# Patient Record
Sex: Male | Born: 1960 | Race: White | Hispanic: No | Marital: Single | Smoking: Never smoker
Health system: Southern US, Community
[De-identification: ages and names within clinical notes are randomized; demographics above are authoritative.]

## PROBLEM LIST (undated history)

## (undated) ENCOUNTER — Ambulatory Visit: Admission: EM | Payer: Medicare Other | Source: Home / Self Care

## (undated) DIAGNOSIS — F209 Schizophrenia, unspecified: Secondary | ICD-10-CM

## (undated) DIAGNOSIS — I10 Essential (primary) hypertension: Secondary | ICD-10-CM

---

## 2004-10-26 ENCOUNTER — Emergency Department: Payer: Self-pay | Admitting: Unknown Physician Specialty

## 2005-03-29 ENCOUNTER — Emergency Department: Payer: Self-pay | Admitting: Emergency Medicine

## 2005-06-23 ENCOUNTER — Inpatient Hospital Stay: Payer: Self-pay | Admitting: Unknown Physician Specialty

## 2009-12-27 ENCOUNTER — Inpatient Hospital Stay: Payer: Self-pay | Admitting: Surgery

## 2010-01-27 ENCOUNTER — Ambulatory Visit: Payer: Self-pay | Admitting: Surgery

## 2010-03-07 ENCOUNTER — Ambulatory Visit: Payer: Self-pay | Admitting: Gastroenterology

## 2014-10-07 DIAGNOSIS — E221 Hyperprolactinemia: Secondary | ICD-10-CM | POA: Insufficient documentation

## 2014-10-07 DIAGNOSIS — Z86010 Personal history of colonic polyps: Secondary | ICD-10-CM | POA: Insufficient documentation

## 2015-10-13 ENCOUNTER — Ambulatory Visit
Admission: RE | Admit: 2015-10-13 | Discharge: 2015-10-13 | Disposition: A | Discharge status: Home / Self Care | Payer: Medicare Other | Source: Ambulatory Visit | Attending: Psychiatry | Admitting: Psychiatry

## 2015-10-13 ENCOUNTER — Other Ambulatory Visit: Payer: Self-pay

## 2015-10-13 DIAGNOSIS — R002 Palpitations: Secondary | ICD-10-CM | POA: Diagnosis not present

## 2016-02-13 DIAGNOSIS — Z79899 Other long term (current) drug therapy: Secondary | ICD-10-CM | POA: Insufficient documentation

## 2016-03-23 ENCOUNTER — Other Ambulatory Visit
Admission: RE | Admit: 2016-03-23 | Discharge: 2016-03-23 | Disposition: A | Discharge status: Home / Self Care | Payer: Medicare Other | Source: Ambulatory Visit | Attending: Child & Adolescent Psychiatry | Admitting: Child & Adolescent Psychiatry

## 2016-03-23 DIAGNOSIS — F209 Schizophrenia, unspecified: Secondary | ICD-10-CM | POA: Diagnosis present

## 2016-03-24 LAB — PROLACTIN: Prolactin: 19.6 ng/mL — ABNORMAL HIGH (ref 4.0–15.2)

## 2017-02-26 DIAGNOSIS — R0602 Shortness of breath: Secondary | ICD-10-CM | POA: Insufficient documentation

## 2017-02-26 DIAGNOSIS — R002 Palpitations: Secondary | ICD-10-CM | POA: Insufficient documentation

## 2017-03-14 DIAGNOSIS — K859 Acute pancreatitis without necrosis or infection, unspecified: Secondary | ICD-10-CM | POA: Insufficient documentation

## 2017-03-14 DIAGNOSIS — I10 Essential (primary) hypertension: Secondary | ICD-10-CM | POA: Insufficient documentation

## 2017-03-16 DIAGNOSIS — R4183 Borderline intellectual functioning: Secondary | ICD-10-CM | POA: Insufficient documentation

## 2018-02-11 DIAGNOSIS — E559 Vitamin D deficiency, unspecified: Secondary | ICD-10-CM | POA: Insufficient documentation

## 2018-02-11 DIAGNOSIS — R7989 Other specified abnormal findings of blood chemistry: Secondary | ICD-10-CM | POA: Insufficient documentation

## 2018-11-20 ENCOUNTER — Encounter: Admission: RE | Payer: Self-pay | Source: Ambulatory Visit

## 2018-11-20 ENCOUNTER — Ambulatory Visit: Admission: RE | Admit: 2018-11-20 | Payer: Medicare Other | Source: Ambulatory Visit | Admitting: Gastroenterology

## 2018-11-20 SURGERY — COLONOSCOPY WITH PROPOFOL
Anesthesia: General

## 2019-02-12 DIAGNOSIS — R251 Tremor, unspecified: Secondary | ICD-10-CM | POA: Insufficient documentation

## 2019-04-25 ENCOUNTER — Other Ambulatory Visit: Payer: Self-pay

## 2019-04-25 ENCOUNTER — Emergency Department: Payer: Medicare Other

## 2019-04-25 ENCOUNTER — Encounter
Admission: EM | Disposition: A | Discharge status: Skilled Nursing Facility | Payer: Self-pay | Source: Home / Self Care | Attending: Surgery

## 2019-04-25 ENCOUNTER — Emergency Department: Payer: Medicare Other | Admitting: Anesthesiology

## 2019-04-25 ENCOUNTER — Inpatient Hospital Stay
Admission: EM | Admit: 2019-04-25 | Discharge: 2019-04-28 | DRG: 501 | Disposition: A | Discharge status: Skilled Nursing Facility | Payer: Medicare Other | Attending: Surgery | Admitting: Surgery

## 2019-04-25 DIAGNOSIS — T796XXA Traumatic ischemia of muscle, initial encounter: Secondary | ICD-10-CM | POA: Diagnosis present

## 2019-04-25 DIAGNOSIS — R000 Tachycardia, unspecified: Secondary | ICD-10-CM | POA: Diagnosis not present

## 2019-04-25 DIAGNOSIS — Z20828 Contact with and (suspected) exposure to other viral communicable diseases: Secondary | ICD-10-CM | POA: Diagnosis present

## 2019-04-25 DIAGNOSIS — Z79899 Other long term (current) drug therapy: Secondary | ICD-10-CM | POA: Diagnosis not present

## 2019-04-25 DIAGNOSIS — R0902 Hypoxemia: Secondary | ICD-10-CM | POA: Diagnosis not present

## 2019-04-25 DIAGNOSIS — R0603 Acute respiratory distress: Secondary | ICD-10-CM | POA: Diagnosis not present

## 2019-04-25 DIAGNOSIS — E8770 Fluid overload, unspecified: Secondary | ICD-10-CM | POA: Diagnosis not present

## 2019-04-25 DIAGNOSIS — F209 Schizophrenia, unspecified: Secondary | ICD-10-CM | POA: Diagnosis present

## 2019-04-25 DIAGNOSIS — Z888 Allergy status to other drugs, medicaments and biological substances status: Secondary | ICD-10-CM

## 2019-04-25 DIAGNOSIS — S76111A Strain of right quadriceps muscle, fascia and tendon, initial encounter: Principal | ICD-10-CM | POA: Diagnosis present

## 2019-04-25 DIAGNOSIS — W109XXA Fall (on) (from) unspecified stairs and steps, initial encounter: Secondary | ICD-10-CM | POA: Diagnosis present

## 2019-04-25 DIAGNOSIS — I10 Essential (primary) hypertension: Secondary | ICD-10-CM | POA: Diagnosis present

## 2019-04-25 DIAGNOSIS — R0602 Shortness of breath: Secondary | ICD-10-CM

## 2019-04-25 DIAGNOSIS — J9811 Atelectasis: Secondary | ICD-10-CM | POA: Diagnosis not present

## 2019-04-25 HISTORY — DX: Essential (primary) hypertension: I10

## 2019-04-25 HISTORY — PX: QUADRICEPS TENDON REPAIR: SHX756

## 2019-04-25 LAB — CBC WITH DIFFERENTIAL/PLATELET
Abs Immature Granulocytes: 0.1 10*3/uL — ABNORMAL HIGH (ref 0.00–0.07)
Basophils Absolute: 0 10*3/uL (ref 0.0–0.1)
Basophils Relative: 0 %
Eosinophils Absolute: 0.2 10*3/uL (ref 0.0–0.5)
Eosinophils Relative: 2 %
HCT: 44.8 % (ref 39.0–52.0)
Hemoglobin: 14.9 g/dL (ref 13.0–17.0)
Immature Granulocytes: 1 %
Lymphocytes Relative: 13 %
Lymphs Abs: 1.1 10*3/uL (ref 0.7–4.0)
MCH: 31.5 pg (ref 26.0–34.0)
MCHC: 33.3 g/dL (ref 30.0–36.0)
MCV: 94.7 fL (ref 80.0–100.0)
Monocytes Absolute: 1 10*3/uL (ref 0.1–1.0)
Monocytes Relative: 13 %
Neutro Abs: 5.5 10*3/uL (ref 1.7–7.7)
Neutrophils Relative %: 71 %
Platelets: 134 10*3/uL — ABNORMAL LOW (ref 150–400)
RBC: 4.73 MIL/uL (ref 4.22–5.81)
RDW: 13.9 % (ref 11.5–15.5)
WBC: 7.9 10*3/uL (ref 4.0–10.5)
nRBC: 0 % (ref 0.0–0.2)

## 2019-04-25 LAB — URINALYSIS, COMPLETE (UACMP) WITH MICROSCOPIC
Bacteria, UA: NONE SEEN
Bilirubin Urine: NEGATIVE
Glucose, UA: NEGATIVE mg/dL
Hgb urine dipstick: NEGATIVE
Ketones, ur: NEGATIVE mg/dL
Leukocytes,Ua: NEGATIVE
Nitrite: NEGATIVE
Protein, ur: NEGATIVE mg/dL
Specific Gravity, Urine: 1.027 (ref 1.005–1.030)
pH: 5 (ref 5.0–8.0)

## 2019-04-25 LAB — BASIC METABOLIC PANEL
Anion gap: 8 (ref 5–15)
BUN: 18 mg/dL (ref 6–20)
CO2: 30 mmol/L (ref 22–32)
Calcium: 9 mg/dL (ref 8.9–10.3)
Chloride: 98 mmol/L (ref 98–111)
Creatinine, Ser: 1.1 mg/dL (ref 0.61–1.24)
GFR calc Af Amer: >60 mL/min (ref >60)
GFR calc non Af Amer: >60 mL/min (ref >60)
Glucose, Bld: 110 mg/dL — ABNORMAL HIGH (ref 70–99)
Potassium: 4.6 mmol/L (ref 3.5–5.1)
Sodium: 136 mmol/L (ref 135–145)

## 2019-04-25 LAB — SARS CORONAVIRUS 2 BY RT PCR (HOSPITAL ORDER, PERFORMED IN ~~LOC~~ HOSPITAL LAB): SARS Coronavirus 2: NEGATIVE

## 2019-04-25 LAB — CK: Total CK: 528 U/L — ABNORMAL HIGH (ref 49–397)

## 2019-04-25 SURGERY — REPAIR, TENDON, QUADRICEPS
Anesthesia: General | Site: Knee | Laterality: Right

## 2019-04-25 MED ORDER — ACETAMINOPHEN 10 MG/ML IV SOLN
INTRAVENOUS | Status: DC | PRN
  Administered 2019-04-25: 1000 mg via INTRAVENOUS

## 2019-04-25 MED ORDER — FENTANYL CITRATE (PF) 100 MCG/2ML IJ SOLN: 25.0000 ug | INTRAMUSCULAR | Status: DC | PRN

## 2019-04-25 MED ORDER — SODIUM CHLORIDE 0.9 % IV BOLUS
1000.0000 mL | Freq: Once | INTRAVENOUS | Status: AC
  Administered 2019-04-25: 12:00:00 1000 mL via INTRAVENOUS

## 2019-04-25 MED ORDER — SUCCINYLCHOLINE CHLORIDE 20 MG/ML IJ SOLN
INTRAMUSCULAR | Status: DC | PRN
  Administered 2019-04-25: 120 mg via INTRAVENOUS

## 2019-04-25 MED ORDER — BENZTROPINE MESYLATE 1 MG PO TABS
1.0000 mg | ORAL_TABLET | Freq: Two times a day (BID) | ORAL | Status: DC
  Administered 2019-04-25 – 2019-04-28 (×5): 1 mg via ORAL
  Filled 2019-04-25 (×7): qty 1

## 2019-04-25 MED ORDER — NALOXONE HCL 0.4 MG/ML IJ SOLN
INTRAMUSCULAR | Status: DC | PRN
  Administered 2019-04-25: 0.1 mg via INTRAVENOUS

## 2019-04-25 MED ORDER — BUPIVACAINE HCL (PF) 0.5 % IJ SOLN
INTRAMUSCULAR | Status: AC
  Filled 2019-04-25: qty 30

## 2019-04-25 MED ORDER — ONDANSETRON HCL 4 MG PO TABS: 4.0000 mg | ORAL_TABLET | Freq: Four times a day (QID) | ORAL | Status: DC | PRN

## 2019-04-25 MED ORDER — SODIUM CHLORIDE 0.9 % IV SOLN
INTRAVENOUS | Status: DC
  Administered 2019-04-25 – 2019-04-26 (×3): via INTRAVENOUS

## 2019-04-25 MED ORDER — DIVALPROEX SODIUM 500 MG PO DR TAB
1000.0000 mg | DELAYED_RELEASE_TABLET | Freq: Two times a day (BID) | ORAL | Status: DC
  Administered 2019-04-25 – 2019-04-28 (×6): 1000 mg via ORAL
  Filled 2019-04-25 (×7): qty 2

## 2019-04-25 MED ORDER — DIPHENHYDRAMINE HCL 12.5 MG/5ML PO ELIX
12.5000 mg | ORAL_SOLUTION | ORAL | Status: DC | PRN
  Filled 2019-04-25: qty 10

## 2019-04-25 MED ORDER — BISACODYL 10 MG RE SUPP
10.0000 mg | Freq: Every day | RECTAL | Status: DC | PRN
  Filled 2019-04-25: qty 1

## 2019-04-25 MED ORDER — ROCURONIUM BROMIDE 100 MG/10ML IV SOLN
INTRAVENOUS | Status: DC | PRN
  Administered 2019-04-25: 5 mg via INTRAVENOUS

## 2019-04-25 MED ORDER — FLUMAZENIL 0.5 MG/5ML IV SOLN
INTRAVENOUS | Status: DC | PRN
  Administered 2019-04-25: 0.2 mg via INTRAVENOUS

## 2019-04-25 MED ORDER — DEXAMETHASONE SODIUM PHOSPHATE 10 MG/ML IJ SOLN
INTRAMUSCULAR | Status: DC | PRN
  Administered 2019-04-25: 10 mg via INTRAVENOUS

## 2019-04-25 MED ORDER — HYDROMORPHONE HCL 1 MG/ML IJ SOLN: 0.2500 mg | INTRAMUSCULAR | Status: DC | PRN

## 2019-04-25 MED ORDER — ONDANSETRON HCL 4 MG/2ML IJ SOLN: 4.0000 mg | Freq: Once | INTRAMUSCULAR | Status: DC | PRN

## 2019-04-25 MED ORDER — TRAMADOL HCL 50 MG PO TABS: 50.0000 mg | ORAL_TABLET | Freq: Four times a day (QID) | ORAL | Status: DC | PRN

## 2019-04-25 MED ORDER — KETOROLAC TROMETHAMINE 15 MG/ML IJ SOLN
15.0000 mg | Freq: Four times a day (QID) | INTRAMUSCULAR | Status: AC
  Administered 2019-04-26 (×3): 15 mg via INTRAVENOUS
  Filled 2019-04-25 (×4): qty 1

## 2019-04-25 MED ORDER — FENTANYL CITRATE (PF) 100 MCG/2ML IJ SOLN
INTRAMUSCULAR | Status: AC
  Filled 2019-04-25: qty 2

## 2019-04-25 MED ORDER — MIDAZOLAM HCL 2 MG/2ML IJ SOLN
INTRAMUSCULAR | Status: DC | PRN
  Administered 2019-04-25: 2 mg via INTRAVENOUS

## 2019-04-25 MED ORDER — PROPOFOL 10 MG/ML IV BOLUS
INTRAVENOUS | Status: DC | PRN
  Administered 2019-04-25: 160 mg via INTRAVENOUS

## 2019-04-25 MED ORDER — PROPOFOL 10 MG/ML IV BOLUS
INTRAVENOUS | Status: AC
  Filled 2019-04-25: qty 20

## 2019-04-25 MED ORDER — FLEET ENEMA 7-19 GM/118ML RE ENEM: 1.0000 | ENEMA | Freq: Once | RECTAL | Status: DC | PRN

## 2019-04-25 MED ORDER — ONDANSETRON HCL 4 MG/2ML IJ SOLN
INTRAMUSCULAR | Status: AC
  Filled 2019-04-25: qty 2

## 2019-04-25 MED ORDER — LORAZEPAM 0.5 MG PO TABS
0.5000 mg | ORAL_TABLET | Freq: Every morning | ORAL | Status: DC
  Administered 2019-04-26 – 2019-04-28 (×3): 0.5 mg via ORAL
  Filled 2019-04-25 (×3): qty 1

## 2019-04-25 MED ORDER — OXYCODONE HCL 5 MG PO TABS
5.0000 mg | ORAL_TABLET | ORAL | Status: DC | PRN
  Administered 2019-04-26 – 2019-04-27 (×2): 10 mg via ORAL
  Filled 2019-04-25: qty 2
  Filled 2019-04-25: qty 1
  Filled 2019-04-25: qty 2

## 2019-04-25 MED ORDER — ENOXAPARIN SODIUM 40 MG/0.4ML ~~LOC~~ SOLN
40.0000 mg | SUBCUTANEOUS | Status: DC
  Administered 2019-04-26 – 2019-04-28 (×3): 40 mg via SUBCUTANEOUS
  Filled 2019-04-25 (×3): qty 0.4

## 2019-04-25 MED ORDER — BENZTROPINE MESYLATE 0.5 MG PO TABS
0.5000 mg | ORAL_TABLET | Freq: Every day | ORAL | Status: DC
  Administered 2019-04-26 – 2019-04-28 (×4): 0.5 mg via ORAL
  Filled 2019-04-25 (×4): qty 1

## 2019-04-25 MED ORDER — DOCUSATE SODIUM 100 MG PO CAPS
100.0000 mg | ORAL_CAPSULE | Freq: Two times a day (BID) | ORAL | Status: DC
  Administered 2019-04-25 – 2019-04-27 (×3): 100 mg via ORAL
  Filled 2019-04-25 (×5): qty 1

## 2019-04-25 MED ORDER — METOCLOPRAMIDE HCL 5 MG PO TABS: 5.0000 mg | ORAL_TABLET | Freq: Three times a day (TID) | ORAL | Status: DC | PRN

## 2019-04-25 MED ORDER — METRONIDAZOLE 0.75 % EX GEL
1.0000 "application " | Freq: Every day | CUTANEOUS | Status: DC
  Administered 2019-04-26 – 2019-04-28 (×3): 1 via TOPICAL
  Filled 2019-04-25: qty 45

## 2019-04-25 MED ORDER — PROPRANOLOL HCL 20 MG PO TABS
20.0000 mg | ORAL_TABLET | Freq: Every day | ORAL | Status: DC
  Administered 2019-04-27 – 2019-04-28 (×2): 20 mg via ORAL
  Filled 2019-04-25 (×4): qty 1

## 2019-04-25 MED ORDER — CEFAZOLIN SODIUM-DEXTROSE 2-4 GM/100ML-% IV SOLN
2.0000 g | Freq: Four times a day (QID) | INTRAVENOUS | Status: AC
  Administered 2019-04-26 (×2): 2 g via INTRAVENOUS
  Filled 2019-04-25 (×3): qty 100

## 2019-04-25 MED ORDER — ROCURONIUM BROMIDE 50 MG/5ML IV SOLN
INTRAVENOUS | Status: AC
  Filled 2019-04-25: qty 1

## 2019-04-25 MED ORDER — LIDOCAINE HCL (CARDIAC) PF 100 MG/5ML IV SOSY
PREFILLED_SYRINGE | INTRAVENOUS | Status: DC | PRN
  Administered 2019-04-25: 40 mg via INTRAVENOUS

## 2019-04-25 MED ORDER — SEVOFLURANE IN SOLN
RESPIRATORY_TRACT | Status: AC
  Filled 2019-04-25: qty 250

## 2019-04-25 MED ORDER — SUGAMMADEX SODIUM 500 MG/5ML IV SOLN
INTRAVENOUS | Status: DC | PRN
  Administered 2019-04-25: 250 mg via INTRAVENOUS

## 2019-04-25 MED ORDER — ONDANSETRON HCL 4 MG/2ML IJ SOLN
INTRAMUSCULAR | Status: DC | PRN
  Administered 2019-04-25: 4 mg via INTRAVENOUS

## 2019-04-25 MED ORDER — ONDANSETRON HCL 4 MG/2ML IJ SOLN: 4.0000 mg | Freq: Four times a day (QID) | INTRAMUSCULAR | Status: DC | PRN

## 2019-04-25 MED ORDER — METOCLOPRAMIDE HCL 5 MG/ML IJ SOLN: 5.0000 mg | Freq: Three times a day (TID) | INTRAMUSCULAR | Status: DC | PRN

## 2019-04-25 MED ORDER — KETOROLAC TROMETHAMINE 30 MG/ML IJ SOLN
INTRAMUSCULAR | Status: AC
  Filled 2019-04-25: qty 1

## 2019-04-25 MED ORDER — BUPIVACAINE HCL 0.5 % IJ SOLN
INTRAMUSCULAR | Status: DC | PRN
  Administered 2019-04-25: 30 mL

## 2019-04-25 MED ORDER — LORAZEPAM 1 MG PO TABS
1.0000 mg | ORAL_TABLET | Freq: Two times a day (BID) | ORAL | Status: DC
  Administered 2019-04-25 – 2019-04-28 (×5): 1 mg via ORAL
  Filled 2019-04-25 (×6): qty 1

## 2019-04-25 MED ORDER — ACETAMINOPHEN 325 MG PO TABS
325.0000 mg | ORAL_TABLET | Freq: Four times a day (QID) | ORAL | Status: DC | PRN
  Administered 2019-04-27: 19:00:00 650 mg via ORAL
  Filled 2019-04-25: qty 2

## 2019-04-25 MED ORDER — CLONAZEPAM 0.5 MG PO TABS: 0.5000 mg | ORAL_TABLET | Freq: Three times a day (TID) | ORAL | Status: DC | PRN

## 2019-04-25 MED ORDER — FENTANYL CITRATE (PF) 100 MCG/2ML IJ SOLN
INTRAMUSCULAR | Status: DC | PRN
  Administered 2019-04-25: 50 ug via INTRAVENOUS
  Administered 2019-04-25 (×2): 25 ug via INTRAVENOUS
  Administered 2019-04-25: 50 ug via INTRAVENOUS

## 2019-04-25 MED ORDER — OLANZAPINE 7.5 MG PO TABS
15.0000 mg | ORAL_TABLET | Freq: Every day | ORAL | Status: DC
  Administered 2019-04-26 – 2019-04-28 (×3): 15 mg via ORAL
  Filled 2019-04-25 (×4): qty 2

## 2019-04-25 MED ORDER — ACETAMINOPHEN 500 MG PO TABS
1000.0000 mg | ORAL_TABLET | Freq: Four times a day (QID) | ORAL | Status: AC
  Administered 2019-04-26 (×3): 1000 mg via ORAL
  Filled 2019-04-25 (×3): qty 2

## 2019-04-25 MED ORDER — MAGNESIUM HYDROXIDE 400 MG/5ML PO SUSP
30.0000 mL | Freq: Every day | ORAL | Status: DC | PRN
  Filled 2019-04-25: qty 30

## 2019-04-25 MED ORDER — MIDAZOLAM HCL 2 MG/2ML IJ SOLN
INTRAMUSCULAR | Status: AC
  Filled 2019-04-25: qty 2

## 2019-04-25 MED ORDER — LACTATED RINGERS IV SOLN
INTRAVENOUS | Status: DC | PRN
  Administered 2019-04-25: 15:00:00 via INTRAVENOUS

## 2019-04-25 MED ORDER — DEXAMETHASONE SODIUM PHOSPHATE 10 MG/ML IJ SOLN
INTRAMUSCULAR | Status: AC
  Filled 2019-04-25: qty 1

## 2019-04-25 MED ORDER — SUCCINYLCHOLINE CHLORIDE 20 MG/ML IJ SOLN
INTRAMUSCULAR | Status: AC
  Filled 2019-04-25: qty 1

## 2019-04-25 MED ORDER — KETOROLAC TROMETHAMINE 30 MG/ML IJ SOLN
30.0000 mg | Freq: Once | INTRAMUSCULAR | Status: AC
  Administered 2019-04-25: 30 mg via INTRAVENOUS

## 2019-04-25 MED ORDER — SERTRALINE HCL 50 MG PO TABS
100.0000 mg | ORAL_TABLET | Freq: Two times a day (BID) | ORAL | Status: DC
  Administered 2019-04-25 – 2019-04-28 (×6): 100 mg via ORAL
  Filled 2019-04-25 (×6): qty 2

## 2019-04-25 MED ORDER — ACETAMINOPHEN 325 MG PO TABS
650.0000 mg | ORAL_TABLET | Freq: Once | ORAL | Status: AC
  Administered 2019-04-25: 650 mg via ORAL
  Filled 2019-04-25: qty 2

## 2019-04-25 MED ORDER — DEXTROSE 5 % IV SOLN
3.0000 g | Freq: Once | INTRAVENOUS | Status: AC
  Administered 2019-04-25: 15:00:00 3 g via INTRAVENOUS
  Filled 2019-04-25: qty 3

## 2019-04-25 SURGICAL SUPPLY — 53 items
ANCHOR SUT 5.0 TWINFIX 2 ULBRD (Anchor) ×9 IMPLANT
BANDAGE ACE 6X5 VEL STRL LF (GAUZE/BANDAGES/DRESSINGS) ×3 IMPLANT
BLADE SURG SZ10 CARB STEEL (BLADE) ×6 IMPLANT
BNDG COHESIVE 4X5 TAN STRL (GAUZE/BANDAGES/DRESSINGS) ×3 IMPLANT
BNDG ESMARK 6X12 TAN STRL LF (GAUZE/BANDAGES/DRESSINGS) ×3 IMPLANT
BRACE KNEE POST OP SHORT (BRACE) ×3 IMPLANT
CANISTER SUCT 1200ML W/VALVE (MISCELLANEOUS) ×3 IMPLANT
CHLORAPREP W/TINT 26 (MISCELLANEOUS) ×3 IMPLANT
COVER WAND RF STERILE (DRAPES) IMPLANT
CUFF TOURN 24 STER (MISCELLANEOUS) IMPLANT
CUFF TOURN 30 STER DUAL PORT (MISCELLANEOUS) ×3 IMPLANT
DRAPE IMP U-DRAPE 54X76 (DRAPES) ×6 IMPLANT
DRAPE INCISE IOBAN 66X45 STRL (DRAPES) ×3 IMPLANT
DRAPE SHEET LG 3/4 BI-LAMINATE (DRAPES) ×3 IMPLANT
DRAPE SURG 17X11 SM STRL (DRAPES) ×6 IMPLANT
DRAPE U-SHAPE 47X51 STRL (DRAPES) ×6 IMPLANT
DRSG OPSITE POSTOP 4X6 (GAUZE/BANDAGES/DRESSINGS) IMPLANT
DRSG OPSITE POSTOP 4X8 (GAUZE/BANDAGES/DRESSINGS) ×3 IMPLANT
ELECT REM PT RETURN 9FT ADLT (ELECTROSURGICAL) ×3
ELECTRODE REM PT RTRN 9FT ADLT (ELECTROSURGICAL) ×1 IMPLANT
GAUZE PETRO XEROFOAM 1X8 (MISCELLANEOUS) IMPLANT
GAUZE SPONGE 4X4 12PLY STRL (GAUZE/BANDAGES/DRESSINGS) IMPLANT
GLOVE BIO SURGEON STRL SZ8 (GLOVE) ×6 IMPLANT
GLOVE INDICATOR 8.0 STRL GRN (GLOVE) ×6 IMPLANT
GOWN STRL REUS W/ TWL LRG LVL3 (GOWN DISPOSABLE) ×1 IMPLANT
GOWN STRL REUS W/ TWL XL LVL3 (GOWN DISPOSABLE) ×1 IMPLANT
GOWN STRL REUS W/TWL LRG LVL3 (GOWN DISPOSABLE) ×2
GOWN STRL REUS W/TWL XL LVL3 (GOWN DISPOSABLE) ×2
HANDLE YANKAUER SUCT BULB TIP (MISCELLANEOUS) ×3 IMPLANT
IMMBOLIZER KNEE 19 BLUE UNIV (SOFTGOODS) IMPLANT
KIT TURNOVER KIT A (KITS) ×3 IMPLANT
NDL MAYO CATGUT SZ1 (NEEDLE) ×3
NDL MAYO CATGUT SZ5 (NEEDLE) ×2
NDL SUT 5 .5 CRC TPR PNT MAYO (NEEDLE) ×1 IMPLANT
NEEDLE MAYO CATGUT SZ1 (NEEDLE) ×1 IMPLANT
NS IRRIG 1000ML POUR BTL (IV SOLUTION) ×3 IMPLANT
PACK EXTREMITY ARMC (MISCELLANEOUS) ×3 IMPLANT
PAD CAST CTTN 4X4 STRL (SOFTGOODS) IMPLANT
PADDING CAST COTTON 4X4 STRL (SOFTGOODS)
RETRIEVER SUT HEWSON (MISCELLANEOUS) IMPLANT
SPONGE LAP 18X18 RF (DISPOSABLE) ×3 IMPLANT
STAPLER SKIN PROX 35W (STAPLE) ×3 IMPLANT
STOCKINETTE IMPERVIOUS 9X36 MD (GAUZE/BANDAGES/DRESSINGS) ×3 IMPLANT
SUT ETHIBOND #5 BRAIDED 30INL (SUTURE) ×3 IMPLANT
SUT FIBERWIRE #2 38 BLUE 1/2 (SUTURE)
SUT VIC AB 0 CT1 36 (SUTURE) ×6 IMPLANT
SUT VIC AB 2-0 CT1 27 (SUTURE) ×4
SUT VIC AB 2-0 CT1 TAPERPNT 27 (SUTURE) ×2 IMPLANT
SUT VIC AB 2-0 CT2 27 (SUTURE) IMPLANT
SUT VIC AB 3-0 SH 27 (SUTURE)
SUT VIC AB 3-0 SH 27X BRD (SUTURE) IMPLANT
SUTURE FIBERWR #2 38 BLUE 1/2 (SUTURE) IMPLANT
TOWEL OR 17X26 4PK STRL BLUE (TOWEL DISPOSABLE) ×3 IMPLANT

## 2019-04-25 NOTE — ED Notes (Signed)
Spoke with patient's sister in law Candelero Abajo- informed her that the patient would be going for surgery this afternoon.  Dr. Joice Lofts spoke with patient and obtained consent. [name] also agreeable with surgery via telephone.

## 2019-04-25 NOTE — Transfer of Care (Signed)
Immediate Anesthesia Transfer of Care Note  Patient: Ethan Alvarez  Procedure(s) Performed: REPAIR QUADRICEP TENDON (Right Knee)  Patient Location: PACU  Anesthesia Type:General  Level of Consciousness: awake  Airway & Oxygen Therapy: Patient Spontanous Breathing and Patient connected to face mask oxygen  Post-op Assessment: Report given to RN and Post -op Vital signs reviewed and stable  Post vital signs: Reviewed and stable  Last Vitals:  Vitals Value Taken Time  BP 148/80 04/25/2019  5:31 PM  Temp 36.4 C 04/25/2019  5:31 PM  Pulse 103 04/25/2019  5:34 PM  Resp 20 04/25/2019  5:34 PM  SpO2 96 % 04/25/2019  5:34 PM  Vitals shown include unvalidated device data.  Last Pain:  Vitals:   04/25/19 1223  TempSrc:   PainSc: 5          Complications: No apparent anesthesia complications

## 2019-04-25 NOTE — Progress Notes (Signed)
15 minute call to floor. 

## 2019-04-25 NOTE — ED Notes (Signed)
Returned from XR 

## 2019-04-25 NOTE — Anesthesia Preprocedure Evaluation (Addendum)
Anesthesia Evaluation  Patient identified by MRN, date of birth, ID band Patient awake    Reviewed: Allergy & Precautions, NPO status , Patient's Chart, lab work & pertinent test results  Airway Mallampati: II  TM Distance: >3 FB     Dental   Pulmonary neg pulmonary ROS,    Pulmonary exam normal        Cardiovascular hypertension, Normal cardiovascular exam     Neuro/Psych Schizophrenia negative neurological ROS     GI/Hepatic negative GI ROS, Neg liver ROS,   Endo/Other  negative endocrine ROS  Renal/GU negative Renal ROS  negative genitourinary   Musculoskeletal   Abdominal Normal abdominal exam  (+)   Peds negative pediatric ROS (+)  Hematology negative hematology ROS (+)   Anesthesia Other Findings Past Medical History: No date: Hypertension  Reproductive/Obstetrics                            Anesthesia Physical Anesthesia Plan  ASA: III and emergent  Anesthesia Plan: General   Post-op Pain Management:    Induction: Intravenous, Rapid sequence and Cricoid pressure planned  PONV Risk Score and Plan:   Airway Management Planned: Oral ETT  Additional Equipment:   Intra-op Plan:   Post-operative Plan:   Informed Consent: I have reviewed the patients History and Physical, chart, labs and discussed the procedure including the risks, benefits and alternatives for the proposed anesthesia with the patient or authorized representative who has indicated his/her understanding and acceptance.     Dental advisory given  Plan Discussed with: CRNA and Surgeon  Anesthesia Plan Comments:        Anesthesia Quick Evaluation

## 2019-04-25 NOTE — ED Notes (Signed)
EDP in room to update patient. 

## 2019-04-25 NOTE — Progress Notes (Signed)
Right pedal pulse, p+2 quality.  Warm to touch and pink in color.

## 2019-04-25 NOTE — ED Notes (Signed)
Pt helped to use urinal in bed and hand hygiene performed after.

## 2019-04-25 NOTE — Anesthesia Post-op Follow-up Note (Signed)
Anesthesia QCDR form completed.        

## 2019-04-25 NOTE — Op Note (Signed)
04/25/2019  5:13 PM  Patient:   Ethan Alvarez  Pre-Op Diagnosis:   Acute quadriceps tendon rupture, right knee.  Post-Op Diagnosis:   Same.  Procedure:   Primary repair of right quadriceps tendon rupture.  Surgeon:   Maryagnes AmosJ. Jeffrey Thimothy Barretta, MD  Assistant:   None  Anesthesia:   GET  Findings:   As above.  Complications:   None  Fluids:   700 cc crystalloid  EBL:   10 cc  TT:   80 minutes at 300 mmHg  Drains:   None  Closure:   Staples  Implants:   [employer] Nephew 5.5 mm TwinFix titanium anchors 3  Brief Clinical Note:   The patient is a 58 year old male who sustained the above-noted injury last evening when he apparently tripped and fell descending a flight of stairs in his home. He was not found to this morning. He was brought to the emergency room where he was diagnosed with an acute rupture of his right quadriceps tendon. The patient presents at this time for definitive management of this injury.  Procedure:   The patient was brought into the operating room and lain in the supine position. After adequate general endotracheal intubation and anesthesia were obtained, the patient's right lower extremity was prepped with ChloraPrep solution before being draped sterilely. Preoperative antibiotics were administered. A timeout was performed to verify the appropriate surgical site before the limb was exsanguinated with an Esmarch and the thigh tourniquet inflated to 300 mmHg. An approximately 4-5 inch incision was made over the anterior aspect of the knee centered at the superior pole of the patella. The incision was carried down through the subcutaneous tissues to expose the superficial retinaculum. This was split the length of the incision to reveal the ruptured quadriceps tendon. Inspection suggested that the tendon had avulsed nearly intact from the superior pole of the patella.    The torn end of the tendon was debrided sharply back to good tissues while the attachment site at the  superior pole of the patella was debrided down to bleeding bone with a rongeur. Three double loaded [employer] Nephew 5.5 mm TwinFix titanium anchors were inserted along the superior margin of the patella after predrilling holes with a 3.5 mm drill bit.  Each of the two sets of #2 FiberWire sutures from each anchor were then woven through the quadriceps tendon in a modified Krakw fashion before tying them securely to effect the repair. The repair was deemed stable with knee flexion to 90.   Each of the two sets of medial and lateral sutures were brought back through the proximal end of the patellar tendon using free needles and tied again to reinforce this repair. In addition, the two sets of #2 FiberWire's placed centrally were tied in a side-to-side fashion to repair the longitudinal split had been placed into the anterior quadriceps tendon tissue to reach the apex of the tendon tear. Finally, the torn portions of the retinaculum medially and laterally were re-approximated using #0 Vicryl interrupted sutures. Again knee flexion was assessed. The repair was shown to be stable with knee flexion to 95, supporting the weight of the lower leg independently in the process.  The wound was copiously irrigated with sterile saline solution using bulb irrigation before the superficial retinacular layer was re-approximated using #0 Vicryl interrupted sutures. The subcutaneous tissues were closed in two layers using 2-0 Vicryl interrupted sutures before the skin was closed using staples. A sterile occlusive dressing was applied to the  knee before the leg was placed into a postoperative hinged knee brace with the hinges set at 0-70, but locked in extension. The patient was then awakened, extubated, and returned to the recovery room in satisfactory condition after tolerating the procedure well.

## 2019-04-25 NOTE — ED Provider Notes (Addendum)
[facility] Emergency Department Provider Note ____________________________________________   First MD Initiated Contact with Patient 04/25/19 0831     (approximate)  I have reviewed the triage vital signs and the nursing notes.   HISTORY  Chief Complaint Fall    HPI Ethan Alvarez is a 58 y.o. male with PMH as noted below who presents with right knee pain, acute onset after he fell while going down the stairs yesterday.  The patient states that he was unable to bear weight on his right leg and therefore could not get up until he was found by caregiver this morning.  He denies hitting his head, and has no other injuries.  He denies any weakness or other medical complaints.  Past Medical History:  Diagnosis Date  . Hypertension     There are no active problems to display for this patient.   History reviewed. No pertinent surgical history.  Prior to Admission medications   Medication Sig Start Date End Date Taking? Authorizing Provider  acetaminophen (TYLENOL) 325 MG tablet Take 650 mg by mouth every 4 (four) hours as needed for mild pain. 03/21/17  Yes [provider]  amoxicillin (AMOXIL) 500 MG capsule Take 500 mg by mouth 3 (three) times daily. 04/21/19  Yes [provider]  benztropine (COGENTIN) 0.5 MG tablet Take 0.5 mg by mouth daily. 03/25/19  Yes [provider]  benztropine (COGENTIN) 1 MG tablet Take 1 mg by mouth 2 (two) times a day. 09/13/14  Yes [provider]  clonazePAM (KLONOPIN) 0.5 MG tablet Take 0.5 mg by mouth 3 (three) times daily as needed for agitation. 04/08/19  Yes [provider]  divalproex (DEPAKOTE) 500 MG DR tablet Take 1,000 mg by mouth 2 (two) times a day. 02/13/17 07/23/19 Yes [provider]  INVEGA SUSTENNA 234 MG/1.5ML SUSY injection Inject 234 mg into the muscle every 30 (thirty) days. 04/10/19  Yes [provider]  LORazepam (ATIVAN) 0.5 MG tablet Take 0.5 mg by  mouth every morning.    Yes [provider]  LORazepam (ATIVAN) 1 MG tablet Take 1 mg by mouth 2 (two) times daily. May take an additional dose as needed for agitation 01/23/19  Yes [provider]  metroNIDAZOLE (METROGEL) 0.75 % gel Apply 1 application topically daily. 04/04/19  Yes [provider]  OLANZapine (ZYPREXA) 15 MG tablet Take 15 mg by mouth daily. 03/25/19 07/23/19 Yes [provider]  propranolol (INDERAL) 20 MG tablet Take 20 mg by mouth daily.  08/12/18  Yes [provider]  sertraline (ZOLOFT) 100 MG tablet Take 100 mg by mouth 2 (two) times a day. 01/30/18 07/23/19 Yes [provider]    Allergies Haloperidol lactate  History reviewed. No pertinent family history.  Social History Social History   Tobacco Use  . Smoking status: Never Smoker  . Smokeless tobacco: Never Used  Substance Use Topics  . Alcohol use: Not on file  . Drug use: Not on file    Review of Systems  Constitutional: No fever.  No weakness. Eyes: No redness. ENT: No sore throat. Cardiovascular: Denies chest pain. Respiratory: Denies shortness of breath. Gastrointestinal: No vomiting or diarrhea.  Genitourinary: Negative for dysuria.  Musculoskeletal: Negative for back pain.  Positive for right knee pain. Skin: Negative for rash. Neurological: Negative for headache.   ____________________________________________   PHYSICAL EXAM:  VITAL SIGNS: ED Triage Vitals  Enc Vitals Group     BP 04/25/19 0830 125/70  Pulse Rate 04/25/19 0830 (!) 108     Resp 04/25/19 0830 14     Temp 04/25/19 0830 97.8 F (36.6 C)     Temp Source 04/25/19 0830 Oral     SpO2 04/25/19 0830 98 %     Weight 04/25/19 0831 267 lb (121.1 kg)     Height 04/25/19 0858 5\' 10"  (1.778 m)     Head Circumference --      Peak Flow --      Pain Score 04/25/19 0829 8     Pain Loc --      Pain Edu? --      Excl. in GC? --     Constitutional: Alert and oriented.   Relatively well appearing and in no acute distress. Eyes: Conjunctivae are normal.  Head: Atraumatic. Nose: No congestion/rhinnorhea. Mouth/Throat: Mucous membranes are moist.   Neck: Normal range of motion.  Cardiovascular: Normal rate, regular rhythm. Good peripheral circulation. Respiratory: Normal respiratory effort.   Gastrointestinal: Soft and nontender. No distention.  Genitourinary: No flank tenderness. Musculoskeletal: No lower extremity edema.  Extremities warm and well perfused.  Right knee with effusion and suprapatellar tenderness.  Flexion decreased to about 90 degrees due to pain.  2+ distal pulse.  No midline spinal tenderness.  Full range of motion at right hip and ankle. Neurologic:  Normal speech and language. No gross focal neurologic deficits are appreciated.  Skin:  Skin is warm and dry. No rash noted. Psychiatric: Mood and affect are normal. Speech and behavior are normal.  ____________________________________________   LABS (all labs ordered are listed, but only abnormal results are displayed)  Labs Reviewed  BASIC METABOLIC PANEL - Abnormal; Notable for the following components:      Result Value   Glucose, Bld 110 (*)    All other components within normal limits  CBC WITH DIFFERENTIAL/PLATELET - Abnormal; Notable for the following components:   Platelets 134 (*)    Abs Immature Granulocytes 0.10 (*)    All other components within normal limits  URINALYSIS, COMPLETE (UACMP) WITH MICROSCOPIC - Abnormal; Notable for the following components:   Color, Urine YELLOW (*)    APPearance HAZY (*)    All other components within normal limits  CK - Abnormal; Notable for the following components:   Total CK 528 (*)    All other components within normal limits  SARS CORONAVIRUS 2 (HOSPITAL ORDER, PERFORMED IN [facility])   ____________________________________________  EKG  ED ECG REPORT I, Dionne Bucy, the attending physician, personally  viewed and interpreted this ECG.  Date: 04/25/2019 EKG Time: 1314 Rate: 93 Rhythm: normal sinus rhythm QRS Axis: normal Intervals: normal ST/T Wave abnormalities: normal Narrative Interpretation: no evidence of acute ischemia  ____________________________________________  RADIOLOGY  XR R knee: Suprapatellar effusion with no acute bony abnormality CT R knee: Patellar tendon rupture  ____________________________________________   PROCEDURES  Procedure(s) performed: No  Procedures  Critical Care performed: No ____________________________________________   INITIAL IMPRESSION / ASSESSMENT AND PLAN / ED COURSE  Pertinent labs & imaging results that were available during my care of the patient were reviewed by me and considered in my medical decision making (see chart for details).  58 year old male with PMH as noted above presents with right knee injury, causing him to fall last night.  He was unable to get up since then.  He was found by caregiver this morning.  I reviewed the past medical records in Epic and Care Everywhere.  The patient has a  history of schizophrenia and hypertension. He has had no recent ED visits or admissions.  On exam he is well-appearing.  He was initially slightly tachycardic but now his vital signs are normal.  He has decreased range of motion of the right knee and an effusion, but no other significant injuries on exam.  X-ray was obtained.  It is negative for obvious fracture, however it does show some bony densities in the suprapatellar region so I ordered a CT to further evaluate and rule out occult fracture.  Although the patient denies any medical complaints, given that he was down for most of the night and was slightly tachycardic, I will obtain basic labs, CK, and a UA to rule out rhabdomyolysis, dehydration, or other acute complications.  ----------------------------------------- 12:31 PM on 04/25/2019  -----------------------------------------  Lab work-up reveals mild rhabdomyolysis but the patient's creatinine is normal.  CT shows suprapatellar tendon rupture.  I consulted Dr. Joice Lofts from orthopedics.  He recommends surgery and agreed to evaluate the patient in the ED and admit him.   ________________________  Ethan Alvarez was evaluated in Emergency Department on 04/25/2019 for the symptoms described in the history of present illness. He was evaluated in the context of the global COVID-19 pandemic, which necessitated consideration that the patient might be at risk for infection with the SARS-CoV-2 virus that causes COVID-19. Institutional protocols and algorithms that pertain to the evaluation of patients at risk for COVID-19 are in a state of rapid change based on information released by regulatory bodies including the CDC and federal and state organizations. These policies and algorithms were followed during the patient's care in the ED.   ____________________________________________   FINAL CLINICAL IMPRESSION(S) / ED DIAGNOSES  Final diagnoses:  Rupture of right quadriceps tendon, initial encounter  Traumatic rhabdomyolysis, initial encounter (HCC)      NEW MEDICATIONS STARTED DURING THIS VISIT:  New Prescriptions   No medications on file     Note:  This document was prepared using Dragon voice recognition software and may include unintentional dictation errors.    Dionne Bucy, MD 04/25/19 [facility] Donath    Dionne Bucy, MD 04/25/19 [phone]

## 2019-04-25 NOTE — ED Notes (Signed)
Pt transported to XR.  

## 2019-04-25 NOTE — ED Notes (Addendum)
Call SISTER IN LAW [phone] regarding discharge and admission.  Spoke with Gunnar Fusi, pt's caregiver to inform her that the patient would be admitted for surgery.  Pt's mother has dementia and requires around the clock care.  Paula caregiver phone [phone]-only call if unable to reach another family member.

## 2019-04-25 NOTE — ED Triage Notes (Signed)
Pt presents via EMS c/o last PM. Pt on floor since last PM. Denies LOC. A&O x4 at this time.

## 2019-04-25 NOTE — Anesthesia Procedure Notes (Signed)
Procedure Name: Intubation Date/Time: 04/25/2019 3:16 PM Performed by: Allean Found, CRNA Pre-anesthesia Checklist: Patient identified, Patient being monitored, Timeout performed, Emergency Drugs available and Suction available Patient Re-evaluated:Patient Re-evaluated prior to induction Oxygen Delivery Method: Circle system utilized Preoxygenation: Pre-oxygenation with 100% oxygen Induction Type: IV induction Ventilation: Mask ventilation without difficulty Laryngoscope Size: Mac and 4 Grade View: Grade I Tube type: Oral Tube size: 7.5 mm Number of attempts: 1 Airway Equipment and Method: Stylet Placement Confirmation: ETT inserted through vocal cords under direct vision,  positive ETCO2 and breath sounds checked- equal and bilateral Secured at: 22.5 cm Tube secured with: Tape Dental Injury: Teeth and Oropharynx as per pre-operative assessment

## 2019-04-25 NOTE — H&P (Signed)
Subjective:  Chief complaint: Right knee pain and swelling.  The patient is a 58 y.o. male who sustained an injury to the right knee last evening when he apparently tripped while running down his cellar stairs and striking his knee against the railing. He was unable to get up without pain or assistance. Because of continued pain and inability to ambulate this morning, he presented to the emergency room where he was diagnosed with a right quadriceps tendon rupture. The patient denies any associated injury. The patient did not strike his head or lose consciousness. The patient also denies any light-headedness, dizziness, chest pain, or shortness of breath which might have contributed to the injury.  There are no active problems to display for this patient.  Past Medical History:  Diagnosis Date  . Hypertension     History reviewed. No pertinent surgical history.  (Not in a hospital admission)  Allergies  Allergen Reactions  . Haloperidol Lactate Other (See Comments)    Per [facility]; nature of intolerance unclear intolerance Other reaction(s): Other (See Comments) Per [facility]; nature of intolerance unclear     Social History   Tobacco Use  . Smoking status: Never Smoker  . Smokeless tobacco: Never Used  Substance Use Topics  . Alcohol use: Not on file    History reviewed. No pertinent family history.   Review of Systems: As noted above. The patient denies any chest pain, shortness of breath, nausea, vomiting, diarrhea, constipation, belly pain, blood in his/her stool, or burning with urination.  Objective: Temp:  [97.8 F (36.6 C)] 97.8 F (36.6 C) (05/09 0830) Pulse Rate:  [94-108] 94 (05/09 1225) Resp:  [14-18] 18 (05/09 1225) BP: (122-126)/(59-70) 122/66 (05/09 1225) SpO2:  [95 %-100 %] 95 % (05/09 1225) Weight:  [121.1 kg] 121.1 kg (05/09 0858)  Physical Exam: General:  Alert, no acute distress Psychiatric:  Patient appears to be competent for consent with normal mood and  affect.  Cardiovascular:  RRR  Respiratory:  Clear to auscultation. No wheezing. Non-labored breathing GI:  Abdomen is soft and non-tender Skin:  No lesions in the area of chief complaint Neurologic:  Sensation intact distally Lymphatic:  No axillary or cervical lymphadenopathy  Orthopedic Exam:  Orthopedic examination is limited to the right knee and lower extremity. The patient is unable to perform an active straight leg raise, and has significant pain with any attempts to do so. Skin inspection around the right knee is unremarkable, other than for mild swelling. There is no erythema, ecchymosis, abrasions, or other skin abnormalities identified. He has a 1-2+ effusion. He has moderate tenderness to palpation over the superior pole of the patella. Palpation also demonstrates a palpable defect in the quadriceps tendon measuring approximately 1-1.5 cm. He has pain with any attempted passive flexion of the knee. The knee is stable to varus and valgus stressing. He is neurovascularly intact to the right lower extremity and foot.  Imaging Review: Recent x-rays of the right knee, as well as a CT scan, are available for review and have been reviewed by myself. These films demonstrate the patella to be located somewhat inferiorly and tilted anteriorly on the lateral view. In addition, there is evidence of small calcifications in the suprapatellar bursal region. These findings are consistent with an acute quadriceps tendon rupture, which was confirmed on the CT scan. No other acute bony abnormalities are identified.  Assessment: Acute right quadriceps tendon rupture.  Plan: The treatment options, including both surgical and nonsurgical choices, have been discussed in detail  with the patient and his family. The risks (including bleeding, infection, nerve and/or blood vessel injury, persistent or recurrent pain, failure of the repair, recurrence of tear, residual leg weakness, need for further surgery,  blood clots, strokes, heart attacks and/or arrhythmias, pneumonia, etc.) and benefits of the surgical procedure were discussed. The patient states his understanding and agrees to proceed. A formal written consent has been obtained and the procedure also has been cleared with his sister-in-law.

## 2019-04-25 NOTE — ED Notes (Signed)
Pt transported to OR, informed pt's sister in law [name] pt going up to the OR at this time. Cefazolin sent to OR with patient.

## 2019-04-25 NOTE — ED Notes (Signed)
Fall last night, right knee pain and swelling present. Pt denies any LOC, was found by caregiver. Pt also has abrasions to left arm, denies any pain to left arm

## 2019-04-26 LAB — BASIC METABOLIC PANEL
Anion gap: 8 (ref 5–15)
BUN: 20 mg/dL (ref 6–20)
CO2: 27 mmol/L (ref 22–32)
Calcium: 7.8 mg/dL — ABNORMAL LOW (ref 8.9–10.3)
Chloride: 102 mmol/L (ref 98–111)
Creatinine, Ser: 1.05 mg/dL (ref 0.61–1.24)
GFR calc Af Amer: >60 mL/min (ref >60)
GFR calc non Af Amer: >60 mL/min (ref >60)
Glucose, Bld: 115 mg/dL — ABNORMAL HIGH (ref 70–99)
Potassium: 4.4 mmol/L (ref 3.5–5.1)
Sodium: 137 mmol/L (ref 135–145)

## 2019-04-26 NOTE — Evaluation (Signed)
Physical Therapy Evaluation Patient Details Name: Ethan Alvarez MRN: [id number] DOB: 1961/07/29 Today's Date: 04/26/2019   History of Present Illness  Ethan Alvarez is a 58 y.o. male arrived at the ED by EMS on 04/25/2019 comlaining of R knee pain and inability to ambulate after an unwitnessed fall going down the stairs 04/24/2019. He was found on the floor by caregiver the morning following the fall. PMH includes schizophrenia and hypertension. CT R knee: Patellar tendon rupture. Patient was admitted to the hospital for R quad tenden rupture and traumatic rhabdomyolysis and he underwent a priomary repair of R quadriceps tendion rupture was performed 04/25/2019.  Partial weight bearing up to 50% on R LE with brace locked in extension.     Clinical Impression  Patient is alert and cooperative. Oriented to person, place, and time. Some confusion noted with situation and appears to have mild cognitive deficits resembling developmental delay. Patient reports that prior to hospitalization, he lived with his mother who requires physical assistance and has some caregivers of her own. Patient reports he lives in basement where he has a bathroom with a walk-in shower and a kitchenette. He reports he could live on main floor with his mother where there is a bathroom with a tub/shower combo. He reports no DME available to him. He reports prior to hospitalization, he was I with ambulation and stairs with no AD but required assistance with dressing, bathing, and meal prep. Upon physical therapy evaluation, he required min A for supine to sit bed mobility with HOB elevated, min A for STS transfers to RW, and was unable to ambulate safely at this time due to difficulty advancing left LE without breaking WB restrictions on RLE. He would benefit from +2 assistance for equipment and to ensure maintenance of WB restrictions. Due to patient's significant decline in functional independence, patient would benefit from short term  rehab upon hospital discharge prior to going home. He has stated his preference is [facility], where he has gone before. Patient would benefit from physical therapy to address impairments and functional limitations to work towards  return to PLOF or maximal functional independence.      Follow Up Recommendations SNF    Equipment Recommendations  Rolling walker with 5" wheels;3in1 (PT);Wheelchair (measurements PT);Wheelchair cushion (measurements PT)    Recommendations for Other Services OT consult     Precautions / Restrictions Precautions Precautions: Fall Restrictions Weight Bearing Restrictions: Yes RLE Weight Bearing: Partial weight bearing RLE Partial Weight Bearing Percentage or Pounds: 50% with brace locked in extension      Mobility  Bed Mobility Overal bed mobility: Needs Assistance Bed Mobility: Supine to Sit     Supine to sit: Min assist     General bed mobility comments: hand held assistance to come to sitting; assistance to abduct R leg: able to lift R leg off bed: patient apprehensive and rquired encouragement.   Transfers Overall transfer level: Needs assistance Equipment used: Rolling walker (2 wheeled)(bariatric walker) Transfers: Sit to/from Stand Sit to Stand: Min assist;From elevated surface         General transfer comment: Patient completed STS to/from elevated bed using RW and min A. pt transfered elevated bed to chair with prolonged time between each attempt to move feet. patient struggled with moving left LE while maintaining R WB precautions and was only able to move L foot minimally each attempt. Brought chair up closer. Would benefit from second person to assist with equipment and monitoring/assisting R WB status.  Ambulation/Gait             General Gait Details: Great difficulty moving left foot to complete STS transfer to chair; further gait not appropriate at this time. Will required two people to maintain WB precautions.    Stairs            Wheelchair Mobility    Modified Rankin (Stroke Patients Only)       Balance Overall balance assessment: Needs assistance Sitting-balance support: Bilateral upper extremity supported;Feet supported Sitting balance-Leahy Scale: Fair Sitting balance - Comments: steady sitting at EOB with BUE support     Standing balance-Leahy Scale: Poor Standing balance comment: Dependent on RW to maintain standing balance                             Pertinent Vitals/Pain Pain Assessment: Faces Pain Score: 10-Worst pain ever Faces Pain Scale: Hurts a little bit Pain Location: right knee; patient shows minimal signs of distress; nursing confirms that he may struggle with processing ratings on scale Pain Intervention(s): Limited activity within patient's tolerance;Monitored during session;Patient requesting pain meds-RN notified;Ice applied    Home Living Family/patient expects to be discharged to:: Private residence Living Arrangements: Parent(lives with mother who requires physical assistance) Available Help at Discharge: Personal care attendant;Available PRN/intermittently Type of Home: House Home Access: Level entry     Home Layout: Multi-level;Full bath on main level;Able to live on main level with bedroom/bathroom;Other (Comment)(patient lives in basement) Home Equipment: None Additional Comments: Patient is A&O to person, place, and time, and partially oriented to situation. He demonstrates evidence of cognitive impairment consistent with developmental delay. He may not be a reliable historian. Patient reports he lives in 3 story home with his mother who requires his physical assistance for care. He lives in the basement, where he has a bathroom with a walk-in shower and a kitchinette. He states his mother lives on the main level. He reports he could stay on the main level where there is a bathroom with tub/shower combo and a place to sleep. He reports no  steps to enter the home, but 6 steps down to the basement and a third floor. He states he does not have a shower seat in either bathroom and no other DME. He states his mother requires his physical assistance, but then reports she has caregivers that come to the home.     Prior Function Level of Independence: Needs assistance   Gait / Transfers Assistance Needed: ambulated I without AD  ADL's / Homemaking Assistance Needed: required help with dressing, bathing, and meal prep. Reports someone came to assist him with these.   Comments: Patient reports prior to hospitalization he was I with ambulation and stairs. He reports he required help with meal prep, dressing, and bathing.      Hand Dominance        Extremity/Trunk Assessment   Upper Extremity Assessment Upper Extremity Assessment: Generalized weakness    Lower Extremity Assessment Lower Extremity Assessment: Generalized weakness;RLE deficits/detail RLE Deficits / Details: R limited by knee brace locked into extension; PWB 50% RLE: Unable to fully assess due to immobilization    Cervical / Trunk Assessment Cervical / Trunk Assessment: Normal  Communication   Communication: Other (comment)(evidence of cognitive impairment consistent with developmental delay)  Cognition Arousal/Alertness: Awake/alert Behavior During Therapy: WFL for tasks assessed/performed Overall Cognitive Status: No family/caregiver present to determine baseline cognitive functioning  General Comments: Alert and oriented  to self, time, and location; cannot remember that he is not to bend knee and that brace is locked in extension; appears to have cognitive deficits resembling developmental delay, likely at baseline.       General Comments      Exercises Other Exercises Other Exercises: practiced/educated on proper sequencing for transfers, use of RW, and WB precautions.    Assessment/Plan    PT  Assessment Patient needs continued PT services  PT Problem List Decreased strength;Decreased mobility;Decreased safety awareness;Decreased range of motion;Decreased coordination;Decreased knowledge of precautions;Decreased activity tolerance;Decreased cognition;Cardiopulmonary status limiting activity;Decreased skin integrity;Decreased balance;Decreased knowledge of use of DME;Pain;Obesity       PT Treatment Interventions DME instruction;Functional mobility training;Balance training;Patient/family education;Gait training;Therapeutic activities;Neuromuscular re-education;Stair training;Therapeutic exercise;Wheelchair mobility training    PT Goals (Current goals can be found in the Care Plan section)  Acute Rehab PT Goals Patient Stated Goal: go to [facility] SNF for rehab, then return home PT Goal Formulation: With patient Time For Goal Achievement: 05/10/19 Potential to Achieve Goals: Good    Frequency BID   Barriers to discharge Decreased caregiver support patient will need physical assistance and increased supervision    Co-evaluation               AM-PAC PT "6 Clicks" Mobility  Outcome Measure Help needed turning from your back to your side while in a flat bed without using bedrails?: A Little Help needed moving from lying on your back to sitting on the side of a flat bed without using bedrails?: A Little Help needed moving to and from a bed to a chair (including a wheelchair)?: A Lot Help needed standing up from a chair using your arms (e.g., wheelchair or bedside chair)?: A Lot Help needed to walk in hospital room?: Total Help needed climbing 3-5 steps with a railing? : Total 6 Click Score: 12    End of Session Equipment Utilized During Treatment: Gait belt;Oxygen(2L/min start of visit; removed prior to second transfer with SpO2 maintained at least 93%) Activity Tolerance: Patient tolerated treatment well;Patient limited by fatigue Patient left: in chair;with call  bell/phone within reach;with chair alarm set;with nursing/sitter in room;with SCD's reapplied Nurse Communication: Mobility status;Weight bearing status;Patient requests pain meds(SpO2 and O2 delivery; RN agreed to keep O2 off after session) PT Visit Diagnosis: Unsteadiness on feet (R26.81);History of falling (Z91.81);Muscle weakness (generalized) (M62.81);Difficulty in walking, not elsewhere classified (R26.2)    Time: 0922-1000 PT Time Calculation (min) (ACUTE ONLY): 38 min   Charges:   PT Evaluation $PT Eval Moderate Complexity: 1 Mod PT Treatments $Therapeutic Activity: 23-37 mins        Luretha Murphy. Ilsa Iha, PT, DPT 04/26/19, 10:39 AM

## 2019-04-26 NOTE — Progress Notes (Signed)
Physical Therapy Treatment Patient Details Name: Ethan Alvarez MRN: [id number] DOB: 1961/04/27 Today's Date: 04/26/2019    History of Present Illness Ethan Alvarez is a 58 y.o. male arrived at the ED by EMS on 04/25/2019 comlaining of R knee pain and inability to ambulate after an unwitnessed fall going down the stairs 04/24/2019. He was found on the floor by caregiver the morning following the fall. PMH includes schizophrenia and hypertension. CT R knee: Patellar tendon rupture. Patient was admitted to the hospital for R quad tenden rupture and traumatic rhabdomyolysis and he underwent a priomary repair of R quadriceps tendion rupture was performed 04/25/2019.  Partial weight bearing up to 50% on R LE with brace locked in extension.     PT Comments    Patient seated in chair, states he needs to have BM and urinate. No complaints of pain. Patient transferred chair to [facility] with mod A and RW with heavy cuing for hand placement and to maintain WB precautions. Attempted 180 degree turn, but patient went the opposite way of cuing and required BSC to be brought closer due to early fatigue. Required increased physical assist to slow descent when performing stand to sit. BSC was too small for patient to use for urination and BM at the same time, so patient was assisted with urinal while sitting. Patient was unable to provide a consistent urine flow and requested condom catheter. Patient was unable to have a BM. Obtained nursing assist to manage equipment during [facility] to bed STS transfer with mid A +2 and RW. During transfers, patient fatigued before completely lining up with second surface, necessitating moving the surface closer or strong encouragement to maintain WB precautions and continue movement. Patient required mod A +2 to transfer sit to supine and scoot up in bed. Ended session with nursing attending to pt needs in bed. Patient would benefit from continued physical therapy to address remaining impairments and  functional limitations to work towards stated goals and return to PLOF or maximal functional independence.       Follow Up Recommendations  SNF     Equipment Recommendations  Rolling walker with 5" wheels;3in1 (PT);Wheelchair (measurements PT);Wheelchair cushion (measurements PT)    Recommendations for Other Services OT consult     Precautions / Restrictions Precautions Precautions: Fall Restrictions Weight Bearing Restrictions: Yes RLE Weight Bearing: Partial weight bearing RLE Partial Weight Bearing Percentage or Pounds: 50% with brace locked in extension    Mobility  Bed Mobility Overal bed mobility: Needs Assistance Bed Mobility: Sit to Supine     Supine to sit: Min assist Sit to supine: Mod assist   General bed mobility comments: required help with legs and trunk; scoot up bed mod A +2 with draw sheet  Transfers Overall transfer level: Needs assistance Equipment used: Rolling walker (2 wheeled)(bariatric walker) Transfers: Sit to/from Stand Sit to Stand: From elevated surface;Mod assist         General transfer comment: Patient completed STS with min/mod A +2 for equipment chair to [facility] and BSC to elevated bed using RW. Patient fatigues quickly and has difficulty maintaining weight bearing precutions. Has difficulty putting wieght through arms.    Ambulation/Gait             General Gait Details: Great difficulty moving left foot to complete STS transfer to chair; further gait not appropriate at this time. Will required two people to maintain WB precautions.    Stairs  Wheelchair Mobility    Modified Rankin (Stroke Patients Only)       Balance Overall balance assessment: Needs assistance Sitting-balance support: Bilateral upper extremity supported;Feet supported Sitting balance-Leahy Scale: Fair Sitting balance - Comments: steady sitting at EOB with BUE support     Standing balance-Leahy Scale: Poor Standing balance comment:  Dependent on RW to maintain standing balance                            Cognition Arousal/Alertness: Awake/alert Behavior During Therapy: WFL for tasks assessed/performed Overall Cognitive Status: No family/caregiver present to determine baseline cognitive functioning                                 General Comments:  appears to have cognitive deficits resembling developmental delay, likely at baseline.       Exercises Other Exercises Other Exercises: practiced/educated on proper sequencing for transfers, use of RW, and WB precautions.  Other Exercises: prolonged seated balance on BSC attempting to use urinal and have a BM. Constant attendance required for safety and to assist with toileting.     General Comments        Pertinent Vitals/Pain Pain Assessment: Faces Pain Score: 10-Worst pain ever Faces Pain Scale: No hurt Pain Location: right knee; patient shows minimal signs of distress; nursing confirms that he may struggle with processing ratings on scale Pain Intervention(s): Limited activity within patient's tolerance;Monitored during session;Patient requesting pain meds-RN notified;Ice applied    Home Living Family/patient expects to be discharged to:: Private residence Living Arrangements: Parent(lives with mother who requires physical assistance) Available Help at Discharge: Personal care attendant;Available PRN/intermittently Type of Home: House Home Access: Level entry   Home Layout: Multi-level;Full bath on main level;Able to live on main level with bedroom/bathroom;Other (Comment)(patient lives in basement) Home Equipment: None Additional Comments: Patient is A&O to person, place, and time, and partially oriented to situation. He demonstrates evidence of cognitive impairment consistent with developmental delay. He may not be a reliable historian. Patient reports he lives in 3 story home with his mother who requires his physical assistance for  care. He lives in the basement, where he has a bathroom with a walk-in shower and a kitchinette. He states his mother lives on the main level. He reports he could stay on the main level where there is a bathroom with tub/shower combo and a place to sleep. He reports no steps to enter the home, but 6 steps down to the basement and a third floor. He states he does not have a shower seat in either bathroom and no other DME. He states his mother requires his physical assistance, but then reports she has caregivers that come to the home.     Prior Function Level of Independence: Needs assistance  Gait / Transfers Assistance Needed: ambulated I without AD ADL's / Homemaking Assistance Needed: required help with dressing, bathing, and meal prep. Reports someone came to assist him with these.  Comments: Patient reports prior to hospitalization he was I with ambulation and stairs. He reports he required help with meal prep, dressing, and bathing.    PT Goals (current goals can now be found in the care plan section) Acute Rehab PT Goals Patient Stated Goal: go to [facility] for rehab, then return home PT Goal Formulation: With patient Time For Goal Achievement: 05/10/19 Potential to Achieve Goals: Good Progress towards PT  goals: Not progressing toward goals - comment(patient has not made further progress at this point)    Frequency    BID      PT Plan Current plan remains appropriate    Co-evaluation              AM-PAC PT "6 Clicks" Mobility   Outcome Measure  Help needed turning from your back to your side while in a flat bed without using bedrails?: A Little Help needed moving from lying on your back to sitting on the side of a flat bed without using bedrails?: A Little Help needed moving to and from a bed to a chair (including a wheelchair)?: A Lot Help needed standing up from a chair using your arms (e.g., wheelchair or bedside chair)?: A Lot Help needed to walk in hospital room?:  Total Help needed climbing 3-5 steps with a railing? : Total 6 Click Score: 12    End of Session Equipment Utilized During Treatment: Gait belt Activity Tolerance: Patient tolerated treatment well;Patient limited by fatigue Patient left: with nursing/sitter in room;in bed Nurse Communication: Mobility status;Weight bearing status(need for condom cath) PT Visit Diagnosis: Unsteadiness on feet (R26.81);History of falling (Z91.81);Muscle weakness (generalized) (M62.81);Difficulty in walking, not elsewhere classified (R26.2)     Time: 1308-6578 PT Time Calculation (min) (ACUTE ONLY): 1430 min  Charges:  $Therapeutic Activity: 8-22 mins                     Luretha Murphy. Ilsa Iha, PT, DPT 04/26/19, 1:19 PM

## 2019-04-26 NOTE — Anesthesia Postprocedure Evaluation (Signed)
Anesthesia Post Note  Patient: Ethan Alvarez  Procedure(s) Performed: REPAIR QUADRICEP TENDON (Right Knee)  Patient location during evaluation: PACU Anesthesia Type: General Level of consciousness: awake and alert and oriented Pain management: pain level controlled Vital Signs Assessment: post-procedure vital signs reviewed and stable Respiratory status: spontaneous breathing Cardiovascular status: blood pressure returned to baseline Anesthetic complications: no     Last Vitals:  Vitals:   04/26/19 0029 04/26/19 0439  BP: (!) 114/58 (!) 101/58  Pulse: 93 83  Resp: 19 19  Temp: 36.5 C 36.9 C  SpO2: 94% 96%    Last Pain:  Vitals:   04/26/19 0439  TempSrc: Oral  PainSc:                  Jalaiya Oyster

## 2019-04-26 NOTE — Progress Notes (Signed)
   Subjective: 1 Day Post-Op Procedure(s) (LRB): REPAIR QUADRICEP TENDON (Right) Patient reports pain as moderate.  Describes his pain as tightness throughout the right knee. Patient is well, and has had no acute complaints or problems Denies any CP, SOB, ABD pain. We will start physical therapy today  Objective: Vital signs in last 24 hours: Temp:  [97.6 F (36.4 C)-98.5 F (36.9 C)] 98.5 F (36.9 C) (05/10 0439) Pulse Rate:  [83-108] 83 (05/10 0439) Resp:  [14-28] 19 (05/10 0439) BP: (101-164)/(58-85) 101/58 (05/10 0439) SpO2:  [89 %-100 %] 96 % (05/10 0439) Weight:  [121.1 kg] 121.1 kg (05/09 0858)  Intake/Output from previous day: 05/09 0701 - 05/10 0700 In: 1800 [I.V.:700; IV Piggyback:1100] Out: 10 [Blood:10] Intake/Output this shift: No intake/output data recorded.  Recent Labs    04/25/19 1045  HGB 14.9   Recent Labs    04/25/19 1045  WBC 7.9  RBC 4.73  HCT 44.8  PLT 134*   Recent Labs    04/25/19 1045 04/26/19 0442  NA 136 137  K 4.6 4.4  CL 98 102  CO2 30 27  BUN 18 20  CREATININE 1.10 1.05  GLUCOSE 110* 115*  CALCIUM 9.0 7.8*   No results for input(s): LABPT, INR in the last 72 hours.  EXAM General - Patient is Alert, Appropriate and Oriented Extremity - Neurovascular intact Sensation intact distally Intact pulses distally Dorsiflexion/Plantar flexion intact No cellulitis present Compartment soft  Knee hinged brace with Ace wrap applied to right knee. Dressing - dressing C/D/I Motor Function - intact, moving foot and toes well on exam.   Past Medical History:  Diagnosis Date  . Hypertension     Assessment/Plan:   1 Day Post-Op Procedure(s) (LRB): REPAIR QUADRICEP TENDON (Right) Active Problems:   Rupture of right quadriceps tendon  Estimated body mass index is 38.31 kg/m as calculated from the following:   Height as of this encounter: 5\' 10"  (1.778 m).   Weight as of this encounter: 121.1 kg. Advance diet Up with therapy,  partial weightbearing right lower extremity with knee hinged brace locked in extension Patient states pain is moderate but sleeping.  We will continue to monitor pain today. Care management to assist with discharge.  DVT Prophylaxis - Lovenox    T. Cranston Neighbor, PA-C [facility] 04/26/2019, 7:41 AM

## 2019-04-26 NOTE — TOC Initial Note (Addendum)
Transition of Care [facility]) - Initial/Assessment Note    Patient Details  Name: Ethan Alvarez MRN: [id number] Date of Birth: 05/01/1961  Transition [facility]) CM/SW Contact:    Ethan Manifold, RN Phone Number: 04/26/2019, 9:12 AM  Clinical Narrative:  Patient is POD1 after quadricept repair. Patient has yet to work with PT but [facility] team assessed patient to assist with disposition as able. Patient currently lives at home, and has support from his brother and sister in law as needed and also has a caregiver in place who comes multiple times a week but it is primarily for his mother who has dementia. Patient lives with his mother in her basement. TOC team spoke Ethan Alvarez (sister in law) and caregivers are in place for the mother and they assist some with meal preparations etc but cannot provide assistance with activities of daily living etc for Mr Ethan Alvarez.Therefor they think patient should pursue SNF if recommended.  Patient has no DME but wishes to see what PT recommends he will need, at the least he will likely need a rolling walker. Will notify [employer] to follow if patient goes home. Patient has used rehab before at [facility] and would use them if needed.CMS Medicare.gov Compare Post Acute Care list reviewed with patient and he has no preference of agency. Referral placed with Ethan Alvarez at [facility] if home health is the recommendation. TOC will continue to follow for next levels of care planning.                 Expected Discharge Plan: Home w Home Health Services Barriers to Discharge: Continued Medical Work up   Patient Goals and CMS Choice Patient states their goals for this hospitalization and ongoing recovery are:: for my leg to keep getting better CMS Medicare.gov Compare Post Acute Care list provided to:: Patient Choice offered to / list presented to : Patient  Expected Discharge Plan and Services Expected Discharge Plan: Home w Home Health Services   Discharge Planning Services: CM Consult Post  Acute Care Choice: Home Health Living arrangements for the past 2 months: Single Family Home Expected Discharge Date: 04/27/19                         HH Arranged: PT HH Agency: [facility] Date HH Agency Contacted: 04/26/19 Time HH Agency Contacted: 0911 Representative spoke with at [facility] Agency: Ethan Alvarez  Prior Living Arrangements/Services Living arrangements for the past 2 months: Single Family Home   Patient language and need for interpreter reviewed:: No Do you feel safe going back to the place where you live?: Yes      Need for Family Participation in Patient Care: No (Comment)     Criminal Activity/Legal Involvement Pertinent to Current Situation/Hospitalization: No - Comment as needed  Activities of Daily Living Home Assistive Devices/Equipment: None ADL Screening (condition at time of admission) Patient's cognitive ability adequate to safely complete daily activities?: Yes Is the patient deaf or have difficulty hearing?: No Does the patient have difficulty seeing, even when wearing glasses/contacts?: No Does the patient have difficulty concentrating, remembering, or making decisions?: No Patient able to express need for assistance with ADLs?: Yes Does the patient have difficulty dressing or bathing?: No Independently performs ADLs?: Yes (appropriate for developmental age) Does the patient have difficulty walking or climbing stairs?: No Weakness of Legs: None Weakness of Arms/Hands: None  Permission Sought/Granted  Emotional Assessment Appearance:: Appears stated age Attitude/Demeanor/Rapport: Gracious, Ambitious Affect (typically observed): Accepting Orientation: : Oriented to Self, Oriented to Place, Oriented to  Time, Oriented to Situation      Admission diagnosis:  Traumatic rhabdomyolysis, initial encounter (HCC) [T79.6XXA] Rupture of right quadriceps tendon, initial encounter [W09.811B][S76.111A] Patient Active  Problem List   Diagnosis Date Noted  . Rupture of right quadriceps tendon 04/25/2019   PCP:  Ethan Alvarez, David, MD Pharm[facility]:   [facility] [place] [address] [place] [address] Phone: [phone] Fax: [phone]     Social Determinants of Health (SDOH) Interventions    Readmission Risk Interventions Readmission Risk Prevention Plan 04/26/2019  Medication Screening Complete  Transportation Screening Complete  Some recent data might be hidden

## 2019-04-26 NOTE — Plan of Care (Signed)

## 2019-04-26 NOTE — Progress Notes (Signed)
Patient admission assessment done. Patient denied pain, stating he was fine. Patient was educated on pain management, but was confused by pain scale, 1-10 and was then educated on faces also. Patient incontinent during the night, patient washed and bedding changed. Condom cath applied as patient stated he did not know he had to go. Patient needed frequent education on why he was unable to move his right leg the way he wanted.

## 2019-04-26 NOTE — Discharge Summary (Addendum)
Physician Discharge Summary  Patient ID: Ethan Alvarez MRN: [id number] DOB/AGE: [birth year] 57 y.o.  Admit date: 04/25/2019 Discharge date: 04/28/2019  Admission Diagnoses:  Traumatic rhabdomyolysis, initial encounter (HCC) [T79.6XXA] Rupture of right quadriceps tendon, initial encounter [G29.528U]  Discharge Diagnoses: Patient Active Problem List   Diagnosis Date Noted  . Rupture of right quadriceps tendon 04/25/2019    Past Medical History:  Diagnosis Date  . Hypertension      Transfusion: none   Consultants (if any):   Discharged Condition: Improved  Hospital Course: Ethan Alvarez is an 58 y.o. male who was admitted 04/25/2019 with a diagnosis of right quadriceps rupture and went to the operating room on 04/25/2019 and underwent the above named procedures.    Surgeries: Procedure(s): REPAIR QUADRICEP TENDON on 04/25/2019 Patient tolerated the surgery well. Taken to PACU where she was stabilized and then transferred to the orthopedic floor.  Started on Lovenox 40 mg  q 24 hrs. Foot pumps applied bilaterally at 80 mm. Heels elevated on bed with rolled towels. No evidence of DVT. Negative Homan. Physical therapy started on day #1 for gait training and transfer. OT started day #1 for ADL and assisted devices.  Patient's foley was d/c on day #1. Patient's IV and hemovac was d/c on day #2.  On post op day #2 patient was stable and ready for discharge to SNF.  Internal medicine consulted for tachycardia, patient remained asymptomatic.  Progress note on 04/27/19 stated patient is stable for discharge to rehab.  Implants: [employer] 5.5 mm TwinFix titanium anchors 3  He was given perioperative antibiotics:  Anti-infectives (From admission, onward)   Start     Dose/Rate Route Frequency Ordered Stop   04/25/19 1830  ceFAZolin (ANCEF) IVPB 2g/100 mL premix     2 g 200 mL/hr over 30 Minutes Intravenous Every 6 hours 04/25/19 1826 04/26/19 1229   04/25/19 1400  ceFAZolin (ANCEF) 3 g  in dextrose 5 % 50 mL IVPB     3 g 100 mL/hr over 30 Minutes Intravenous  Once 04/25/19 1235 04/25/19 1528    .  He was given sequential compression devices, early ambulation, and Lovenox for DVT prophylaxis.  He benefited maximally from the hospital stay and there were no complications.    Recent vital signs:  Vitals:   04/27/19 1933 04/28/19 0403  BP: 139/75 136/71  Pulse: 98 99  Resp: (!) 22 19  Temp: 97.8 F (36.6 C) 98.3 F (36.8 C)  SpO2: 96% 97%   Recent laboratory studies:  Lab Results  Component Value Date   HGB 14.9 04/25/2019   Lab Results  Component Value Date   WBC 7.9 04/25/2019   PLT 134 (L) 04/25/2019   No results found for: INR Lab Results  Component Value Date   NA 141 04/28/2019   K 4.0 04/28/2019   CL 101 04/28/2019   CO2 31 04/28/2019   BUN 15 04/28/2019   CREATININE 0.80 04/28/2019   GLUCOSE 91 04/28/2019   Discharge Medications:   Allergies as of 04/28/2019      Reactions   Haloperidol Lactate Other (See Comments)   Per [facility]; nature of intolerance unclear intolerance Other reaction(s): Other (See Comments) Per [facility]; nature of intolerance unclear      Medication List    TAKE these medications   acetaminophen 325 MG tablet Commonly known as:  TYLENOL Take 650 mg by mouth every 4 (four) hours as needed for mild pain.   amoxicillin 500 MG capsule  Commonly known as:  AMOXIL Take 500 mg by mouth 3 (three) times daily.   benztropine 1 MG tablet Commonly known as:  COGENTIN Take 1 mg by mouth 2 (two) times a day.   benztropine 0.5 MG tablet Commonly known as:  COGENTIN Take 0.5 mg by mouth daily.   clonazePAM 0.5 MG tablet Commonly known as:  KLONOPIN Take 0.5 mg by mouth 3 (three) times daily as needed for agitation.   divalproex 500 MG DR tablet Commonly known as:  DEPAKOTE Take 1,000 mg by mouth 2 (two) times a day.   enoxaparin 40 MG/0.4ML injection Commonly known as:  LOVENOX Inject 0.4 mLs (40 mg total) into the skin  daily.   Hinda GlatterInvega Sustenna 234 MG/1.5ML Susy injection Generic drug:  paliperidone Inject 234 mg into the muscle every 30 (thirty) days.   LORazepam 0.5 MG tablet Commonly known as:  ATIVAN Take 0.5 mg by mouth every morning.   LORazepam 1 MG tablet Commonly known as:  ATIVAN Take 1 mg by mouth 2 (two) times daily. May take an additional dose as needed for agitation   metroNIDAZOLE 0.75 % gel Commonly known as:  METROGEL Apply 1 application topically daily.   OLANZapine 15 MG tablet Commonly known as:  ZYPREXA Take 15 mg by mouth daily.   oxyCODONE 5 MG immediate release tablet Commonly known as:  Oxy IR/ROXICODONE Take 1-2 tablets (5-10 mg total) by mouth every 4 (four) hours as needed for moderate pain.   propranolol 20 MG tablet Commonly known as:  INDERAL Take 20 mg by mouth daily.   sertraline 100 MG tablet Commonly known as:  ZOLOFT Take 100 mg by mouth 2 (two) times a day.            Durable Medical Equipment  (From admission, onward)         Start     Ordered   04/25/19 1827  DME Walker rolling  Once    Question:  Patient needs a walker to treat with the following condition  Answer:  Rupture of right quadriceps tendon   04/25/19 1826   04/25/19 1827  DME 3 n 1  Once     04/25/19 1826   04/25/19 1827  DME Bedside commode  Once    Question:  Patient needs a bedside commode to treat with the following condition  Answer:  Rupture of right quadriceps tendon   04/25/19 1826          Diagnostic Studies: Dg Chest 1 View  Result Date: 04/27/2019 CLINICAL DATA:  Shortness of breath EXAM: CHEST  1 VIEW COMPARISON:  12/29/2009 FINDINGS: The heart size and mediastinal contours are within normal limits. Both lungs are clear. The visualized skeletal structures are unremarkable. IMPRESSION: No active disease. Electronically Signed   By: Deatra RobinsonKevin  Herman M.D.   On: 04/27/2019 02:05   Ct Angio Chest Pe W Or Wo Contrast  Result Date: 04/27/2019 CLINICAL DATA:   58 year old male with acute onset shortness of breath and weakness. EXAM: CT ANGIOGRAPHY CHEST WITH CONTRAST TECHNIQUE: Multidetector CT imaging of the chest was performed using the standard protocol during bolus administration of intravenous contrast. Multiplanar CT image reconstructions and MIPs were obtained to evaluate the vascular anatomy. CONTRAST:  75mL OMNIPAQUE IOHEXOL 350 MG/ML SOLN COMPARISON:  Portable chest earlier today.  Chest CT 12/27/2009. FINDINGS: Cardiovascular: Aortic dominant contrast bolus timing despite 2 different attempts. Superimposed respiratory motion artifact. Central pulmonary artery timing is better on the 2nd attempt. No central or hilar  pulmonary artery filling defect is identified. No calcified coronary artery atherosclerosis is evident. No cardiomegaly or pericardial effusion. Negative visible aorta. Mediastinum/Nodes: Negative.  No lymphadenopathy. Lungs/Pleura: Major airways appear patent aside from atelectatic changes. There is enhancing platelike dependent atelectasis in the right lung. Similar enhancing atelectasis in the left lower lobe, costophrenic angle. Additional mild nonspecific dependent opacity in the left upper lobe. 4 millimeters subpleural nodule in the anterior right upper lobe on series 6, image 36 near the minor fissure. No pleural effusion or other abnormal pulmonary opacity. Upper Abdomen: Hepatic steatosis. Probable splenomegaly. Otherwise negative visible upper abdomen. Musculoskeletal: Multilevel mild and chronic appearing superior endplate compression fractures in the thoracic spine. Some of these might be chronic Schmorl's nodes. No acute osseous abnormality identified. Review of the MIP images confirms the above findings. IMPRESSION: 1. Limited by aortic dominant contrast timing and respiratory motion artifact despite 2 attempts. No central pulmonary embolus. Negative visible aorta. 2. Mild bilateral atelectasis. Small 4 mm right lung nodule and minimal  other nonspecific dependent opacity in the left lung. No pleural effusion. No follow-up needed if patient is low-risk. Non-contrast chest CT can be considered in 12 months if patient is high-risk. This recommendation follows the consensus statement: Guidelines for Management of Incidental Pulmonary Nodules Detected on CT Images: From the Fleischner Society 2017; Radiology 2017; 284:228-243. 3. Hepatic steatosis and possible splenomegaly. Query chronic liver disease. Electronically Signed   By: Odessa Fleming M.D.   On: 04/27/2019 04:13   Ct Knee Right Wo Contrast  Result Date: 04/25/2019 CLINICAL DATA:  Right knee pain after fall last night. EXAM: CT OF THE right KNEE WITHOUT CONTRAST TECHNIQUE: Multidetector CT imaging of the right knee was performed according to the standard protocol. Multiplanar CT image reconstructions were also generated. COMPARISON:  04/25/2019 radiograph FINDINGS: Bones/Joint/Cartilage Abnormal anterior tilt of the upper margin of the patella noted with patellar irregularity along its upper margin compatible with rupture of the quadriceps tendon. The speckled densities suggestion of in the suprapatellar bursa on radiography are actually probably in the torn distal margin of the quadriceps tendon (although some could also be in the bursa). Lax patellar tendon. There is some slight irregularity along the proximal metaphysis of the proximal fibula, which could be from spurring or old deformity from a remote fracture. I do not observe this to be an acute fracture. There is a small effusion in the suprapatellar bursa along with considerable prepatellar subcutaneous edema especially in the vicinity of the distal quadriceps tendon. Small amount of nitrogen gas phenomenon in the lateral compartment Ligaments The outlines of the ACL, PCL, and collateral ligaments appear unremarkable. Muscles and Tendons Thickened and indistinct distal quadriceps tendon is clearly miss aligned with the top of the patella,  which is tilted forward, compatible with distal quadriceps rupture. Lax patellar tendon. Indistinct patellar retinacula, especially laterally. Soft tissues Prepatellar subcutaneous edema. IMPRESSION: 1. Ruptured distal quadriceps tendon with resulting abnormal anterior tilt of the upper margin of the patella. Small calcifications along the ruptured distal margin of the quadriceps probably represents small avulsion fragments or calcification within the tendon, although one or more of these calcifications could be in the small effusion in the suprapatellar bursa. Prepatellar subcutaneous edema noted. Lax patellar tendon with indistinct patellar retinacula with surrounding edema. Electronically Signed   By: Gaylyn Rong M.D.   On: 04/25/2019 10:40   Dg Knee Complete 4 Views Right  Result Date: 04/25/2019 CLINICAL DATA:  Right knee pain after fall last night. EXAM: RIGHT  KNEE - COMPLETE 4+ VIEW COMPARISON:  None. FINDINGS: No definite evidence of fracture or dislocation. No evidence of arthropathy. Probable small suprapatellar joint effusion is noted. Small bone densities are noted in suprapatellar region which potentially may represent loose bodies. Soft tissues are unremarkable. IMPRESSION: Probable small suprapatellar joint effusion. Small bone densities are noted in suprapatellar region which may represent loose bodies. No definite fracture or dislocation is noted. Electronically Signed   By: Lupita Raider M.D.   On: 04/25/2019 09:26   Disposition:  Plan for discharge to SNF when room available.  Follow-up Information    Anson Oregon, PA-C Follow up in 14 day(s).   Specialty:  Physician Assistant Why:  Mindi Slicker information: [address] [place] [address] [phone]          Signed: Meriel Pica PA-C 04/28/2019, 7:55 AM

## 2019-04-27 ENCOUNTER — Encounter: Payer: Self-pay | Admitting: Radiology

## 2019-04-27 ENCOUNTER — Inpatient Hospital Stay: Payer: Medicare Other

## 2019-04-27 ENCOUNTER — Other Ambulatory Visit: Payer: Self-pay

## 2019-04-27 DIAGNOSIS — R000 Tachycardia, unspecified: Secondary | ICD-10-CM

## 2019-04-27 LAB — BASIC METABOLIC PANEL
Anion gap: 9 (ref 5–15)
BUN: 17 mg/dL (ref 6–20)
CO2: 27 mmol/L (ref 22–32)
Calcium: 7.7 mg/dL — ABNORMAL LOW (ref 8.9–10.3)
Chloride: 102 mmol/L (ref 98–111)
Creatinine, Ser: 0.94 mg/dL (ref 0.61–1.24)
GFR calc Af Amer: >60 mL/min (ref >60)
GFR calc non Af Amer: >60 mL/min (ref >60)
Glucose, Bld: 101 mg/dL — ABNORMAL HIGH (ref 70–99)
Potassium: 4.1 mmol/L (ref 3.5–5.1)
Sodium: 138 mmol/L (ref 135–145)

## 2019-04-27 LAB — BRAIN NATRIURETIC PEPTIDE: B Natriuretic Peptide: 17 pg/mL (ref 0.0–100.0)

## 2019-04-27 LAB — TROPONIN I: Troponin I: <0.03 ng/mL (ref <0.03)

## 2019-04-27 LAB — FIBRIN DERIVATIVES D-DIMER (ARMC ONLY): Fibrin derivatives D-dimer (ARMC): 448.29 ng/mL (FEU) (ref 0.00–499.00)

## 2019-04-27 MED ORDER — FUROSEMIDE 10 MG/ML IJ SOLN
40.0000 mg | Freq: Once | INTRAMUSCULAR | Status: AC
  Administered 2019-04-27: 40 mg via INTRAVENOUS
  Filled 2019-04-27: qty 4

## 2019-04-27 MED ORDER — IPRATROPIUM-ALBUTEROL 0.5-2.5 (3) MG/3ML IN SOLN: 3.0000 mL | RESPIRATORY_TRACT | Status: DC | PRN

## 2019-04-27 MED ORDER — IOHEXOL 350 MG/ML SOLN
75.0000 mL | Freq: Once | INTRAVENOUS | Status: AC | PRN
  Administered 2019-04-27: 03:00:00 75 mL via INTRAVENOUS

## 2019-04-27 MED ORDER — OXYCODONE HCL 5 MG PO TABS: 5.0000 mg | ORAL_TABLET | ORAL | 0 refills | Status: DC | PRN

## 2019-04-27 MED ORDER — ENOXAPARIN SODIUM 40 MG/0.4ML ~~LOC~~ SOLN: 40.0000 mg | SUBCUTANEOUS | 0 refills | Status: DC

## 2019-04-27 NOTE — Progress Notes (Signed)
Physical Therapy Treatment Patient Details Name: Ethan Alvarez MRN: [id number] DOB: 21-Apr-1961 Today's Date: 04/27/2019    History of Present Illness Hrishikesh Hoeg Rheu is a 58 y.o. male arrived at the ED by EMS on 04/25/2019 comlaining of R knee pain and inability to ambulate after an unwitnessed fall going down the stairs 04/24/2019. He was found on the floor by caregiver the morning following the fall. PMH includes schizophrenia and hypertension. CT R knee: Patellar tendon rupture. Patient was admitted to the hospital for R quad tenden rupture and traumatic rhabdomyolysis and he underwent a priomary repair of R quadriceps tendion rupture was performed 04/25/2019.  Partial weight bearing up to 50% on R LE with brace locked in extension.     PT Comments    Patient is a pleasant 58 year old male who was in process of being cleaned/bedding changed. PT and OT co-treat assisted with rolling with Mod A and cueing for sequencing of movement. Patient requires task orientation, extra time for processing, and simple commands for successful performance of interventions. Supine to sit requires x2 assist with sequencing/assist for LE's and trunk. Upon sitting EOB patient demonstrated excessive posterior lean with UE and LE movements requiring CGA-Mod A for stability. Vitals monitored throughout session remaining in therapeutic range. Patient fatigued with prolonged UE and LE movements in seated position requiring return to supine with visible shaking of LE's and trunk. Patient requires assistance x 2 to supine position, bed alarms set, and breakfast placed before patient. Nursing relayed patient's performance and pain levels per faces chart/report. Current POC remains appropriate.    Follow Up Recommendations  SNF     Equipment Recommendations  Rolling walker with 5" wheels;3in1 (PT);Wheelchair (measurements PT);Wheelchair cushion (measurements PT)    Recommendations for Other Services OT consult     Precautions  / Restrictions Precautions Precautions: Fall Restrictions Weight Bearing Restrictions: Yes RLE Weight Bearing: Partial weight bearing RLE Partial Weight Bearing Percentage or Pounds: 50% with brace Other Position/Activity Restrictions: patient verbalizes understanding after re-education     Mobility  Bed Mobility Overal bed mobility: Needs Assistance Bed Mobility: Sit to Supine;Supine to Sit     Supine to sit: Min assist;Mod assist Sit to supine: Mod assist   General bed mobility comments: required help with legs and trunk; scoot up bed mod A +2 with draw sheet; sequencing with short simple commands and task orientation   Transfers   Equipment used: (bariatric walker)             General transfer comment: unable to perform due to fatigue, LE shaking with exhaustion   Ambulation/Gait                 Stairs             Wheelchair Mobility    Modified Rankin (Stroke Patients Only)       Balance Overall balance assessment: Needs assistance Sitting-balance support: Bilateral upper extremity supported;Feet supported Sitting balance-Leahy Scale: Poor Sitting balance - Comments: sitting EOB requires CGA-Mod A for reaching inside BOS and lifting LE's.                                     Cognition Arousal/Alertness: Awake/alert Behavior During Therapy: WFL for tasks assessed/performed Overall Cognitive Status: No family/caregiver present to determine baseline cognitive functioning  General Comments: history of cognitive deficits per pre documentation       Exercises Other Exercises Other Exercises: practiced/educated on proper sequencing for transfers, use of RW, and WB precautions.  Other Exercises: prolonged seated balance EOB  Constant attendance required for safety with task orientation, cueing via tactile and verbal for proper alignment. visual and verbal cueing for amplitude of movement  and direction of limb progression. Patient challenged more with left lean to reach for OT hand than with right lean, excessive trunk extension noted with seated marches and LAQ.  Other Exercises: rolling L and R with PT, OT, and CNAs for changing bedding/cleaning of patient upon initial enterance into room, required cueing and mod-max assistance.     General Comments General comments (skin integrity, edema, etc.): scratches/markings of UE from fall.       Pertinent Vitals/Pain Pain Assessment: Faces Faces Pain Scale: Hurts worst Pain Location: right knee, patient states the red face, repeated measure with same results  Pain Descriptors / Indicators: Aching Pain Intervention(s): Limited activity within patient's tolerance;Monitored during session;Repositioned;Other (comment)(notified nursing)    Home Living                      Prior Function            PT Goals (current goals can now be found in the care plan section) Acute Rehab PT Goals Patient Stated Goal: go to [facility] SN[facility] for rehab, then return home PT Goal Formulation: With patient Time For Goal Achievement: 05/10/19 Potential to Achieve Goals: Good Progress towards PT goals: Progressing toward goals(very slowly )    Frequency    BID      PT Plan Current plan remains appropriate    Co-evaluation PT/OT/SLP Co-Evaluation/Treatment: Yes Reason for Co-Treatment: Complexity of the patient's impairments (multi-system involvement);Necessary to address cognition/behavior during functional activity;To address functional/ADL transfers PT goals addressed during session: Mobility/safety with mobility;Balance;Strengthening/ROM OT goals addressed during session: ADL's and self-care;Strengthening/ROM      AM-PAC PT "6 Clicks" Mobility   Outcome Measure  Help needed turning from your back to your side while in a flat bed without using bedrails?: A Little Help needed moving from lying on your back to sitting on the  side of a flat bed without using bedrails?: A Little Help needed moving to and from a bed to a chair (including a wheelchair)?: A Lot Help needed standing up from a chair using your arms (e.g., wheelchair or bedside chair)?: A Lot Help needed to walk in hospital room?: Total Help needed climbing 3-5 steps with a railing? : Total 6 Click Score: 12    End of Session Equipment Utilized During Treatment: Gait belt Activity Tolerance: Patient tolerated treatment well;Patient limited by fatigue Patient left: in bed;with call bell/phone within reach;with bed alarm set Nurse Communication: Mobility status;Weight bearing status;Other (comment)(patient's pain status and performance) PT Visit Diagnosis: Unsteadiness on feet (R26.81);History of falling (Z91.81);Muscle weakness (generalized) (M62.81);Difficulty in walking, not elsewhere classified (R26.2)     Time: 7989-2119 PT Time Calculation (min) (ACUTE ONLY): 33 min  Charges:  $Therapeutic Activity: 8-22 mins                     Precious Bard, PT, DPT     Precious Bard 04/27/2019, 11:26 AM

## 2019-04-27 NOTE — Progress Notes (Signed)
Pt sweating with increase respiratory rate. Patient face appeared very red, he denies breathing difficulty or chest pain.  Vital signs obtained showing increased HR of 112. Blood pressure elevated. Lungs diminished in the bases. Dr. Joice Lofts notified. MD would like a consult with hospitalitis.

## 2019-04-27 NOTE — [place] FL2 (Signed)
[place] MEDICAID FL2 LEVEL OF CARE SCREENING TOOL     IDENTIFICATION  Patient Name: Ethan Alvarez Birthdate: October 22, 1961 Sex: male Admission Date (Current Location): 04/25/2019  [place] and [place] Number:  Chiropodist and Address:  [facility], [address], [place], [place] [address]      Provider Number: [id number]  Attending Physician Name and Address:  Adrian Saran, MD  Relative Name and Phone Number:       Current Level of Care: Hospital Recommended Level of Care: Skilled Nursing Facility Prior Approval Number:    Date Approved/Denied:   PASRR Number: pending   Discharge Plan: SNF    Current Diagnoses: Patient Active Problem List   Diagnosis Date Noted  . Rupture of right quadriceps tendon 04/25/2019    Orientation RESPIRATION BLADDER Height & Weight     Self, Place, Time  O2(2 L) Continent Weight: 287 lb 7.7 oz (130.4 kg) Height:  5\' 10"  (177.8 cm)  BEHAVIORAL SYMPTOMS/MOOD NEUROLOGICAL BOWEL NUTRITION STATUS  (none) (none) Continent Diet(Regular)  AMBULATORY STATUS COMMUNICATION OF NEEDS Skin   Extensive Assist Verbally Surgical wounds(Right knee )                       Personal Care Assistance Level of Assistance  Bathing, Feeding, Dressing Bathing Assistance: Limited assistance Feeding assistance: Independent Dressing Assistance: Limited assistance     Functional Limitations Info  Sight, Hearing, Speech Sight Info: Adequate Hearing Info: Adequate Speech Info: Adequate    SPECIAL CARE FACTORS FREQUENCY  PT (By licensed PT), OT (By licensed OT)     PT Frequency: 5 OT Frequency: 5            Contractures Contractures Info: Not present    Additional Factors Info  Code Status, Allergies Code Status Info: Full Code  Allergies Info: Haloperidol Lactate            Current Medications (04/27/2019):  This is the current hospital active medication list Current Facility-Administered Medications   Medication Dose Route Frequency Provider Last Rate Last Dose  . 0.9 %  sodium chloride infusion   Intravenous Continuous Poggi, Excell Seltzer, MD   Stopped at 04/27/19 0115  . acetaminophen (TYLENOL) tablet 325-650 mg  325-650 mg Oral Q6H PRN Poggi, Excell Seltzer, MD      . benztropine (COGENTIN) tablet 0.5 mg  0.5 mg Oral Daily Poggi, Excell Seltzer, MD   0.5 mg at 04/26/19 1008  . benztropine (COGENTIN) tablet 1 mg  1 mg Oral BID Poggi, Excell Seltzer, MD   1 mg at 04/26/19 2107  . bisacodyl (DULCOLAX) suppository 10 mg  10 mg Rectal Daily PRN Poggi, Excell Seltzer, MD      . clonazePAM Scarlette Calico) tablet 0.5 mg  0.5 mg Oral TID PRN Poggi, Excell Seltzer, MD      . diphenhydrAMINE (BENADRYL) 12.5 MG/5ML elixir 12.5-25 mg  12.5-25 mg Oral Q4H PRN Poggi, Excell Seltzer, MD      . divalproex (DEPAKOTE) DR tablet 1,000 mg  1,000 mg Oral BID Christena Flake, MD   1,000 mg at 04/27/19 0055  . docusate sodium (COLACE) capsule 100 mg  100 mg Oral BID Christena Flake, MD   100 mg at 04/26/19 2107  . enoxaparin (LOVENOX) injection 40 mg  40 mg Subcutaneous Q24H Poggi, Excell Seltzer, MD   40 mg at 04/26/19 1005  . HYDROmorphone (DILAUDID) injection 0.25-0.5 mg  0.25-0.5 mg Intravenous Q2H PRN Poggi, Excell Seltzer, MD      .  ipratropium-albuterol (DUONEB) 0.5-2.5 (3) MG/3ML nebulizer solution 3 mL  3 mL Nebulization Q4H PRN Mansy, Jan A, MD      . LORazepam (ATIVAN) tablet 0.5 mg  0.5 mg Oral q morning - 10a Poggi, Excell SeltzerJohn J, MD   0.5 mg at 04/26/19 1012  . LORazepam (ATIVAN) tablet 1 mg  1 mg Oral BID Poggi, Excell SeltzerJohn J, MD   1 mg at 04/26/19 2107  . magnesium hydroxide (MILK OF MAGNESIA) suspension 30 mL  30 mL Oral Daily PRN Poggi, Excell SeltzerJohn J, MD      . metoCLOPramide (REGLAN) tablet 5-10 mg  5-10 mg Oral Q8H PRN Poggi, Excell SeltzerJohn J, MD       Or  . metoCLOPramide (REGLAN) injection 5-10 mg  5-10 mg Intravenous Q8H PRN Poggi, Excell SeltzerJohn J, MD      . metroNIDAZOLE (METROGEL) 0.75 % gel 1 application  1 application Topical Daily Poggi, Excell SeltzerJohn J, MD   1 application at 04/26/19 1009  . OLANZapine  (ZYPREXA) tablet 15 mg  15 mg Oral Daily Poggi, Excell SeltzerJohn J, MD   15 mg at 04/26/19 1008  . ondansetron (ZOFRAN) tablet 4 mg  4 mg Oral Q6H PRN Poggi, Excell SeltzerJohn J, MD       Or  . ondansetron (ZOFRAN) injection 4 mg  4 mg Intravenous Q6H PRN Poggi, Excell SeltzerJohn J, MD      . oxyCODONE (Oxy IR/ROXICODONE) immediate release tablet 5-10 mg  5-10 mg Oral Q4H PRN Poggi, Excell SeltzerJohn J, MD   10 mg at 04/27/19 0128  . propranolol (INDERAL) tablet 20 mg  20 mg Oral Daily Poggi, Excell SeltzerJohn J, MD      . sertraline (ZOLOFT) tablet 100 mg  100 mg Oral BID Christena FlakePoggi, John J, MD   100 mg at 04/26/19 2107  . sodium phosphate (FLEET) 7-19 GM/118ML enema 1 enema  1 enema Rectal Once PRN Poggi, Excell SeltzerJohn J, MD      . traMADol Janean Sark(ULTRAM) tablet 50 mg  50 mg Oral Q6H PRN Poggi, Excell SeltzerJohn J, MD         Discharge Medications: Please see discharge summary for a list of discharge medications.  Relevant Imaging Results:  Relevant Lab Results:   Additional Information SSN: [id number]  Ruthe MannanCandace  Breaker [place], [place]

## 2019-04-27 NOTE — Consult Note (Signed)
[facility] [place] [facility]   PATIENT NAME: Ethan Alvarez    MR#:  [id number]  DATE OF BIRTH:  10-Oct-1961  DATE OF ADMISSION:  04/25/2019  PRIMARY CARE PHYSICIAN: Mickey Farber, MD   REQUESTING/REFERRING PHYSICIAN: Dr Joice Lofts  CHIEF COMPLAINT:   Chief Complaint  Patient presents with   Fall    HISTORY OF PRESENT ILLNESS:  Ethan Alvarez  is a 58 y.o. male status post repair of quadricep injury with Dr. Joice Lofts.  Hospitalist group was consulted for medical management for patient experiencing tachypnea with respiration in the 30s as well as tachycardia with heart rate 112-120.  No oxygen desaturation noted with 92 to 96% oxygen saturation on 3 L per nasal cannula.  At the time I am seeing the patient, he denies shortness of breath or chest pain.  Respirations have decreased to 20-24 with heart rate 112 currently.  Patient is lying in bed in no distress.  He remains afebrile.  He denies nausea or vomiting.  He denies abdominal pain.  No edema of extremities noted.  EKG was completed demonstrating sinus tachycardia at 120.  Chest x-ray shows no acute pulmonary disease.  Patient is felt to possibly have become mildly hypervolemic and therefore has received a one-time dose of 40 mg IV Lasix.  He will have DuoNebs every 6 hours as needed for shortness of breath as well.  Thank you for this consultation and we will continue to follow along with you during this patient's hospitalization.  PAST MEDICAL HISTORY:   Past Medical History:  Diagnosis Date   Hypertension     PAST SURGICAL HISTOIRY:  History reviewed. No pertinent surgical history.  SOCIAL HISTORY:   Social History   Tobacco Use   Smoking status: Never Smoker   Smokeless tobacco: Never Used  Substance Use Topics   Alcohol use: Not on file    FAMILY HISTORY:  History reviewed. No pertinent family history.  DRUG ALLERGIES:   Allergies  Allergen Reactions   Haloperidol Lactate Other (See  Comments)    Per [facility]; nature of intolerance unclear intolerance Other reaction(s): Other (See Comments) Per [facility]; nature of intolerance unclear     REVIEW OF SYSTEMS:  CONSTITUTIONAL: No fever, fatigue or weakness.  EYES: No blurred or double vision.  EARS, NOSE, AND THROAT: No tinnitus or ear pain.  RESPIRATORY: No cough, shortness of breath, wheezing or hemoptysis.  CARDIOVASCULAR: No chest pain, orthopnea, edema.  GASTROINTESTINAL: No nausea, vomiting, diarrhea or abdominal pain.  GENITOURINARY: No dysuria, hematuria.  ENDOCRINE: No polyuria, nocturia,  HEMATOLOGY: No anemia, easy bruising or bleeding SKIN: No rash or lesion. MUSCULOSKELETAL: Right lower extremity pain, no edema or arthritis.   NEUROLOGIC: No tingling, numbness, weakness.  PSYCHIATRY: Positive for anxiety  MEDICATIONS AT HOME:   Prior to Admission medications   Medication Sig Start Date End Date Taking? Authorizing Provider  acetaminophen (TYLENOL) 325 MG tablet Take 650 mg by mouth every 4 (four) hours as needed for mild pain. 03/21/17  Yes [provider]  amoxicillin (AMOXIL) 500 MG capsule Take 500 mg by mouth 3 (three) times daily. 04/21/19  Yes [provider]  benztropine (COGENTIN) 0.5 MG tablet Take 0.5 mg by mouth daily. 03/25/19  Yes [provider]  benztropine (COGENTIN) 1 MG tablet Take 1 mg by mouth 2 (two) times a day. 09/13/14  Yes [provider]  clonazePAM (KLONOPIN) 0.5 MG tablet Take 0.5 mg by mouth 3 (three) times daily as needed for  agitation. 04/08/19  Yes [provider]  divalproex (DEPAKOTE) 500 MG DR tablet Take 1,000 mg by mouth 2 (two) times a day. 02/13/17 07/23/19 Yes [provider]  INVEGA SUSTENNA 234 MG/1.5ML SUSY injection Inject 234 mg into the muscle every 30 (thirty) days. 04/10/19  Yes [provider]  LORazepam (ATIVAN) 0.5 MG tablet Take 0.5 mg by mouth every morning.    Yes [provider]  LORazepam (ATIVAN) 1  MG tablet Take 1 mg by mouth 2 (two) times daily. May take an additional dose as needed for agitation 01/23/19  Yes [provider]  metroNIDAZOLE (METROGEL) 0.75 % gel Apply 1 application topically daily. 04/04/19  Yes [provider]  OLANZapine (ZYPREXA) 15 MG tablet Take 15 mg by mouth daily. 03/25/19 07/23/19 Yes [provider]  propranolol (INDERAL) 20 MG tablet Take 20 mg by mouth daily.  08/12/18  Yes [provider]  sertraline (ZOLOFT) 100 MG tablet Take 100 mg by mouth 2 (two) times a day. 01/30/18 07/23/19 Yes [provider]      VITAL SIGNS:  Blood pressure (!) 159/81, pulse (!) 110, temperature 98.9 F (37.2 C), temperature source Oral, resp. rate 19, height 5\' 10"  (1.778 m), weight 130.4 kg, SpO2 95 %.  PHYSICAL EXAMINATION:  GENERAL:  58 y.o.-year-old patient lying in the bed with no acute distress.  EYES: Pupils equal, round, reactive to light and accommodation. No scleral icterus. Extraocular muscles intact.  HEENT: Head atraumatic, normocephalic. Oropharynx and nasopharynx clear.  NECK:  Supple, no jugular venous distention. No thyroid enlargement, no tenderness.  LUNGS: Normal breath sounds bilaterally, no wheezing, rales,rhonchi or crepitation. No use of accessory muscles of respiration.-Mild tachypnea CARDIOVASCULAR: Tachycardia, S1, S2 normal. No murmurs, rubs, or gallops.  ABDOMEN: Soft, nontender, nondistended. Bowel sounds present. No organomegaly or mass.  EXTREMITIES: No pedal edema, cyanosis, or clubbing.  Dressing to right lower extremity status post surgical repair NEUROLOGIC: Cranial nerves II through XII are intact. Muscle strength 5/5 in all extremities. Sensation intact. Gait not checked.  PSYCHIATRIC: The patient is alert and oriented x 3.  SKIN: No obvious rash, lesion, or ulcer.  LABORATORY PANEL:   CBC Recent Labs  Lab 04/25/19 1045  WBC 7.9  HGB 14.9  HCT 44.8  PLT 134*    ------------------------------------------------------------------------------------------------------------------  Chemistries  Recent Labs  Lab 04/27/19 0228  NA 138  K 4.1  CL 102  CO2 27  GLUCOSE 101*  BUN 17  CREATININE 0.94  CALCIUM 7.7*   ------------------------------------------------------------------------------------------------------------------  Cardiac Enzymes Recent Labs  Lab 04/27/19 0228  TROPONINI <0.03   ------------------------------------------------------------------------------------------------------------------  RADIOLOGY:  Dg Chest 1 View  Result Date: 04/27/2019 CLINICAL DATA:  Shortness of breath EXAM: CHEST  1 VIEW COMPARISON:  12/29/2009 FINDINGS: The heart size and mediastinal contours are within normal limits. Both lungs are clear. The visualized skeletal structures are unremarkable. IMPRESSION: No active disease. Electronically Signed   By: Deatra Robinson M.D.   On: 04/27/2019 02:05   Ct Angio Chest Pe W Or Wo Contrast  Result Date: 04/27/2019 CLINICAL DATA:  58 year old male with acute onset shortness of breath and weakness. EXAM: CT ANGIOGRAPHY CHEST WITH CONTRAST TECHNIQUE: Multidetector CT imaging of the chest was performed using the standard protocol during bolus administration of intravenous contrast. Multiplanar CT image reconstructions and MIPs were obtained to evaluate the vascular anatomy. CONTRAST:  64mL OMNIPAQUE IOHEXOL 350 MG/ML SOLN COMPARISON:  Portable chest earlier today.  Chest CT 12/27/2009. FINDINGS: Cardiovascular: Aortic dominant contrast bolus  timing despite 2 different attempts. Superimposed respiratory motion artifact. Central pulmonary artery timing is better on the 2nd attempt. No central or hilar pulmonary artery filling defect is identified. No calcified coronary artery atherosclerosis is evident. No cardiomegaly or pericardial effusion. Negative visible aorta. Mediastinum/Nodes: Negative.  No lymphadenopathy.  Lungs/Pleura: Major airways appear patent aside from atelectatic changes. There is enhancing platelike dependent atelectasis in the right lung. Similar enhancing atelectasis in the left lower lobe, costophrenic angle. Additional mild nonspecific dependent opacity in the left upper lobe. 4 millimeters subpleural nodule in the anterior right upper lobe on series 6, image 36 near the minor fissure. No pleural effusion or other abnormal pulmonary opacity. Upper Abdomen: Hepatic steatosis. Probable splenomegaly. Otherwise negative visible upper abdomen. Musculoskeletal: Multilevel mild and chronic appearing superior endplate compression fractures in the thoracic spine. Some of these might be chronic Schmorl's nodes. No acute osseous abnormality identified. Review of the MIP images confirms the above findings. IMPRESSION: 1. Limited by aortic dominant contrast timing and respiratory motion artifact despite 2 attempts. No central pulmonary embolus. Negative visible aorta. 2. Mild bilateral atelectasis. Small 4 mm right lung nodule and minimal other nonspecific dependent opacity in the left lung. No pleural effusion. No follow-up needed if patient is low-risk. Non-contrast chest CT can be considered in 12 months if patient is high-risk. This recommendation follows the consensus statement: Guidelines for Management of Incidental Pulmonary Nodules Detected on CT Images: From the Fleischner Society 2017; Radiology 2017; 284:228-243. 3. Hepatic steatosis and possible splenomegaly. Query chronic liver disease. Electronically Signed   By: Odessa FlemingH  Hall M.D.   On: 04/27/2019 04:13   Ct Knee Right Wo Contrast  Result Date: 04/25/2019 CLINICAL DATA:  Right knee pain after fall last night. EXAM: CT OF THE right KNEE WITHOUT CONTRAST TECHNIQUE: Multidetector CT imaging of the right knee was performed according to the standard protocol. Multiplanar CT image reconstructions were also generated. COMPARISON:  04/25/2019 radiograph FINDINGS:  Bones/Joint/Cartilage Abnormal anterior tilt of the upper margin of the patella noted with patellar irregularity along its upper margin compatible with rupture of the quadriceps tendon. The speckled densities suggestion of in the suprapatellar bursa on radiography are actually probably in the torn distal margin of the quadriceps tendon (although some could also be in the bursa). Lax patellar tendon. There is some slight irregularity along the proximal metaphysis of the proximal fibula, which could be from spurring or old deformity from a remote fracture. I do not observe this to be an acute fracture. There is a small effusion in the suprapatellar bursa along with considerable prepatellar subcutaneous edema especially in the vicinity of the distal quadriceps tendon. Small amount of nitrogen gas phenomenon in the lateral compartment Ligaments The outlines of the ACL, PCL, and collateral ligaments appear unremarkable. Muscles and Tendons Thickened and indistinct distal quadriceps tendon is clearly miss aligned with the top of the patella, which is tilted forward, compatible with distal quadriceps rupture. Lax patellar tendon. Indistinct patellar retinacula, especially laterally. Soft tissues Prepatellar subcutaneous edema. IMPRESSION: 1. Ruptured distal quadriceps tendon with resulting abnormal anterior tilt of the upper margin of the patella. Small calcifications along the ruptured distal margin of the quadriceps probably represents small avulsion fragments or calcification within the tendon, although one or more of these calcifications could be in the small effusion in the suprapatellar bursa. Prepatellar subcutaneous edema noted. Lax patellar tendon with indistinct patellar retinacula with surrounding edema. Electronically Signed   By: Gaylyn RongWalter  Liebkemann M.D.   On: 04/25/2019 10:40  Dg Knee Complete 4 Views Right  Result Date: 04/25/2019 CLINICAL DATA:  Right knee pain after fall last night. EXAM: RIGHT KNEE -  COMPLETE 4+ VIEW COMPARISON:  None. FINDINGS: No definite evidence of fracture or dislocation. No evidence of arthropathy. Probable small suprapatellar joint effusion is noted. Small bone densities are noted in suprapatellar region which potentially may represent loose bodies. Soft tissues are unremarkable. IMPRESSION: Probable small suprapatellar joint effusion. Small bone densities are noted in suprapatellar region which may represent loose bodies. No definite fracture or dislocation is noted. Electronically Signed   By: Lupita Raider M.D.   On: 04/25/2019 09:26    EKG:   Orders placed or performed during the hospital encounter of 04/25/19   EKG 12-Lead   EKG 12-Lead   EKG 12-Lead   EKG 12-Lead   EKG 12-Lead   EKG 12-Lead    IMPRESSION AND PLAN:   1. acute respiratory distress - Resolving after patient received IV Lasix with ongoing diuresis - DuoNebs every 6 hours as needed - O2 at 3 L/min per nasal cannula with oxygen saturations 92 to 96% - CTA chest is negative for pulmonary embolism  2.  Anxiety - Patient is treated with propranolol, Cogentin, Depakote, and Ativan.  These have been restarted  3.  Status post quadricep injury repair - Pain control with analgesic - CTA negative for pulmonary embolism - DVT prophylaxis initiated with Lovenox on 8 AM of 04/26/2019  We thank you for this consultation we will continue to follow along with you during this patient's hospitalization    All the records are reviewed and case discussed with Consulting provider. Management plans discussed with the patient, family and they are in agreement.  CODE STATUS: Full Code  TOTAL TIME TAKING CARE OF THIS PATIENT: .    Milas Kocher Joesphine Schemm CRNP on 04/27/2019 at 6:40 AM  Between 7am to 6pm - Pager - [phone]  After 6pm go to [email or web address] - Social research officer, government  [facility] [place] [facility]  Office  [phone]  CC: Primary care Physician: Mickey Farber,  MD   Note: This dictation was prepared with Dragon dictation along with smaller phrase technology. Any transcriptional errors that result from this process are unintentional.

## 2019-04-27 NOTE — TOC Progression Note (Signed)
Transition of Care [facility]) - Progression Note    Patient Details  Name: Ethan Alvarez MRN: [id number] Date of Birth: 07-29-1961  [facility] CM/SW Contact  Ruthe Mannan, [place] Phone Number: 04/27/2019, 4:51 PM  Clinical Narrative: CSW spoke with patient's sister [name] [phone]. Sister states that patient should go to SNF at discharge and is in agreement with bed search. CSW began bed search. Sister prefers [facility] if possible. CSW has reached out to [place] who states that they are currently reviewing the referral. Patient also hs a level 2 PASRR pending at this time. CSW will continue to follow for discharge planning.       Expected Discharge Plan: Home w Home Health Services Barriers to Discharge: Continued Medical Work up  Expected Discharge Plan and Services Expected Discharge Plan: Home w Home Health Services   Discharge Planning Services: CM Consult Post Acute Care Choice: Home Health Living arrangements for the past 2 months: Single Family Home Expected Discharge Date: 04/28/19                         HH Arranged: PT HH Agency: [facility] Date HH Agency Contacted: 04/26/19 Time HH Agency Contacted: 0911 Representative spoke with at [facility] Agency: teresa   Social Determinants of Health (SDOH) Interventions    Readmission Risk Interventions Readmission Risk Prevention Plan 04/26/2019  Medication Screening Complete  Transportation Screening Complete  Some recent data might be hidden

## 2019-04-27 NOTE — Discharge Instructions (Signed)
Diet: As you were doing prior to hospitalization   Shower:  Keep incision dry, keep honeycomb dressing applied.  Dressing:  You may change your dressing as needed. Change the dressing with sterile gauze dressing.    Activity:  Increase activity slowly as tolerated, but follow the weight bearing instructions below.  No lifting or driving for 6 weeks.  Weight Bearing:   Partial weightbearing to the right lower extremity.  To prevent constipation: you may use a stool softener such as -  Colace (over the counter) 100 mg by mouth twice a day  Drink plenty of fluids (prune juice may be helpful) and high fiber foods Miralax (over the counter) for constipation as needed.    Itching:  If you experience itching with your medications, try taking only a single pain pill, or even half a pain pill at a time.  You may take up to 10 pain pills per day, and you can also use benadryl over the counter for itching or also to help with sleep.   Precautions:  If you experience chest pain or shortness of breath - call 911 immediately for transfer to the hospital emergency department!!  If you develop a fever greater that 101 F, purulent drainage from wound, increased redness or drainage from wound, or calf pain-Call [facility]                                               Follow- Up Appointment:  Please call for an appointment to be seen in 2 weeks at [facility]

## 2019-04-27 NOTE — Progress Notes (Signed)
Patient seen and examined. CT negative for pulmonary emboli.  Suspect patient has component of atelectasis.  ISS ordered.  Patient is asymptomatic.  Would continue current management. Okay to discharge from medical standpoint of view to skilled nursing facility.

## 2019-04-27 NOTE — H&P (Addendum)
Attending physician consult note:  I have seen and examined the patient with Ms. Ethan Alvarez, CRNP.  The patient was seen in consultation due to tachypnea and tachycardia with associated dyspnea on postoperative day 1 after having a right quadriceps tendon injury repair.  Upon physical examination:  Generally: Pleasant middle-aged Caucasian male in mild respiratory distress with conversational dyspnea Cardiovascular: Regular rate and rhythm with normal S1-S2 and no murmurs gallops or rubs. Respiratory: Clear to auscultation bilaterally with minimal diminished bibasilar breath sounds Abdomen: Soft, nontender, nondistended with positive bowel sounds and no palpable megaly or masses. Extremities: Trace bilateral lower extremity pitting edema with no clubbing or cyanosis  Labs and radiographic studies: were all reviewed.  For which x-ray revealed atelectasis  EKG showed sinus tachycardia with prolonged QT interval with a rate of 120.  Chest CTA revealed atelectasis with no evidence for PE.  Assessment/plan: Acute dyspnea with associated tachycardia and tachypnea, probably related to fluid overload.  The patient was given 40 mg of IV Lasix with significant improvement of his heart rate and respiratory rate as well as his dyspnea.  His IV fluids were held off.  1 encourage incentive spirometry and continue monitoring him.  For further details please refer to dictated admission H&P.  I have discussed the case with my nurse practitioner. I agree with the admission note and the rest of the plan of care as delineated by Ms. Ethan Alvarez, CRNP.

## 2019-04-27 NOTE — Progress Notes (Signed)
PT Cancellation Note  Patient Details Name: Ethan Alvarez MRN: [id number] DOB: 08-31-1961   Cancelled Treatment:    Reason Eval/Treat Not Completed: Other (comment)(CNA's in room cleaning patient. ) Will attempt again at later time/date.   Precious Bard, PT, DPT   04/27/2019, 2:44 PM

## 2019-04-27 NOTE — Progress Notes (Signed)
Spoke with Dr Arville Care, MD is aware of consult. New orders obtained.

## 2019-04-27 NOTE — Evaluation (Addendum)
Occupational Therapy Evaluation Patient Details Name: Ethan Alvarez MRN: [id number] DOB: 12/09/1961 Today's Date: 04/27/2019    History of Present Illness Ethan Alvarez is a 58 y.o. male arrived at the ED by EMS on 04/25/2019 comlaining of R knee pain and inability to ambulate after an unwitnessed fall going down the stairs 04/24/2019. He was found on the floor by caregiver the morning following the fall. PMH includes schizophrenia and hypertension. CT R knee: Patellar tendon rupture. Patient was admitted to the hospital for R quad tenden rupture and traumatic rhabdomyolysis and he underwent a priomary repair of R quadriceps tendion rupture was performed 04/25/2019.  Partial weight bearing up to 50% on R LE with brace locked in extension.    Clinical Impression   Mr. Ethan Alvarez"Ethan Alvarez" Ethan Alvarez was seen for OT/PT co-evaluation/treatment on this date. Pt is POD#2 from above surgery. Per chart and pt report, Ethan Alvarez required assistance with ADLs and IADLs at baseline. He lives with his mother in a multi-level home with steps to reach his living space (basement). Pt and his mother require assistance at baseline and have PRN assist from PCAs who support ADL and IADL function for the pair. 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 Pt is currently a heavy +2 assist for bed mobility and transfers. During evaluation, pt endorsed being afraid of falling because of his injured leg. Pt educated on PWB status and importance of keeping knee immobilizer in place as well as falls prevention strategies and PLB during activity. Pt currently requires total assist for bed level toileting as well as max assist for other ADL tasks while seated EOB. Pt would benefit from skilled OT services including additional instruction in dressing techniques with or without assistive devices for dressing and bathing skills to support recall and carryover prior to  discharge and ultimately to maximize safety, independence, and minimize falls risk and caregiver burden. Recommend STR after hospital DC.     Follow Up Recommendations  SNF    Equipment Recommendations  (TBD)    Recommendations for Other Services       Precautions / Restrictions Precautions Precautions: Fall Required Braces or Orthoses: Knee Immobilizer - Right Restrictions Weight Bearing Restrictions: Yes RLE Weight Bearing: Partial weight bearing RLE Partial Weight Bearing Percentage or Pounds: 50% with brace Other Position/Activity Restrictions: patient verbalizes understanding after re-education       Mobility Bed Mobility Overal bed mobility: Needs Assistance Bed Mobility: Sit to Supine;Supine to Sit     Supine to sit: Min assist;Mod assist Sit to supine: Mod assist   General bed mobility comments: required help with legs and trunk; scoot up bed mod A +2 with draw sheet; sequencing with short simple commands and task orientation   Transfers Overall transfer level: Needs assistance Equipment used: (Bari walker. )   Sit to Stand: Mod assist         General transfer comment: unable to perform due to fatigue, LE shaking with exhaustion     Balance Overall balance assessment: Needs assistance Sitting-balance support: Bilateral upper extremity supported;Feet supported Sitting balance-Leahy Scale: Poor Sitting balance - Comments: sitting EOB requires CGA-Mod A for reaching inside BOS and lifting LE's.  Postural control: Posterior lean                                 ADL either performed or assessed  with clinical judgement   ADL Overall ADL's : Needs assistance/impaired Eating/Feeding: Set up;Sitting   Grooming: Sitting;Set up;Minimal assistance   Upper Body Bathing: Sitting;Set up;Moderate assistance;Minimal assistance   Lower Body Bathing: Set up;Maximal assistance;Sitting/lateral leans;+2 for safety/equipment;+2 for physical assistance    Upper Body Dressing : Set up;Sitting;Moderate assistance Upper Body Dressing Details (indicate cue type and reason): Pt unable to maintain sitting balance without min/mod support from therapist.  Lower Body Dressing: Sitting/lateral leans;+2 for physical assistance;Set up;Maximal assistance Lower Body Dressing Details (indicate cue type and reason): Pt very pain limited, and concerned about placing his leg on the floor. Provided re-education on PWB status during functional tasks while seated EOB.  Toilet Transfer: Total assistance Toilet Transfer Details (indicate cue type and reason): Pt currently requires bed-level toilet care total assist. Pt is heavy +2 for physical assist and safety.  Toileting- Clothing Manipulation and Hygiene: Set up;Bed level;Maximal assistance;Moderate assistance         General ADL Comments: Pt states he is "afraid to fall again" Does not want to hurt his leg further which limits participation in ADL tasks. Would significantly benefit from further education in AE for LB dressing and falls prevention education during ADL tasks.      Vision Baseline Vision/History: No visual deficits Patient Visual Report: Blurring of vision Additional Comments: Pt states blurry vision had resolved at time of eval, but had been present after surgery. Will continue to assess during functional tasks.      Perception     Praxis      Pertinent Vitals/Pain Pain Assessment: Faces Faces Pain Scale: Hurts worst Pain Location: right knee, patient states the red face, repeated measure with same results  Pain Descriptors / Indicators: Aching;Guarding;Grimacing Pain Intervention(s): Limited activity within patient's tolerance;Monitored during session;Patient requesting pain meds-RN notified;Repositioned;Utilized relaxation techniques     Hand Dominance     Extremity/Trunk Assessment Upper Extremity Assessment Upper Extremity Assessment: Generalized weakness;Difficult to assess due  to impaired cognition   Lower Extremity Assessment Lower Extremity Assessment: Defer to PT evaluation;RLE deficits/detail RLE Deficits / Details: R limited by knee brace locked into extension; PWB 50% RLE: Unable to fully assess due to immobilization   Cervical / Trunk Assessment Cervical / Trunk Assessment: Normal   Communication Communication Communication: No difficulties   Cognition Arousal/Alertness: Awake/alert Behavior During Therapy: WFL for tasks assessed/performed Overall Cognitive Status: No family/caregiver present to determine baseline cognitive functioning                                 General Comments: Per chart pt appears to have some cognitive delay at baseline with "borderline intellectual functioning"    General Comments  Abrasions/scratches on UE from fall. HR remained in 110's during functional activity. Reached 120's with bed mobility but decreased with cues for PLB.     Exercises   Other Exercises: prolonged seated balance EOB  Constant attendance required for safety with task orientation, cueing via tactile and verbal for proper alignment. visual and verbal cueing for amplitude of movement and direction of limb progression. Patient challenged more with left lean to reach for OT hand than with right lean. Other Exercises: rolling L and R with PT, OT, and CNAs for changing bedding/cleaning of patient upon initial enterance into room, required cueing and mod-max assistance.  Other Exercises: Pt educated in PLB as ECS and relaxation technique for non-pharmacological pain mgt.    Shoulder Instructions  Home Living Family/patient expects to be discharged to:: Private residence Living Arrangements: Parent(Lives with mother who also requires assistance. ) Available Help at Discharge: Personal care attendant;Available PRN/intermittently Type of Home: House Home Access: Level entry     Home Layout: Multi-level;Full bath on main level;Able to  live on main level with bedroom/bathroom;Other (Comment)(Pt lives in basement) Alternate Level Stairs-Number of Steps: reports 6 stairs to basement   Bathroom Shower/Tub: Tub/shower unit;Walk-in shower(walk in shower basement; tub shower main floor. )   Bathroom Toilet: Standard Bathroom Accessibility: No   Home Equipment: None          Prior Functioning/Environment Level of Independence: Needs assistance  Gait / Transfers Assistance Needed: ambulated I without AD ADL's / Homemaking Assistance Needed: required help with dressing, bathing, and meal prep. Reports PCAs assist with these tasks.    Comments: Patient reports prior to hospitalization he was I with ambulation community distances.         OT Problem List: Decreased strength;Impaired balance (sitting and/or standing);Decreased cognition;Decreased knowledge of precautions;Decreased range of motion;Decreased safety awareness;Cardiopulmonary status limiting activity;Obesity;Decreased activity tolerance;Decreased coordination;Decreased knowledge of use of DME or AE      OT Treatment/Interventions: Self-care/ADL training;Therapeutic exercise;Patient/family education;Energy conservation;Therapeutic activities;Cognitive remediation/compensation;Balance training    OT Goals(Current goals can be found in the care plan section) Acute Rehab OT Goals Patient Stated Goal: go to [facility] SNF for rehab, then return home OT Goal Formulation: With patient Time For Goal Achievement: 05/11/19 Potential to Achieve Goals: Good ADL Goals Pt Will Perform Grooming: with set-up;sitting;with min guard assist(With LRAD PRN for improved safety and functional independence.) Pt Will Perform Upper Body Bathing: with set-up;with min assist;sitting;with adaptive equipment(With LRAD PRN for improved safety and functional independence.) Pt Will Perform Upper Body Dressing: sitting;with set-up;with min guard assist(With LRAD PRN for improved safety and  functional independence.) Pt Will Perform Lower Body Dressing: with mod assist;sit to/from stand;with adaptive equipment;with set-up(With LRAD PRN for improved safety and functional independence.) Additional ADL Goal #1: Pt will independently verbalize a plan to implement at least 2 learned falls prevention stategies into his daily routines for improved safety and functional independene upon hospital DC.  OT Frequency: Min 1X/week   Barriers to D/C: Decreased caregiver support;Inaccessible home environment          Co-evaluation PT/OT/SLP Co-Evaluation/Treatment: Yes Reason for Co-Treatment: Complexity of the patient's impairments (multi-system involvement);Necessary to address cognition/behavior during functional activity;To address functional/ADL transfers PT goals addressed during session: Mobility/safety with mobility;Balance;Strengthening/ROM OT goals addressed during session: ADL's and self-care;Strengthening/ROM      AM-PAC OT "6 Clicks" Daily Activity     Outcome Measure Help from another person eating meals?: A Little Help from another person taking care of personal grooming?: A Lot Help from another person toileting, which includes using toliet, bedpan, or urinal?: Total Help from another person bathing (including washing, rinsing, drying)?: A Lot Help from another person to put on and taking off regular upper body clothing?: A Little Help from another person to put on and taking off regular lower body clothing?: A Lot 6 Click Score: 13   End of Session Equipment Utilized During Treatment: Oxygen Nurse Communication: Other (comment)(Pt reporting 10/10 pain.)  Activity Tolerance: Patient limited by pain;Patient limited by fatigue Patient left: in bed;with call bell/phone within reach;with bed alarm set;Other (comment)(With Knee immobilizer in place at start and end of session. )  OT Visit Diagnosis: Other abnormalities of gait and mobility (R26.89);Pain;History of falling  (Z91.81) Pain - Right/Left: Right Pain -  part of body: Leg                Time: 4098-1191 OT Time Calculation (min): 33 min Charges:  OT General Charges $OT Visit: 1 Visit OT Evaluation $OT Eval Moderate Complexity: 1 Mod OT Treatments $Self Care/Home Management : 8-22 mins  Rockney Ghee, M.S., OTR/L Ascom: [phone] 04/27/19, 12:35 PM

## 2019-04-27 NOTE — Progress Notes (Signed)
Physical Therapy Treatment Patient Details Name: Ethan Alvarez MRN: [id number] DOB: 02/01/1961 Today's Date: 04/27/2019    History of Present Illness Ethan Alvarez is a 58 y.o. male arrived at the ED by EMS on 04/25/2019 comlaining of R knee pain and inability to ambulate after an unwitnessed fall going down the stairs 04/24/2019. He was found on the floor by caregiver the morning following the fall. PMH includes schizophrenia and hypertension. CT R knee: Patellar tendon rupture. Patient was admitted to the hospital for R quad tenden rupture and traumatic rhabdomyolysis and he underwent a priomary repair of R quadriceps tendion rupture was performed 04/25/2019.  Partial weight bearing up to 50% on R LE with brace locked in extension.     PT Comments    Patient is pleasant and eager to participate in physical therapy session upon PT arrival. Patient did not have catheter in and nursing notified. Patient performed supine to sit with x2 (PT plus mobility tech) assistance however required less assistance than this morning more towards Min/Mod than Mod A. Patient required frequent verbal and tactile cueing for upright posture however did demonstrate longer capacity to maintain position without posterior LOB. Patient is challenged with dual tasking, requiring simple commands and frequent re-orientation to sequencing/positioning. Seated EOB interventions performed with patient fatiguing resulting in full body shaking from fatigue by end of session. Patient returned to bed and nursing notified of session completion. Current POC remains appropriate at this time.     Follow Up Recommendations  SNF     Equipment Recommendations  Rolling walker with 5" wheels;3in1 (PT);Wheelchair (measurements PT);Wheelchair cushion (measurements PT)    Recommendations for Other Services       Precautions / Restrictions Precautions Precautions: Fall Required Braces or Orthoses: Knee Immobilizer -  Right Restrictions Weight Bearing Restrictions: Yes RLE Weight Bearing: Partial weight bearing RLE Partial Weight Bearing Percentage or Pounds: 50% with brace Other Position/Activity Restrictions: patient verbalizes understanding after re-education     Mobility  Bed Mobility Overal bed mobility: Needs Assistance Bed Mobility: Sit to Supine;Supine to Sit     Supine to sit: Min assist;Mod assist Sit to supine: Mod assist   General bed mobility comments: required help with legs and trunk; scoot up bed min-mod A +2 with draw sheet; sequencing with short simple commands and task orientation   Transfers Overall transfer level: Needs assistance Equipment used: (Bari walker. )             General transfer comment: unable to perform due to fatigue, LE shaking with exhaustion catheter not in place  Ambulation/Gait                 Stairs             Wheelchair Mobility    Modified Rankin (Stroke Patients Only)       Balance Overall balance assessment: Needs assistance Sitting-balance support: Bilateral upper extremity supported;Feet supported Sitting balance-Leahy Scale: Poor Sitting balance - Comments: sitting EOB requires CGA-Mod A for reaching inside BOS and lifting LE's.  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  Other Exercises: prolonged seated balance EOB  Constant attendance required for safety with task orientation, cueing via tactile and  verbal for proper alignment. visual and verbal cueing for amplitude of movement and direction of limb progression.Patient required cueing for alignment :with use of hands to touch to see if too far back.  Other Exercises: seated: LAQ LLE 10x, marching 10x each LE (AAROM RLE). reaching for high fives to challenge static balance x 10, foot df/pf x 10.     General Comments General comments (skin integrity, edema, etc.): abrasions/scratches on UE from fall. HR remained 115 and below during functional activity.       Pertinent Vitals/Pain Pain Assessment: Faces Faces Pain Scale: Hurts worst Pain Location: right knee, patient states red face when asked Pain Descriptors / Indicators: Aching;Guarding;Grimacing Pain Intervention(s): Limited activity within patient's tolerance;Monitored during session;Repositioned    Home Living                      Prior Function            PT Goals (current goals can now be found in the care plan section) Acute Rehab PT Goals Patient Stated Goal: go to [facility] SNF for rehab, then return home PT Goal Formulation: With patient Time For Goal Achievement: 05/10/19 Potential to Achieve Goals: Good Progress towards PT goals: Progressing toward goals    Frequency    BID      PT Plan Current plan remains appropriate    Co-evaluation              AM-PAC PT "6 Clicks" Mobility   Outcome Measure  Help needed turning from your back to your side while in a flat bed without using bedrails?: A Little Help needed moving from lying on your back to sitting on the side of a flat bed without using bedrails?: A Little Help needed moving to and from a bed to a chair (including a wheelchair)?: A Lot Help needed standing up from a chair using your arms (e.g., wheelchair or bedside chair)?: A Lot Help needed to walk in hospital room?: Total Help needed climbing 3-5 steps with a railing? : Total 6 Click Score: 12    End of Session   Activity  Tolerance: Patient tolerated treatment well;Patient limited by fatigue Patient left: in bed;with call bell/phone within reach;with bed alarm set Nurse Communication: Mobility status;Weight bearing status;Other (comment)(patient performance, no cath noted? ) PT Visit Diagnosis: Unsteadiness on feet (R26.81);History of falling (Z91.81);Muscle weakness (generalized) (M62.81);Difficulty in walking, not elsewhere classified (R26.2)     Time: 0626-9485 PT Time Calculation (min) (ACUTE ONLY): 24 min  Charges:  $Therapeutic Exercise: 8-22 mins $Therapeutic Activity: 8-22 mins                     Precious Bard, PT, DPT     Precious Bard 04/27/2019, 4:08 PM

## 2019-04-27 NOTE — Progress Notes (Signed)
   Subjective: 2 Days Post-Op Procedure(s) (LRB): REPAIR QUADRICEP TENDON (Right) Patient reports pain as mild. Internal medicine consulted last night due to increased tachycardia. Reports that he is feeling much better today. Has had a bowel movement. Patient is well, and has had no acute complaints or problems Denies any CP, SOB, ABD pain. Continue with PT today.  Objective: Vital signs in last 24 hours: Temp:  [97.7 F (36.5 C)-98.9 F (37.2 C)] 98.9 F (37.2 C) (05/11 0351) Pulse Rate:  [100-112] 110 (05/11 0351) Resp:  [19-36] 19 (05/11 0351) BP: (120-159)/(64-81) 159/81 (05/11 0351) SpO2:  [91 %-97 %] 95 % (05/11 0351) Weight:  [130.4 kg] 130.4 kg (05/10 1942)  Intake/Output from previous day: 05/10 0701 - 05/11 0700 In: 2527 [P.O.:240; I.V.:2087; IV Piggyback:200] Out: 1750 [Urine:1750] Intake/Output this shift: No intake/output data recorded.  Recent Labs    04/25/19 1045  HGB 14.9   Recent Labs    04/25/19 1045  WBC 7.9  RBC 4.73  HCT 44.8  PLT 134*   Recent Labs    04/26/19 0442 04/27/19 0228  NA 137 138  K 4.4 4.1  CL 102 102  CO2 27 27  BUN 20 17  CREATININE 1.05 0.94  GLUCOSE 115* 101*  CALCIUM 7.8* 7.7*   No results for input(s): LABPT, INR in the last 72 hours.  EXAM General - Patient is Alert, Appropriate and Oriented Extremity - Neurovascular intact Sensation intact distally Intact pulses distally Dorsiflexion/Plantar flexion intact No cellulitis present Compartment soft  Knee hinged brace with Ace wrap applied to right knee. No drainage to the honeycomb dressing. Dressing - dressing C/D/I Motor Function - intact, moving foot and toes well on exam.  Sill mild distention of abdomen with normal BS.  Past Medical History:  Diagnosis Date  . Hypertension     Assessment/Plan:   2 Days Post-Op Procedure(s) (LRB): REPAIR QUADRICEP TENDON (Right) Active Problems:   Rupture of right quadriceps tendon  Estimated body mass  index is 41.25 kg/m as calculated from the following:   Height as of this encounter: 5\' 10"  (1.778 m).   Weight as of this encounter: 130.4 kg. Advance diet Up with therapy, partial weightbearing right lower extremity with knee hinged brace locked in extension  Reports feeling better today. Continue to monitor tachycardia.  Internal medicine consulted. Patient has had a BM since surgery. Up with therapy today. Care management to assist with discharge.  Plan for possible d/c to SNF today pending progress.  DVT Prophylaxis - Lovenox  J. Horris Latino, PA-C [facility] 04/27/2019, 7:41 AM

## 2019-04-28 LAB — CBC
HCT: 39 % (ref 39.0–52.0)
Hemoglobin: 12.4 g/dL — ABNORMAL LOW (ref 13.0–17.0)
MCH: 31.9 pg (ref 26.0–34.0)
MCHC: 31.8 g/dL (ref 30.0–36.0)
MCV: 100.3 fL — ABNORMAL HIGH (ref 80.0–100.0)
Platelets: 117 10*3/uL — ABNORMAL LOW (ref 150–400)
RBC: 3.89 MIL/uL — ABNORMAL LOW (ref 4.22–5.81)
RDW: 13.9 % (ref 11.5–15.5)
WBC: 4.4 10*3/uL (ref 4.0–10.5)
nRBC: 0 % (ref 0.0–0.2)

## 2019-04-28 LAB — BASIC METABOLIC PANEL
Anion gap: 9 (ref 5–15)
BUN: 15 mg/dL (ref 6–20)
CO2: 31 mmol/L (ref 22–32)
Calcium: 8.5 mg/dL — ABNORMAL LOW (ref 8.9–10.3)
Chloride: 101 mmol/L (ref 98–111)
Creatinine, Ser: 0.8 mg/dL (ref 0.61–1.24)
GFR calc Af Amer: >60 mL/min (ref >60)
GFR calc non Af Amer: >60 mL/min (ref >60)
Glucose, Bld: 91 mg/dL (ref 70–99)
Potassium: 4 mmol/L (ref 3.5–5.1)
Sodium: 141 mmol/L (ref 135–145)

## 2019-04-28 NOTE — Progress Notes (Signed)
Subjective: 3 Days Post-Op Procedure(s) (LRB): REPAIR QUADRICEP TENDON (Right) Patient reports pain as mild. Internal medicine consulted last night due to increased tachycardia, cleared by internal medicine for discharge when bed available. Reports that he is feeling much better today. Has had a bowel movement. Patient is well, and has had no acute complaints or problems Denies any CP, SOB, ABD pain. Continue with PT today.  Current plan is for discharge to rehab.  Objective: Vital signs in last 24 hours: Temp:  [97.8 F (36.6 C)-98.3 F (36.8 C)] 98.3 F (36.8 C) (05/12 0403) Pulse Rate:  [94-115] 99 (05/12 0403) Resp:  [19-22] 19 (05/12 0403) BP: (135-143)/(71-89) 136/71 (05/12 0403) SpO2:  [96 %-97 %] 97 % (05/12 0403)  Intake/Output from previous day: 05/11 0701 - 05/12 0700 In: 120 [P.O.:120] Out: 701 [Urine:700; Stool:1] Intake/Output this shift: No intake/output data recorded.  Recent Labs    04/25/19 1045  HGB 14.9   Recent Labs    04/25/19 1045  WBC 7.9  RBC 4.73  HCT 44.8  PLT 134*   Recent Labs    04/27/19 0228 04/28/19 0418  NA 138 141  K 4.1 4.0  CL 102 101  CO2 27 31  BUN 17 15  CREATININE 0.94 0.80  GLUCOSE 101* 91  CALCIUM 7.7* 8.5*   No results for input(s): LABPT, INR in the last 72 hours.  EXAM General - Patient is Alert, Appropriate and Oriented Extremity - Neurovascular intact Sensation intact distally Intact pulses distally Dorsiflexion/Plantar flexion intact No cellulitis present Compartment soft  Knee hinged brace with Ace wrap applied to right knee. No drainage to the honeycomb dressing. Dressing - dressing C/D/I Motor Function - intact, moving foot and toes well on exam.  Sill mild distention of abdomen with normal BS.  Past Medical History:  Diagnosis Date  . Hypertension     Assessment/Plan:   3 Days Post-Op Procedure(s) (LRB): REPAIR QUADRICEP TENDON (Right) Active Problems:   Rupture of right quadriceps  tendon  Estimated body mass index is 41.25 kg/m as calculated from the following:   Height as of this encounter: 5\' 10"  (1.778 m).   Weight as of this encounter: 130.4 kg. Advance diet Up with therapy, partial weightbearing right lower extremity with knee hinged brace locked in extension  States that he feels well this morning. Internal medicine consulted for tachycardia, clear for discharge when bed available. Patient has had a BM since surgery. Up with therapy today. Care management to assist with discharge. Plan for discharge to SNF today.  DVT Prophylaxis - Lovenox  J. Horris Latino, PA-C [facility] 04/28/2019, 7:52 AM

## 2019-04-28 NOTE — Progress Notes (Signed)
PT Cancellation Note  Patient Details Name: LAXMAN MARKOVIC MRN: [id number] DOB: 20-Oct-1961   Cancelled Treatment:    Reason Eval/Treat Not Completed: Other (comment)(Patient became confused and slightly agitated after ~5 minutes resulting in need to calm patient down. Let nursing know about patient's mood switch. ) Due to patient's medical history will attempt again at later time when patient has returned to pleasant baseline demeanor.    Precious Bard, PT, DPT   04/28/2019, 9:40 AM

## 2019-04-28 NOTE — Progress Notes (Signed)
EMS here to transport pt. 

## 2019-04-28 NOTE — Progress Notes (Signed)
Report called to [facility]. EMS called for transportation.

## 2019-04-28 NOTE — Progress Notes (Signed)
[facility] [place] [facility]   PATIENT NAME: Ethan Alvarez    MR#:  [id number]  DATE OF BIRTH:  08/06/1961  SUBJECTIVE:   patient plan to go to skilled nursing facility today.  No acute events overnight.  Denies shortness of breath.  REVIEW OF SYSTEMS:    Review of Systems  Constitutional: Negative for fever, chills weight loss HENT: Negative for ear pain, nosebleeds, congestion, facial swelling, rhinorrhea, neck pain, neck stiffness and ear discharge.   Respiratory: Negative for cough, shortness of breath, wheezing  Cardiovascular: Negative for chest pain, palpitations and leg swelling.  Gastrointestinal: Negative for heartburn, abdominal pain, vomiting, diarrhea or consitpation Genitourinary: Negative for dysuria, urgency, frequency, hematuria Musculoskeletal: Negative for back pain or joint pain Neurological: Negative for dizziness, seizures, syncope, focal weakness,  numbness and headaches.  Hematological: Does not bruise/bleed easily.  Psychiatric/Behavioral: Negative for hallucinations, confusion, dysphoric mood    Tolerating Diet: yes      DRUG ALLERGIES:   Allergies  Allergen Reactions  . Haloperidol Lactate Other (See Comments)    Per [facility]; nature of intolerance unclear intolerance Other reaction(s): Other (See Comments) Per [facility]; nature of intolerance unclear     VITALS:  Blood pressure 137/67, pulse (!) 112, temperature 98.7 F (37.1 C), temperature source Oral, resp. rate 19, height 5\' 10"  (1.778 m), weight 130.4 kg, SpO2 96 %.  PHYSICAL EXAMINATION:  Constitutional: Appears well-developed and well-nourished. No distress. HENT: Normocephalic. . Oropharynx is clear and moist.  Eyes: Conjunctivae and EOM are normal. PERRLA, no scleral icterus.  Neck: Normal ROM. Neck supple. No JVD. No tracheal deviation. CVS: RRR, S1/S2 +, no murmurs, no gallops, no carotid bruit.  Pulmonary: Effort and breath sounds normal, no stridor, rhonchi,  wheezes, rales.  Abdominal: Soft. BS +,  no distension, tenderness, rebound or guarding.  Musculoskeletal: Normal range of motion. No edema and no tenderness.  Neuro: Alert. CN 2-12 grossly intact. No focal deficits. Skin: Skin is warm and dry. No rash noted. Psychiatric: Normal mood and affect.      LABORATORY PANEL:   CBC Recent Labs  Lab 04/28/19 0418  WBC 4.4  HGB 12.4*  HCT 39.0  PLT 117*   ------------------------------------------------------------------------------------------------------------------  Chemistries  Recent Labs  Lab 04/28/19 0418  NA 141  K 4.0  CL 101  CO2 31  GLUCOSE 91  BUN 15  CREATININE 0.80  CALCIUM 8.5*   ------------------------------------------------------------------------------------------------------------------  Cardiac Enzymes Recent Labs  Lab 04/27/19 0228  TROPONINI <0.03   ------------------------------------------------------------------------------------------------------------------  RADIOLOGY:  Dg Chest 1 View  Result Date: 04/27/2019 CLINICAL DATA:  Shortness of breath EXAM: CHEST  1 VIEW COMPARISON:  12/29/2009 FINDINGS: The heart size and mediastinal contours are within normal limits. Both lungs are clear. The visualized skeletal structures are unremarkable. IMPRESSION: No active disease. Electronically Signed   By: Kevin  Herman M.D.   On: 04/27/2019 02:05   Ct Angio Chest Pe W Or Wo Contrast  Result Date: 04/27/2019 CLINICAL DATA:  57 year old male with acute onset shortness of breath and weakness. EXAM: CT ANGIOGRAPHY CHEST WITH CONTRAST TECHNIQUE: Multidetector CT imaging of the chest was performed using the standard protocol during bolus administration of intravenous contrast. Multiplanar CT image reconstructions and MIPs were obtained to evaluate the vascular anatomy. CONTRAST:  [id number] OMNIPAQUE IOHEXOL 350 MG/ML SOLN COMPARISON:  Portable chest earlier today.  Chest CT 12/27/2009. FINDINGS: Cardiovascular:  Aortic dominant contrast bolus timing despite 2 different attempts. Superimposed respiratory motion artifact. Central pulmonary artery timing is better  on the 2nd attempt. No central or hilar pulmonary artery filling defect is identified. No calcified coronary artery atherosclerosis is evident. No cardiomegaly or pericardial effusion. Negative visible aorta. Mediastinum/Nodes: Negative.  No lymphadenopathy. Lungs/Pleura: Major airways appear patent aside from atelectatic changes. There is enhancing platelike dependent atelectasis in the right lung. Similar enhancing atelectasis in the left lower lobe, costophrenic angle. Additional mild nonspecific dependent opacity in the left upper lobe. 4 millimeters subpleural nodule in the anterior right upper lobe on series 6, image 36 near the minor fissure. No pleural effusion or other abnormal pulmonary opacity. Upper Abdomen: Hepatic steatosis. Probable splenomegaly. Otherwise negative visible upper abdomen. Musculoskeletal: Multilevel mild and chronic appearing superior endplate compression fractures in the thoracic spine. Some of these might be chronic Schmorl's nodes. No acute osseous abnormality identified. Review of the MIP images confirms the above findings. IMPRESSION: 1. Limited by aortic dominant contrast timing and respiratory motion artifact despite 2 attempts. No central pulmonary embolus. Negative visible aorta. 2. Mild bilateral atelectasis. Small 4 mm right lung nodule and minimal other nonspecific dependent opacity in the left lung. No pleural effusion. No follow-up needed if patient is low-risk. Non-contrast chest CT can be considered in 12 months if patient is high-risk. This recommendation follows the consensus statement: Guidelines for Management of Incidental Pulmonary Nodules Detected on CT Images: From the Fleischner Society 2017; Radiology 2017; 284:228-243. 3. Hepatic steatosis and possible splenomegaly. Query chronic liver disease. Electronically  Signed   By: Odessa Fleming M.D.   On: 04/27/2019 04:13     ASSESSMENT AND PLAN:   58 year old male with history of schizophrenia who is postoperative day #3 for repair of quadricep tendon.  1.  Acute hypoxic respiratory distress: CT scan was negative for PE.  Chest x-ray did not show evidence of pulmonary edema.  Suspect this is related to atelectasis. Continue ISS.  2.  Schizophrenia: Patient should continue outpatient regimen  3.  Patient is postop day 3 status post quadricep and repair: Plan for discharge to skilled nursing facility as per orthopedic surgery.  I will sign off.  Patient is medically stable and cleared to be discharged to skilled nursing facility.    Management plans discussed with the patient and he is in agreement.  CODE STATUS: full  TOTAL TIME TAKING CARE OF THIS PATIENT: 25 minutes.     POSSIBLE D/C today, DEPENDING ON CLINICAL CONDITION.   Adrian Saran M.D on 04/28/2019 at 11:18 AM  Between 7am to 6pm - Pager - [phone] After 6pm go to [email or web address] - Social research officer, government  [facility]  Office  [phone]  CC: Primary care physician; Mickey Farber, MD  Note: This dictation was prepared with Dragon dictation along with smaller phrase technology. Any transcriptional errors that result from this process are unintentional.

## 2019-04-28 NOTE — Progress Notes (Signed)
PT Cancellation Note  Patient Details Name: Ethan Alvarez MRN: [id number] DOB: 01-Jan-1961   Cancelled Treatment:    Reason Eval/Treat Not Completed: Other (comment)(waiting transport to SNF ) Patient pending d/c with transport called.   Precious Bard, PT, DPT   04/28/2019, 5:01 PM

## 2019-04-28 NOTE — Care Management Important Message (Signed)
Important Message  Patient Details  Name: Ethan Alvarez MRN: [id number] Date of Birth: 03-May-1961   Medicare Important Message Given:  Yes    Olegario Messier A Nikki Glanzer 04/28/2019, 10:01 AM

## 2019-04-28 NOTE — Progress Notes (Signed)
[facility] requesting clarification on order for ammoxicillin (stop date), [facility] notified. Per Micah Noel pt does not need amoxicillin, can be discontinued. [facility] notified.

## 2019-04-28 NOTE — Progress Notes (Signed)
OT Cancellation Note  Patient Details Name: Ethan Alvarez MRN: [id number] DOB: 07-22-1961   Cancelled Treatment:    Reason Eval/Treat Not Completed: Other (comment). Pt became agitated upon PT's attempt to work with him. Will hold OT treatment this am and re-attempt at later date/time as pt is able to participate.   Richrd Prime, MPH, MS, OTR/L ascom [phone] 04/28/19, 10:40 AM

## 2019-04-28 NOTE — TOC Transition Note (Signed)
Transition of Care [facility]) - CM/SW Discharge Note   Patient Details  Name: Ethan Alvarez MRN: [id number] Date of Birth: [birth year]  Transition of [facility] CM/SW Contact:  Ruthe Mannan, LCSWA Phone Number: 04/28/2019, 1:42 PM   Clinical Narrative:   Patient is medically ready for discharge today. CSW notified patient's sister Genia Del of discharge today. [name]. CSW notified Raiford Noble at [facility] of discharge today. Patient will be transported by EMS. RN to call report and call for transport.     Final next level of care: Skilled Nursing Facility Barriers to Discharge: No Barriers Identified   Patient Goals and CMS Choice Patient states their goals for this hospitalization and ongoing recovery are:: The patient and his family  CMS Medicare.gov Compare Post Acute Care list provided to:: Patient Represenative (must comment) Choice offered to / list presented to : Sibling  Discharge Placement   Existing PASRR number confirmed : 04/27/19          Patient chooses bed at: [facility] Patient to be transferred to facility by: EMS Name of family member notified: Kennieth Francois  Patient and family notified of of transfer: 04/28/19  Discharge Plan and Services   Discharge Planning Services: CM Consult Post Acute Care Choice: Home Health                    HH Arranged: PT [facility] Agency: [facility] Date HH Agency Contacted: 04/26/19 Time HH Agency Contacted: 0911 Representative spoke with at [facility] Agency: teresa  Social Determinants of Health (SDOH) Interventions     Readmission Risk Interventions Readmission Risk Prevention Plan 04/26/2019  Medication Screening Complete  Transportation Screening Complete  Some recent data might be hidden

## 2019-06-01 ENCOUNTER — Emergency Department: Payer: Medicare Other

## 2019-06-01 ENCOUNTER — Encounter: Payer: Self-pay | Admitting: Internal Medicine

## 2019-06-01 ENCOUNTER — Inpatient Hospital Stay
Admission: EM | Admit: 2019-06-01 | Discharge: 2019-06-09 | DRG: 854 | Disposition: A | Discharge status: Skilled Nursing Facility | Payer: Medicare Other | Attending: Internal Medicine | Admitting: Internal Medicine

## 2019-06-01 ENCOUNTER — Other Ambulatory Visit: Payer: Self-pay

## 2019-06-01 DIAGNOSIS — Y838 Other surgical procedures as the cause of abnormal reaction of the patient, or of later complication, without mention of misadventure at the time of the procedure: Secondary | ICD-10-CM | POA: Diagnosis present

## 2019-06-01 DIAGNOSIS — Z8739 Personal history of other diseases of the musculoskeletal system and connective tissue: Secondary | ICD-10-CM | POA: Diagnosis not present

## 2019-06-01 DIAGNOSIS — R4182 Altered mental status, unspecified: Secondary | ICD-10-CM

## 2019-06-01 DIAGNOSIS — Z86010 Personal history of colonic polyps: Secondary | ICD-10-CM | POA: Diagnosis not present

## 2019-06-01 DIAGNOSIS — R4183 Borderline intellectual functioning: Secondary | ICD-10-CM | POA: Diagnosis present

## 2019-06-01 DIAGNOSIS — G4733 Obstructive sleep apnea (adult) (pediatric): Secondary | ICD-10-CM | POA: Diagnosis present

## 2019-06-01 DIAGNOSIS — Z1159 Encounter for screening for other viral diseases: Secondary | ICD-10-CM | POA: Diagnosis not present

## 2019-06-01 DIAGNOSIS — F319 Bipolar disorder, unspecified: Secondary | ICD-10-CM | POA: Diagnosis present

## 2019-06-01 DIAGNOSIS — Z79899 Other long term (current) drug therapy: Secondary | ICD-10-CM | POA: Diagnosis not present

## 2019-06-01 DIAGNOSIS — R7881 Bacteremia: Secondary | ICD-10-CM | POA: Diagnosis not present

## 2019-06-01 DIAGNOSIS — Z888 Allergy status to other drugs, medicaments and biological substances status: Secondary | ICD-10-CM

## 2019-06-01 DIAGNOSIS — S76111A Strain of right quadriceps muscle, fascia and tendon, initial encounter: Secondary | ICD-10-CM | POA: Diagnosis present

## 2019-06-01 DIAGNOSIS — W19XXXA Unspecified fall, initial encounter: Secondary | ICD-10-CM | POA: Diagnosis present

## 2019-06-01 DIAGNOSIS — M009 Pyogenic arthritis, unspecified: Secondary | ICD-10-CM | POA: Diagnosis present

## 2019-06-01 DIAGNOSIS — J9811 Atelectasis: Secondary | ICD-10-CM | POA: Diagnosis present

## 2019-06-01 DIAGNOSIS — L719 Rosacea, unspecified: Secondary | ICD-10-CM | POA: Diagnosis present

## 2019-06-01 DIAGNOSIS — M869 Osteomyelitis, unspecified: Secondary | ICD-10-CM | POA: Diagnosis present

## 2019-06-01 DIAGNOSIS — I119 Hypertensive heart disease without heart failure: Secondary | ICD-10-CM | POA: Diagnosis present

## 2019-06-01 DIAGNOSIS — T8131XA Disruption of external operation (surgical) wound, not elsewhere classified, initial encounter: Secondary | ICD-10-CM | POA: Diagnosis present

## 2019-06-01 DIAGNOSIS — R0602 Shortness of breath: Secondary | ICD-10-CM

## 2019-06-01 DIAGNOSIS — F209 Schizophrenia, unspecified: Secondary | ICD-10-CM | POA: Diagnosis present

## 2019-06-01 DIAGNOSIS — E669 Obesity, unspecified: Secondary | ICD-10-CM | POA: Diagnosis present

## 2019-06-01 DIAGNOSIS — F429 Obsessive-compulsive disorder, unspecified: Secondary | ICD-10-CM | POA: Diagnosis present

## 2019-06-01 DIAGNOSIS — L03115 Cellulitis of right lower limb: Secondary | ICD-10-CM | POA: Diagnosis present

## 2019-06-01 DIAGNOSIS — Z9181 History of falling: Secondary | ICD-10-CM | POA: Diagnosis not present

## 2019-06-01 DIAGNOSIS — A4101 Sepsis due to Methicillin susceptible Staphylococcus aureus: Secondary | ICD-10-CM | POA: Diagnosis present

## 2019-06-01 DIAGNOSIS — Z23 Encounter for immunization: Secondary | ICD-10-CM

## 2019-06-01 DIAGNOSIS — M25561 Pain in right knee: Secondary | ICD-10-CM | POA: Diagnosis present

## 2019-06-01 DIAGNOSIS — Z6838 Body mass index (BMI) 38.0-38.9, adult: Secondary | ICD-10-CM | POA: Diagnosis not present

## 2019-06-01 DIAGNOSIS — B9561 Methicillin susceptible Staphylococcus aureus infection as the cause of diseases classified elsewhere: Secondary | ICD-10-CM | POA: Diagnosis not present

## 2019-06-01 DIAGNOSIS — Z978 Presence of other specified devices: Secondary | ICD-10-CM | POA: Diagnosis not present

## 2019-06-01 DIAGNOSIS — M00061 Staphylococcal arthritis, right knee: Secondary | ICD-10-CM | POA: Diagnosis not present

## 2019-06-01 DIAGNOSIS — I10 Essential (primary) hypertension: Secondary | ICD-10-CM | POA: Diagnosis not present

## 2019-06-01 DIAGNOSIS — M65161 Other infective (teno)synovitis, right knee: Secondary | ICD-10-CM | POA: Diagnosis not present

## 2019-06-01 DIAGNOSIS — T8149XA Infection following a procedure, other surgical site, initial encounter: Secondary | ICD-10-CM | POA: Diagnosis not present

## 2019-06-01 LAB — CBC WITH DIFFERENTIAL/PLATELET
Abs Immature Granulocytes: 0.08 10*3/uL — ABNORMAL HIGH (ref 0.00–0.07)
Basophils Absolute: 0 10*3/uL (ref 0.0–0.1)
Basophils Relative: 0 %
Eosinophils Absolute: 0.1 10*3/uL (ref 0.0–0.5)
Eosinophils Relative: 1 %
HCT: 41.1 % (ref 39.0–52.0)
Hemoglobin: 13.7 g/dL (ref 13.0–17.0)
Immature Granulocytes: 1 %
Lymphocytes Relative: 9 %
Lymphs Abs: 0.8 10*3/uL (ref 0.7–4.0)
MCH: 31.8 pg (ref 26.0–34.0)
MCHC: 33.3 g/dL (ref 30.0–36.0)
MCV: 95.4 fL (ref 80.0–100.0)
Monocytes Absolute: 1.5 10*3/uL — ABNORMAL HIGH (ref 0.1–1.0)
Monocytes Relative: 17 %
Neutro Abs: 6.6 10*3/uL (ref 1.7–7.7)
Neutrophils Relative %: 72 %
Platelets: 83 10*3/uL — ABNORMAL LOW (ref 150–400)
RBC: 4.31 MIL/uL (ref 4.22–5.81)
RDW: 13.4 % (ref 11.5–15.5)
Smear Review: DECREASED
WBC: 9.2 10*3/uL (ref 4.0–10.5)
nRBC: 0 % (ref 0.0–0.2)

## 2019-06-01 LAB — SARS CORONAVIRUS 2 BY RT PCR (HOSPITAL ORDER, PERFORMED IN ~~LOC~~ HOSPITAL LAB): SARS Coronavirus 2: NEGATIVE

## 2019-06-01 LAB — COMPREHENSIVE METABOLIC PANEL
ALT: 13 U/L (ref 0–44)
AST: 15 U/L (ref 15–41)
Albumin: 2.4 g/dL — ABNORMAL LOW (ref 3.5–5.0)
Alkaline Phosphatase: 39 U/L (ref 38–126)
Anion gap: 8 (ref 5–15)
BUN: 9 mg/dL (ref 6–20)
CO2: 19 mmol/L — ABNORMAL LOW (ref 22–32)
Calcium: 6 mg/dL — CL (ref 8.9–10.3)
Chloride: 110 mmol/L (ref 98–111)
Creatinine, Ser: 0.59 mg/dL — ABNORMAL LOW (ref 0.61–1.24)
GFR calc Af Amer: >60 mL/min (ref >60)
GFR calc non Af Amer: >60 mL/min (ref >60)
Glucose, Bld: 95 mg/dL (ref 70–99)
Potassium: 3.1 mmol/L — ABNORMAL LOW (ref 3.5–5.1)
Sodium: 137 mmol/L (ref 135–145)
Total Bilirubin: 0.7 mg/dL (ref 0.3–1.2)
Total Protein: 4.8 g/dL — ABNORMAL LOW (ref 6.5–8.1)

## 2019-06-01 LAB — TROPONIN I: Troponin I: <0.03 ng/mL (ref <0.03)

## 2019-06-01 LAB — LACTIC ACID, PLASMA: Lactic Acid, Venous: 1.1 mmol/L (ref 0.5–1.9)

## 2019-06-01 LAB — SEDIMENTATION RATE: Sed Rate: 61 mm/hr — ABNORMAL HIGH (ref 0–20)

## 2019-06-01 LAB — MAGNESIUM: Magnesium: 2 mg/dL (ref 1.7–2.4)

## 2019-06-01 MED ORDER — BENZTROPINE MESYLATE 1 MG PO TABS
1.0000 mg | ORAL_TABLET | Freq: Two times a day (BID) | ORAL | Status: DC
  Administered 2019-06-02 – 2019-06-09 (×15): 1 mg via ORAL
  Filled 2019-06-01 (×17): qty 1

## 2019-06-01 MED ORDER — CALCIUM GLUCONATE-NACL 1-0.675 GM/50ML-% IV SOLN
1.0000 g | Freq: Once | INTRAVENOUS | Status: AC
  Administered 2019-06-01: 1000 mg via INTRAVENOUS
  Filled 2019-06-01: qty 50

## 2019-06-01 MED ORDER — ACETAMINOPHEN 650 MG RE SUPP: 650.0000 mg | Freq: Four times a day (QID) | RECTAL | Status: DC | PRN

## 2019-06-01 MED ORDER — VANCOMYCIN HCL 1.5 G IV SOLR
1500.0000 mg | Freq: Two times a day (BID) | INTRAVENOUS | Status: DC
  Administered 2019-06-02: 1500 mg via INTRAVENOUS
  Filled 2019-06-01 (×2): qty 1500

## 2019-06-01 MED ORDER — ONDANSETRON HCL 4 MG PO TABS: 4.0000 mg | ORAL_TABLET | Freq: Four times a day (QID) | ORAL | Status: DC | PRN

## 2019-06-01 MED ORDER — LORAZEPAM 1 MG PO TABS
1.0000 mg | ORAL_TABLET | Freq: Two times a day (BID) | ORAL | Status: DC
  Administered 2019-06-02 – 2019-06-09 (×15): 1 mg via ORAL
  Filled 2019-06-01 (×15): qty 1

## 2019-06-01 MED ORDER — ENOXAPARIN SODIUM 40 MG/0.4ML ~~LOC~~ SOLN
40.0000 mg | SUBCUTANEOUS | Status: DC
  Administered 2019-06-02: 01:00:00 40 mg via SUBCUTANEOUS
  Filled 2019-06-01: qty 0.4

## 2019-06-01 MED ORDER — LORAZEPAM 1 MG PO TABS
1.0000 mg | ORAL_TABLET | Freq: Once | ORAL | Status: AC
  Administered 2019-06-01: 1 mg via ORAL
  Filled 2019-06-01: qty 1

## 2019-06-01 MED ORDER — SODIUM CHLORIDE 0.9% FLUSH
3.0000 mL | Freq: Two times a day (BID) | INTRAVENOUS | Status: DC
  Administered 2019-06-02 – 2019-06-09 (×10): 3 mL via INTRAVENOUS

## 2019-06-01 MED ORDER — VANCOMYCIN HCL 10 G IV SOLR
1500.0000 mg | Freq: Two times a day (BID) | INTRAVENOUS | Status: DC
  Filled 2019-06-01: qty 1500

## 2019-06-01 MED ORDER — SODIUM CHLORIDE 0.9 % IV SOLN
2.0000 g | Freq: Three times a day (TID) | INTRAVENOUS | Status: DC
  Administered 2019-06-01 – 2019-06-03 (×5): 2 g via INTRAVENOUS
  Filled 2019-06-01 (×9): qty 2

## 2019-06-01 MED ORDER — CLONAZEPAM 0.5 MG PO TABS
0.5000 mg | ORAL_TABLET | Freq: Three times a day (TID) | ORAL | Status: DC | PRN
  Administered 2019-06-03 – 2019-06-07 (×4): 0.5 mg via ORAL
  Filled 2019-06-01 (×4): qty 1

## 2019-06-01 MED ORDER — BENZTROPINE MESYLATE 0.5 MG PO TABS
0.5000 mg | ORAL_TABLET | ORAL | Status: DC
  Administered 2019-06-02 – 2019-06-08 (×8): 0.5 mg via ORAL
  Filled 2019-06-01 (×8): qty 1

## 2019-06-01 MED ORDER — OXYCODONE HCL 5 MG PO TABS: 5.0000 mg | ORAL_TABLET | ORAL | Status: DC | PRN

## 2019-06-01 MED ORDER — SERTRALINE HCL 50 MG PO TABS
100.0000 mg | ORAL_TABLET | Freq: Two times a day (BID) | ORAL | Status: DC
  Administered 2019-06-02 – 2019-06-09 (×16): 100 mg via ORAL
  Filled 2019-06-01 (×16): qty 2

## 2019-06-01 MED ORDER — OLANZAPINE 5 MG PO TABS
15.0000 mg | ORAL_TABLET | Freq: Every day | ORAL | Status: DC
  Administered 2019-06-02 – 2019-06-09 (×8): 15 mg via ORAL
  Filled 2019-06-01 (×7): qty 3
  Filled 2019-06-01: qty 1

## 2019-06-01 MED ORDER — SENNA 8.6 MG PO TABS
1.0000 | ORAL_TABLET | Freq: Two times a day (BID) | ORAL | Status: DC
  Administered 2019-06-02 – 2019-06-09 (×16): 8.6 mg via ORAL
  Filled 2019-06-01 (×16): qty 1

## 2019-06-01 MED ORDER — ALBUTEROL SULFATE (2.5 MG/3ML) 0.083% IN NEBU
INHALATION_SOLUTION | RESPIRATORY_TRACT | Status: AC
  Administered 2019-06-01: 22:00:00 2.5 mg
  Filled 2019-06-01: qty 3

## 2019-06-01 MED ORDER — PROPRANOLOL HCL 20 MG PO TABS
20.0000 mg | ORAL_TABLET | Freq: Every day | ORAL | Status: DC
  Administered 2019-06-02 – 2019-06-09 (×8): 20 mg via ORAL
  Filled 2019-06-01 (×8): qty 1

## 2019-06-01 MED ORDER — METHYLPREDNISOLONE SODIUM SUCC 125 MG IJ SOLR
125.0000 mg | Freq: Once | INTRAMUSCULAR | Status: AC
  Administered 2019-06-02: 01:00:00 125 mg via INTRAVENOUS
  Filled 2019-06-01: qty 2

## 2019-06-01 MED ORDER — DIVALPROEX SODIUM 500 MG PO DR TAB
1000.0000 mg | DELAYED_RELEASE_TABLET | Freq: Two times a day (BID) | ORAL | Status: DC
  Administered 2019-06-02 – 2019-06-09 (×16): 1000 mg via ORAL
  Filled 2019-06-01 (×17): qty 2

## 2019-06-01 MED ORDER — ONDANSETRON HCL 4 MG/2ML IJ SOLN: 4.0000 mg | Freq: Four times a day (QID) | INTRAMUSCULAR | Status: DC | PRN

## 2019-06-01 MED ORDER — IPRATROPIUM-ALBUTEROL 0.5-2.5 (3) MG/3ML IN SOLN: 3.0000 mL | RESPIRATORY_TRACT | Status: DC | PRN

## 2019-06-01 MED ORDER — POLYETHYLENE GLYCOL 3350 17 G PO PACK: 17.0000 g | PACK | Freq: Every day | ORAL | Status: DC | PRN

## 2019-06-01 MED ORDER — PNEUMOCOCCAL VAC POLYVALENT 25 MCG/0.5ML IJ INJ
0.5000 mL | INJECTION | INTRAMUSCULAR | Status: AC
  Administered 2019-06-02: 08:00:00 0.5 mL via INTRAMUSCULAR
  Filled 2019-06-01: qty 0.5

## 2019-06-01 MED ORDER — LORAZEPAM 2 MG/ML IJ SOLN
INTRAMUSCULAR | Status: AC
  Filled 2019-06-01: qty 1

## 2019-06-01 MED ORDER — SODIUM CHLORIDE 0.9 % IV SOLN: 1.0000 g | Freq: Once | INTRAVENOUS | Status: DC

## 2019-06-01 MED ORDER — ACETAMINOPHEN 325 MG PO TABS
650.0000 mg | ORAL_TABLET | Freq: Four times a day (QID) | ORAL | Status: DC | PRN
  Administered 2019-06-01: 22:00:00 650 mg via ORAL
  Filled 2019-06-01: qty 2

## 2019-06-01 MED ORDER — IOHEXOL 350 MG/ML SOLN
75.0000 mL | Freq: Once | INTRAVENOUS | Status: AC | PRN
  Administered 2019-06-01: 75 mL via INTRAVENOUS

## 2019-06-01 MED ORDER — VANCOMYCIN HCL 10 G IV SOLR
2500.0000 mg | Freq: Once | INTRAVENOUS | Status: AC
  Administered 2019-06-01: 20:00:00 2500 mg via INTRAVENOUS
  Filled 2019-06-01: qty 2500

## 2019-06-01 MED ORDER — POTASSIUM CHLORIDE CRYS ER 20 MEQ PO TBCR
40.0000 meq | EXTENDED_RELEASE_TABLET | Freq: Once | ORAL | Status: AC
  Administered 2019-06-01: 20:00:00 40 meq via ORAL
  Filled 2019-06-01: qty 2

## 2019-06-01 MED ORDER — ALBUTEROL SULFATE (2.5 MG/3ML) 0.083% IN NEBU: 2.5000 mg | INHALATION_SOLUTION | RESPIRATORY_TRACT | Status: DC | PRN

## 2019-06-01 NOTE — Consult Note (Signed)
ORTHOPAEDIC CONSULTATION  REQUESTING PHYSICIAN: Harvest Dark, MD  Chief Complaint: Right knee pain and swelling  HPI: Ethan Alvarez is a 58 y.o. male who presents to the ER with shortness of breath and confusion.  Patient is noted to have right knee swelling, erythema and wound dehiscence.  Patient is status post quadriceps tendon repair on 04/25/2019 by Dr. Elenore Rota.  Patient cannot give an accurate history secondary to confusion.  The ER physician states the patient has had a recent fall last week.  Patient has been in a hinged knee brace.    Past Medical History:  Diagnosis Date  . Hypertension    Past Surgical History:  Procedure Laterality Date  . QUADRICEPS TENDON REPAIR Right 04/25/2019   Procedure: REPAIR QUADRICEP TENDON;  Surgeon: Corky Mull, MD;  Location: [facility] ORS;  Service: Orthopedics;  Laterality: Right;   Social History   Socioeconomic History  . Marital status: Single    Spouse name: Not on file  . Number of children: Not on file  . Years of education: Not on file  . Highest education level: Not on file  Occupational History  . Not on file  Social Needs  . Financial resource strain: Not on file  . Food insecurity    Worry: Not on file    Inability: Not on file  . Transportation needs    Medical: Not on file    Non-medical: Not on file  Tobacco Use  . Smoking status: Never Smoker  . Smokeless tobacco: Never Used  Substance and Sexual Activity  . Alcohol use: Not on file  . Drug use: Not on file  . Sexual activity: Not on file  Lifestyle  . Physical activity    Days per week: Not on file    Minutes per session: Not on file  . Stress: Not on file  Relationships  . Social Herbalist on phone: Not on file    Gets together: Not on file    Attends religious service: Not on file    Active member of club or organization: Not on file    Attends meetings of clubs or organizations: Not on file    Relationship status: Not on file  Other  Topics Concern  . Not on file  Social History Narrative  . Not on file   No family history on file. Allergies  Allergen Reactions  . Haloperidol Lactate Other (See Comments)    Per [facility]; nature of intolerance unclear intolerance Other reaction(s): Other (See Comments) Per [facility]; nature of intolerance unclear    Prior to Admission medications   Medication Sig Start Date End Date Taking? Authorizing Provider  acetaminophen (TYLENOL) 325 MG tablet Take 650 mg by mouth every 4 (four) hours as needed for mild pain. 03/21/17  Yes [provider]  benztropine (COGENTIN) 0.5 MG tablet Take 0.5 mg by mouth every morning.    Yes [provider]  benztropine (COGENTIN) 1 MG tablet Take 1 mg by mouth 2 (two) times daily.  03/25/19  Yes [provider]  clonazePAM (KLONOPIN) 0.5 MG tablet Take 0.5 mg by mouth 3 (three) times daily as needed for agitation. 04/08/19  Yes [provider]  divalproex (DEPAKOTE) 500 MG DR tablet Take 1,000 mg by mouth 2 (two) times a day. 02/13/17 07/23/19 Yes [provider]  INVEGA SUSTENNA 234 MG/1.5ML SUSY injection Inject 234 mg into the muscle every 30 (thirty) days. 04/10/19  Yes [provider]  ketoconazole (  NIZORAL) 2 % cream Apply 1 application topically. To groin and surrounding areas twice daily until resolved   Yes [provider]  LORazepam (ATIVAN) 0.5 MG tablet Take 0.5 mg by mouth every morning.    Yes [provider]  LORazepam (ATIVAN) 0.5 MG tablet Take 0.5 mg by mouth daily as needed for anxiety.   Yes [provider]  LORazepam (ATIVAN) 1 MG tablet Take 1 mg by mouth 2 (two) times daily.  01/23/19  Yes [provider]  metroNIDAZOLE (METROGEL) 0.75 % gel Apply 1 application topically daily. 04/04/19  Yes [provider]  OLANZapine (ZYPREXA) 15 MG tablet Take 15 mg by mouth daily. 03/25/19 07/23/19 Yes [provider]  oxyCODONE (OXY IR/ROXICODONE) 5 MG  immediate release tablet Take 1-2 tablets (5-10 mg total) by mouth every 4 (four) hours as needed for moderate pain. 04/27/19  Yes Anson OregonMcGhee, James Lance, PA-C  propranolol (INDERAL) 20 MG tablet Take 20 mg by mouth daily.  08/12/18  Yes [provider]  sertraline (ZOLOFT) 100 MG tablet Take 100 mg by mouth 2 (two) times a day. 01/30/18 07/23/19 Yes [provider]  enoxaparin (LOVENOX) 40 MG/0.4ML injection Inject 0.4 mLs (40 mg total) into the skin daily. Patient not taking: Reported on 06/01/2019 04/27/19   Anson OregonMcGhee, James Lance, PA-C   Dg Chest Portable 1 View  Result Date: 06/01/2019 CLINICAL DATA:  Hypertension EXAM: PORTABLE CHEST 1 VIEW COMPARISON:  04/27/2019 FINDINGS: The heart size is mildly enlarged but stable. There is bibasilar atelectasis. No pneumothorax. No large pleural effusion. No acute osseous abnormality. IMPRESSION: No active disease. Electronically Signed   By: Katherine Mantlehristopher  Green M.D.   On: 06/01/2019 15:45   Dg Knee Complete 4 Views Right  Result Date: 06/01/2019 CLINICAL DATA:  Right knee pain. EXAM: RIGHT KNEE - COMPLETE 4+ VIEW COMPARISON:  04/25/2019 FINDINGS: No evidence of fracture, or dislocation. Postsurgical changes from quadriceps tendon repair. Suprapatellar soft tissue swelling. No evidence of arthropathy . IMPRESSION: 1. No acute fracture or dislocation identified about the right knee. 2. Suprapatellar soft tissue swelling in the region of the quadriceps tendon repair and associated subcutaneous edema. Infectious etiology cannot be excluded. Electronically Signed   By: Ted Mcalpineobrinka  Dimitrova M.D.   On: 06/01/2019 16:10    Positive ROS: All other systems have been reviewed and were otherwise negative with the exception of those mentioned in the HPI and as above.  Physical Exam: General: Awake, confused no acute distress  MUSCULOSKELETAL: Right knee: Patient has approximately 2-4 centimeters of dehiscence in the mid portion of the incision.  There is  significant swelling around the right knee with anterior erythema.  It is difficult to assess whether there is a knee effusion.  No active drainage is seen from the wound dehiscence.  Patient has difficulty following commands on exam but does not appear to be able to actively extend his knee.  Patient has global tenderness.  He has edema in the lower leg but the erythema does not extend into the lower leg.  Patient states he has intact sensation to light touch.  He can dorsiflex and plantarflex his ankle.  Patient's lower leg appears well perfused.  Assessment: Right knee pain, erythema and wound dehiscence following open quadriceps tendon repair 5 weeks ago.  Plan: Patient is being admitted to the hospital service for further work-up of his shortness of breath and confusion.  CT of the chest is pending.  Empiric antibiotics will be started for possible cellulitis versus wound  infection of the right knee.  I am ordering a MRI of the right knee to evaluate the integrity of the quadriceps tendon repair given that the patient is having difficulty actively extending his knee.  Patient should have a clean dressing applied to the right knee.  Knee immobilizer may replace his brace at this time.  Patient should continue to elevate the right lower extremity.  Ice may be applied to reduce swelling.  I have discussed this case with Dr. Ninfa LindenJeff Poggi who will see the patient in the morning for further evaluation and management.   Juanell FairlyKevin Emmylou Bieker, MD    06/01/2019 5:54 PM

## 2019-06-01 NOTE — ED Provider Notes (Signed)
[facility] Emergency Department Provider Note  Time seen: 3:16 PM  I have reviewed the triage vital signs and the nursing notes.   HISTORY  Chief Complaint Shortness of breath    HPI Ethan Alvarez is a 58 y.o. male with a past medical history of hypertension, schizophrenia, recent right knee surgery presents from [facility] for shortness of breath, reports of confusion.  Upon arrival to the emergency department patient appears overall well, no acute distress.  Patient is awake alert and oriented at this time.  Patient states he did have a fall several days ago, and has increased pain in the right knee.  Patient currently has a knee immobilizer, in reviewing the patient's records it appeared that he had a tendon repair 04/25/2019.  Per EMS report the main call out today was for reported shortness of breath.  Patient denies any shortness of breath at this time but does have an occasional cough, is tachypneic to 30 breaths/min.  Overall largely negative review of systems per patient.  There was some report of confusion however the patient is oriented x3 at this time, record review does show a history of schizophrenia as well.  Family member has power of attorney.    Past Medical History:  Diagnosis Date  . Hypertension     Patient Active Problem List   Diagnosis Date Noted  . Rupture of right quadriceps tendon 04/25/2019    Past Surgical History:  Procedure Laterality Date  . QUADRICEPS TENDON REPAIR Right 04/25/2019   Procedure: REPAIR QUADRICEP TENDON;  Surgeon: Corky Mull, MD;  Location: [facility] ORS;  Service: Orthopedics;  Laterality: Right;    Prior to Admission medications   Medication Sig Start Date End Date Taking? Authorizing Provider  acetaminophen (TYLENOL) 325 MG tablet Take 650 mg by mouth every 4 (four) hours as needed for mild pain. 03/21/17   [provider]  amoxicillin (AMOXIL) 500 MG capsule Take 500 mg by  mouth 3 (three) times daily. 04/21/19   [provider]  benztropine (COGENTIN) 0.5 MG tablet Take 0.5 mg by mouth daily. 03/25/19   [provider]  benztropine (COGENTIN) 1 MG tablet Take 1 mg by mouth 2 (two) times a day. 09/13/14   [provider]  clonazePAM (KLONOPIN) 0.5 MG tablet Take 0.5 mg by mouth 3 (three) times daily as needed for agitation. 04/08/19   [provider]  divalproex (DEPAKOTE) 500 MG DR tablet Take 1,000 mg by mouth 2 (two) times a day. 02/13/17 07/23/19  [provider]  enoxaparin (LOVENOX) 40 MG/0.4ML injection Inject 0.4 mLs (40 mg total) into the skin daily. 04/27/19   Lattie Corns, PA-C  INVEGA SUSTENNA 234 MG/1.5ML SUSY injection Inject 234 mg into the muscle every 30 (thirty) days. 04/10/19   [provider]  LORazepam (ATIVAN) 0.5 MG tablet Take 0.5 mg by mouth every morning.     [provider]  LORazepam (ATIVAN) 1 MG tablet Take 1 mg by mouth 2 (two) times daily. May take an additional dose as needed for agitation 01/23/19   [provider]  metroNIDAZOLE (METROGEL) 0.75 % gel Apply 1 application topically daily. 04/04/19   [provider]  OLANZapine (ZYPREXA) 15 MG tablet Take 15 mg by mouth daily. 03/25/19 07/23/19  [provider]  oxyCODONE (OXY IR/ROXICODONE) 5 MG immediate release tablet Take 1-2 tablets (5-10 mg total) by mouth every 4 (four) hours as needed for moderate pain. 04/27/19  Lattie Corns, PA-C  propranolol (INDERAL) 20 MG tablet Take 20 mg by mouth daily.  08/12/18   [provider]  sertraline (ZOLOFT) 100 MG tablet Take 100 mg by mouth 2 (two) times a day. 01/30/18 07/23/19  [provider]    Allergies  Allergen Reactions  . Haloperidol Lactate Other (See Comments)    Per [facility]; nature of intolerance unclear intolerance Other reaction(s): Other (See Comments) Per [facility]; nature of intolerance unclear     No family history on  file.  Social History Social History   Tobacco Use  . Smoking status: Never Smoker  . Smokeless tobacco: Never Used  Substance Use Topics  . Alcohol use: Not on file  . Drug use: Not on file    Review of Systems Constitutional: Negative for fever. Cardiovascular: Negative for chest pain. Respiratory: Reported shortness of breath at his nursing facility.  Positive for cough Gastrointestinal: Negative for abdominal pain, vomiting Genitourinary: Negative for urinary compaints Musculoskeletal: Right knee pain Skin: Redness of the right knee Neurological: Negative for headache All other ROS negative  ____________________________________________   PHYSICAL EXAM:  VITAL SIGNS: ED Triage Vitals  Enc Vitals Group     BP      Pulse      Resp      Temp      Temp src      SpO2      Weight      Height      Head Circumference      Peak Flow      Pain Score      Pain Loc      Pain Edu?      Excl. in [place]?     Constitutional: Patient is awake and alert, oriented x3.  No acute distress. Eyes: Normal exam ENT      Head: Normocephalic and atraumatic.      Mouth/Throat: Mucous membranes are moist. Cardiovascular: Normal rate, regular rhythm. Respiratory: Patient is tachypneic, lung sounds are clear bilaterally.  Occasional cough during my exam. Gastrointestinal: Soft and nontender. No distention.  Obese. Musculoskeletal: Knee immobilizer removed showing a somewhat swollen and erythematous right knee, there is a incision with mild dehiscence. Neurologic:  Normal speech and language. No gross focal neurologic deficits Skin: Mild dehiscence of surgical incision approximately 1.5 cm in length Psychiatric: Mood and affect are normal.  ____________________________________________    EKG  EKG viewed and interpreted by myself shows a normal sinus rhythm at 96 bpm with a narrow QRS, normal axis, normal intervals, no concerning ST  changes  ____________________________________________    RADIOLOGY  Chest x-ray negative Knee x-ray shows no acute fracture dislocation.  There is soft tissue swelling in the area of the quadriceps tendon repair cannot rule out infectious etiology.  ____________________________________________   INITIAL IMPRESSION / ASSESSMENT AND PLAN / ED COURSE  Pertinent labs & imaging results that were available during my care of the patient were reviewed by me and considered in my medical decision making (see chart for details).   Patient presents emergency department with reported shortness of breath from his nursing facility.  Patient is in a right knee immobilizer.  Does have some redness and swelling of the right knee.  Differential would include right knee injury, infection, DVT, PE, ACS, pulmonary edema, pneumonia, COVID.  We will check labs, cultures, ESR.  Patient will likely require CTA of the chest given his tachypnea and knee immobilizer.  We will obtain x-ray imaging of the knee  and continue to closely monitor.  Patient agreeable to plan.  Patient's lab work has resulted significant showing a hypocalcemia level less than detectable.  Even at 6.0 corrected for albumin it is still 7.4 consistent with severe hypocalcemia.  We will start the patient on IV calcium gluconate, patient will require admission to the hospitalist service for his hypocalcemia treatment.  Given the patient's reported shortness of breath as well as knee immobilizer CTA of the chest is pending.  I discussed the patient with Dr. Mack Guise of orthopedics he will have Dr. Arnette Schaumann or himself see the patient once admitted for consultation of the right knee swelling.  ZERICK PREVETTE was evaluated in Emergency Department on 06/01/2019 for the symptoms described in the history of present illness. He was evaluated in the context of the global COVID-19 pandemic, which necessitated consideration that the patient might be at risk for  infection with the SARS-CoV-2 virus that causes COVID-19. Institutional protocols and algorithms that pertain to the evaluation of patients at risk for COVID-19 are in a state of rapid change based on information released by regulatory bodies including the CDC and federal and state organizations. These policies and algorithms were followed during the patient's care in the ED.  ____________________________________________   FINAL CLINICAL IMPRESSION(S) / ED DIAGNOSES  Dyspnea hypocalcemia   Harvest Dark, MD 06/01/19 1750

## 2019-06-01 NOTE — Consult Note (Signed)
Pharmacy Antibiotic Note  Ethan Alvarez is a 58 y.o. male admitted on 06/01/2019 with a wound infection.   Pharmacy has been consulted for Vancomycin/Cefepime dosing.  Plan: Will give Cefepime 2g IV q 8hr  Will give 2500mg  loading dose in the ED, followed by  Vancomycin 1500 mg IV Q 12 hrs. Goal AUC 400-550. Expected AUC: 490 SCr used: 0.8   Height: 5\' 9"  (175.3 cm) Weight: 300 lb (136.1 kg) IBW/kg (Calculated) : 70.7  Temp (24hrs), Avg:97.3 F (36.3 C), Min:97.3 F (36.3 C), Max:97.3 F (36.3 C)  Recent Labs  Lab 06/01/19 1513 06/01/19 1544  WBC 9.2  --   CREATININE 0.59*  --   LATICACIDVEN  --  1.1    Estimated Creatinine Clearance: 141.3 mL/min (A) (by C-G formula based on SCr of 0.59 mg/dL (L)).    Allergies  Allergen Reactions  . Haloperidol Lactate Other (See Comments)    Per [facility]; nature of intolerance unclear intolerance Other reaction(s): Other (See Comments) Per [facility]; nature of intolerance unclear     Antimicrobials this admission: Cefepime 6/15 >> Vancomycin 6/15 >>  Dose adjustments this admission: None  Microbiology results: 6/15 BCx: pending COVID pending  Thank you for allowing pharmacy to be a part of this patient's care.  Lu Duffel, PharmD, BCPS Clinical Pharmacist 06/01/2019 5:57 PM

## 2019-06-01 NOTE — ED Notes (Signed)
ED TO INPATIENT HANDOFF REPORT  ED Nurse Name and Phone #:  Reuel BoomDaniel [phone]  S Name/Age/Gender Ethan Alvarez 58 y.o. male Room/Bed: ED24A/ED24A  Code Status   Code Status: Full Code  Home/SNF/Other Home Patient oriented to: self and situation Is this baseline? No   Triage Complete: Triage complete  Chief Complaint alt mental status  Triage Note Per EMS patient was noted to have shortness of breath when attending rehab today. EMS also noted that the patient was confused and unable to answer questions en route to the ER. Patient arrived on 3L/min or oxygen.   Allergies Allergies  Allergen Reactions  . Haloperidol Lactate Other (See Comments)    Per [facility]; nature of intolerance unclear intolerance Other reaction(s): Other (See Comments) Per [facility]; nature of intolerance unclear     Level of Care/Admitting Diagnosis ED Disposition    ED Disposition Condition Comment   Admit  [facility] [[id number]]  Level of Care: Med-Surg [16]  Covid Evaluation: N/A  Diagnosis: Cellulitis of right knee [[id number]][[id number]]  Admitting Physician: Milagros LollSUDINI, SRIKAR [[id number]]  Attending Physician: Milagros LollSUDINI, SRIKAR [[id number]]  Estimated length of stay: past midnight tomorrow  Certification:: I certify this patient will need inpatient services for at least 2 midnights  PT Class (Do Not Modify): Inpatient [101]  PT Acc Code (Do Not Modify): Private [1]       B Medical/Surgery History Past Medical History:  Diagnosis Date  . Hypertension    Past Surgical History:  Procedure Laterality Date  . QUADRICEPS TENDON REPAIR Right 04/25/2019   Procedure: REPAIR QUADRICEP TENDON;  Surgeon: Christena FlakePoggi, John J, MD;  Location: [facility] ORS;  Service: Orthopedics;  Laterality: Right;     A IV Location/Drains/Wounds Patient Lines/Drains/Airways Status   Active Line/Drains/Airways    Name:   Placement date:   Placement time:   Site:   Days:   Peripheral IV 06/01/19 Left Hand   06/01/19     1448    Hand   less than 1   Peripheral IV 06/01/19 Right Antecubital   06/01/19    1742    Antecubital   less than 1   External Urinary Catheter   04/26/19    0508    -   36   Incision (Closed) 04/25/19 Knee Right   04/25/19    1554     37          Intake/Output Last 24 hours No intake or output data in the 24 hours ending 06/01/19 2045  Labs/Imaging Results for orders placed or performed during the hospital encounter of 06/01/19 (from the past 48 hour(s))  CBC with Differential     Status: Abnormal   Collection Time: 06/01/19  3:13 PM  Result Value Ref Range   WBC 9.2 4.0 - 10.5 K/uL   RBC 4.31 4.22 - 5.81 MIL/uL   Hemoglobin 13.7 13.0 - 17.0 g/dL   HCT 86.541.1 78.439.0 - 69.652.0 %   MCV 95.4 80.0 - 100.0 fL   MCH 31.8 26.0 - 34.0 pg   MCHC 33.3 30.0 - 36.0 g/dL   RDW 29.513.4 28.411.5 - 13.215.5 %   Platelets 83 (L) 150 - 400 K/uL    Comment: Immature Platelet Fraction may be clinically indicated, consider ordering this additional test [id number]    nRBC 0.0 0.0 - 0.2 %   Neutrophils Relative % 72 %   Neutro Abs 6.6 1.7 - 7.7 K/uL   Lymphocytes Relative 9 %   Lymphs  Abs 0.8 0.7 - 4.0 K/uL   Monocytes Relative 17 %   Monocytes Absolute 1.5 (H) 0.1 - 1.0 K/uL   Eosinophils Relative 1 %   Eosinophils Absolute 0.1 0.0 - 0.5 K/uL   Basophils Relative 0 %   Basophils Absolute 0.0 0.0 - 0.1 K/uL   Smear Review PLATELETS APPEAR DECREASED    Immature Granulocytes 1 %   Abs Immature Granulocytes 0.08 (H) 0.00 - 0.07 K/uL    Comment: Performed at [facility], [address] [place], [place] [address]  Comprehensive metabolic panel     Status: Abnormal   Collection Time: 06/01/19  3:13 PM  Result Value Ref Range   Sodium 137 135 - 145 mmol/L   Potassium 3.1 (L) 3.5 - 5.1 mmol/L   Chloride 110 98 - 111 mmol/L   CO2 19 (L) 22 - 32 mmol/L   Glucose, Bld 95 70 - 99 mg/dL   BUN 9 6 - 20 mg/dL   Creatinine, Ser 6.040.59 (L) 0.61 - 1.24 mg/dL   Calcium 6.0 (LL) 8.9 - 10.3 mg/dL    Comment:  CRITICAL RESULT CALLED TO, READ BACK BY AND VERIFIED WITH Armen [email or web address] 06/01/2019    Total Protein 4.8 (L) 6.5 - 8.1 g/dL   Albumin 2.4 (L) 3.5 - 5.0 g/dL   AST 15 15 - 41 U/L   ALT 13 0 - 44 U/L   Alkaline Phosphatase 39 38 - 126 U/L   Total Bilirubin 0.7 0.3 - 1.2 mg/dL   GFR calc non Af Amer >60 >60 mL/min   GFR calc Af Amer >60 >60 mL/min   Anion gap 8 5 - 15    Comment: Performed at [facility], [address] [place], [place] [address]  Troponin I - ONCE - STAT     Status: None   Collection Time: 06/01/19  3:13 PM  Result Value Ref Range   Troponin I <0.03 <0.03 ng/mL    Comment: Performed at [facility], [address] [place], [place] [address]  Lactic acid, plasma     Status: None   Collection Time: 06/01/19  3:44 PM  Result Value Ref Range   Lactic Acid, Venous 1.1 0.5 - 1.9 mmol/L    Comment: Performed at [facility], [address] [place], [place] [address]   Ct Angio Chest Pe W And/or Wo Contrast  Result Date: 06/01/2019 CLINICAL DATA:  Short of breath EXAM: CT ANGIOGRAPHY CHEST WITH CONTRAST TECHNIQUE: Multidetector CT imaging of the chest was performed using the standard protocol during bolus administration of intravenous contrast. Multiplanar CT image reconstructions and MIPs were obtained to evaluate the vascular anatomy. CONTRAST:  75mL OMNIPAQUE IOHEXOL 350 MG/ML SOLN COMPARISON:  06/01/2019, CT angio chest 04/27/2019 FINDINGS: Cardiovascular: Adequate vascular enhancement. Image quality degraded by moderate motion. Negative for pulmonary embolism. Pulmonary arteries normal in caliber. Normal aortic arch. Heart size normal. No significant coronary calcification. Mediastinum/Nodes: Negative for mass or adenopathy. Lungs/Pleura: Elevated right hemidiaphragm with bibasilar atelectasis. Negative for pneumonia or effusion. Upper Abdomen: Hepatosplenomegaly without mass. Small hiatal hernia. Musculoskeletal: Multiple Schmorl's nodes  throughout the thoracic spine. Review of the MIP images confirms the above findings. IMPRESSION: Negative for pulmonary embolism Bibasilar atelectasis. Hepatosplenomegaly Electronically Signed   By: Marlan Palauharles  Clark M.D.   On: 06/01/2019 18:21   Dg Chest Portable 1 View  Result Date: 06/01/2019 CLINICAL DATA:  Hypertension EXAM: PORTABLE CHEST 1 VIEW COMPARISON:  04/27/2019 FINDINGS: The heart size is mildly enlarged but stable.  There is bibasilar atelectasis. No pneumothorax. No large pleural effusion. No acute osseous abnormality. IMPRESSION: No active disease. Electronically Signed   By: Constance Holster M.D.   On: 06/01/2019 15:45   Dg Knee Complete 4 Views Right  Result Date: 06/01/2019 CLINICAL DATA:  Right knee pain. EXAM: RIGHT KNEE - COMPLETE 4+ VIEW COMPARISON:  04/25/2019 FINDINGS: No evidence of fracture, or dislocation. Postsurgical changes from quadriceps tendon repair. Suprapatellar soft tissue swelling. No evidence of arthropathy . IMPRESSION: 1. No acute fracture or dislocation identified about the right knee. 2. Suprapatellar soft tissue swelling in the region of the quadriceps tendon repair and associated subcutaneous edema. Infectious etiology cannot be excluded. Electronically Signed   By: Fidela Salisbury M.D.   On: 06/01/2019 16:10    Pending Labs Unresulted Labs (From admission, onward)    Start     Ordered   06/08/19 0500  Creatinine, serum  (enoxaparin (LOVENOX)    CrCl >/= 30 ml/min)  Weekly,   STAT    Comments: while on enoxaparin therapy    06/01/19 2031   06/02/19 2956  Basic metabolic panel  Tomorrow morning,   STAT     06/01/19 2031   06/02/19 0500  CBC  Tomorrow morning,   STAT     06/01/19 2031   06/01/19 2028  HIV antibody (Routine Testing)  Add-on,   AD     06/01/19 2031   06/01/19 1747  SARS Coronavirus 2 (CEPHEID- Performed in [place] hospital lab), Hosp Order  (Symptomatic Patients Labs with Precautions )  ONCE - STAT,   STAT     06/01/19 1746    06/01/19 1719  VITAMIN D 25 Hydroxy (Vit-D Deficiency, Fractures)  Add-on,   AD     06/01/19 1718   06/01/19 1719  Calcitriol (1,25 di-OH Vit D)  Add-on,   AD     06/01/19 1718   06/01/19 1718  Parathyroid hormone, intact (no Ca)  Add-on,   AD     06/01/19 1718   06/01/19 1710  Magnesium  Add-on,   AD     06/01/19 1709   06/01/19 1547  Blood culture (routine x 2)  BLOOD CULTURE X 2,   STAT     06/01/19 1726   06/01/19 1537  Blood culture (routine x 2)  BLOOD CULTURE X 2,   STAT,   Status:  Canceled     06/01/19 1537   06/01/19 1537  Sedimentation rate  ONCE - STAT,   STAT     06/01/19 1537   06/01/19 1514  Urinalysis, Complete w Microscopic  Once,   STAT     06/01/19 1513          Vitals/Pain Today's Vitals   06/01/19 1845 06/01/19 1900 06/01/19 1915 06/01/19 1945  BP:  (!) 106/95  (!) 104/93  Pulse: (!) 103 99 99 (!) 101  Resp: (!) 44 (!) 32 18 (!) 37  Temp:      TempSrc:      SpO2: 94% 96% 94% 93%  Weight:      Height:      PainSc:        Isolation Precautions No active isolations  Medications Medications  vancomycin (VANCOCIN) 2,500 mg in sodium chloride 0.9 % 500 mL IVPB (2,500 mg Intravenous New Bag/Given 06/01/19 2006)  ceFEPIme (MAXIPIME) 2 g in sodium chloride 0.9 % 100 mL IVPB (0 g Intravenous Stopped 06/01/19 2000)  vancomycin (VANCOCIN) 1,500 mg in sodium chloride 0.9 % 500 mL  IVPB (has no administration in time range)  enoxaparin (LOVENOX) injection 40 mg (has no administration in time range)  sodium chloride flush (NS) 0.9 % injection 3 mL (has no administration in time range)  acetaminophen (TYLENOL) tablet 650 mg (has no administration in time range)    Or  acetaminophen (TYLENOL) suppository 650 mg (has no administration in time range)  senna (SENOKOT) tablet 8.6 mg (has no administration in time range)  polyethylene glycol (MIRALAX / GLYCOLAX) packet 17 g (has no administration in time range)  ondansetron (ZOFRAN) tablet 4 mg (has no administration in  time range)    Or  ondansetron (ZOFRAN) injection 4 mg (has no administration in time range)  albuterol (PROVENTIL) (2.5 MG/3ML) 0.083% nebulizer solution 2.5 mg (has no administration in time range)  calcium gluconate 1 g/ 50 mL sodium chloride IVPB (0 g Intravenous Stopped 06/01/19 2000)  iohexol (OMNIPAQUE) 350 MG/ML injection 75 mL (75 mLs Intravenous Contrast Given 06/01/19 1756)  potassium chloride SA (K-DUR) CR tablet 40 mEq (40 mEq Oral Given 06/01/19 1959)    Mobility walks with person assist High fall risk   Focused Assessments Cardiac Assessment Handoff:    Lab Results  Component Value Date   CKTOTAL 528 (H) 04/25/2019   TROPONINI <0.03 06/01/2019   No results found for: DDIMER Does the Patient currently have chest pain? No  , Pulmonary Assessment Handoff:  Lung sounds: L Breath Sounds: Clear R Breath Sounds: Clear O2 Device: Nasal Cannula O2 Flow Rate (L/min): 3 L/min      R Recommendations: See Admitting Provider Note  Report given to:   Additional Notes:

## 2019-06-01 NOTE — ED Notes (Signed)
Per Caren Griffins in lab, patient has calcium of 6.0. MD notified.

## 2019-06-01 NOTE — ED Triage Notes (Signed)
Per EMS patient was noted to have shortness of breath when attending rehab today. EMS also noted that the patient was confused and unable to answer questions en route to the ER. Patient arrived on 3L/min or oxygen.

## 2019-06-01 NOTE — H&P (Signed)
[facility] [place] [facility]   PATIENT NAME: Ethan Alvarez    MR#:  [id number]  DATE OF BIRTH:  10/30/1961  DATE OF ADMISSION:  06/01/2019  PRIMARY CARE PHYSICIAN: Mickey Farberhies, David, MD   REQUESTING/REFERRING PHYSICIAN: Dr. Lenard LancePaduchowski  CHIEF COMPLAINT:   Chief Complaint  Patient presents with  . Shortness of Breath  . Altered Mental Status    HISTORY OF PRESENT ILLNESS:  Ethan AduDouglas Daubenspeck  is a 58 y.o. male with a known history of hypertension, schizophrenia, right quadricep tendon rupture and surgery on 04/25/2019 and discharged to skilled nursing facility and then home returns due to right knee pain, redness and swelling.  He also complained of shortness of breath.  Patient had chest x-ray which was clear.  Had a CTA of the chest which showed no PE or mass or infiltrate.  He had similar complaints during his recent admission with extensive work-up was done and shortness of breath was thought to be due to anxiety or atelectasis.  Patient is poor historian.  But there is questionable fall he had landing on his right knee.  Here x-ray of the right knee showed effusion.  PAST MEDICAL HISTORY:   Past Medical History:  Diagnosis Date  . Hypertension     PAST SURGICAL HISTORY:   Past Surgical History:  Procedure Laterality Date  . QUADRICEPS TENDON REPAIR Right 04/25/2019   Procedure: REPAIR QUADRICEP TENDON;  Surgeon: Christena FlakePoggi, John J, MD;  Location: [facility] ORS;  Service: Orthopedics;  Laterality: Right;    SOCIAL HISTORY:   Social History   Tobacco Use  . Smoking status: Never Smoker  . Smokeless tobacco: Never Used  Substance Use Topics  . Alcohol use: Not on file    FAMILY HISTORY:  No family history on file. Unobtainable due to patient being a poor historian DRUG ALLERGIES:   Allergies  Allergen Reactions  . Haloperidol Lactate Other (See Comments)    Per [facility]; nature of intolerance unclear intolerance Other reaction(s): Other (See Comments) Per [facility];  nature of intolerance unclear     REVIEW OF SYSTEMS:   Review of Systems  Unable to perform ROS: Mental status change    MEDICATIONS AT HOME:   Prior to Admission medications   Medication Sig Start Date End Date Taking? Authorizing Provider  acetaminophen (TYLENOL) 325 MG tablet Take 650 mg by mouth every 4 (four) hours as needed for mild pain. 03/21/17  Yes [provider]  benztropine (COGENTIN) 0.5 MG tablet Take 0.5 mg by mouth every morning.    Yes [provider]  benztropine (COGENTIN) 1 MG tablet Take 1 mg by mouth 2 (two) times daily.  03/25/19  Yes [provider]  clonazePAM (KLONOPIN) 0.5 MG tablet Take 0.5 mg by mouth 3 (three) times daily as needed for agitation. 04/08/19  Yes [provider]  divalproex (DEPAKOTE) 500 MG DR tablet Take 1,000 mg by mouth 2 (two) times a day. 02/13/17 07/23/19 Yes [provider]  INVEGA SUSTENNA 234 MG/1.5ML SUSY injection Inject 234 mg into the muscle every 30 (thirty) days. 04/10/19  Yes [provider]  ketoconazole (NIZORAL) 2 % cream Apply 1 application topically. To groin and surrounding areas twice daily until resolved   Yes [provider]  LORazepam (ATIVAN) 0.5 MG tablet Take 0.5 mg by mouth every morning.    Yes [provider]  LORazepam (ATIVAN) 0.5 MG tablet Take 0.5 mg by mouth daily as needed for anxiety.   Yes [provider]  LORazepam (ATIVAN) 1 MG tablet Take 1 mg by mouth 2 (two) times daily.  01/23/19  Yes [provider]  metroNIDAZOLE (METROGEL) 0.75 % gel Apply 1 application topically daily. 04/04/19  Yes [provider]  OLANZapine (ZYPREXA) 15 MG tablet Take 15 mg by mouth daily. 03/25/19 07/23/19 Yes [provider]  oxyCODONE (OXY IR/ROXICODONE) 5 MG immediate release tablet Take 1-2 tablets (5-10 mg total) by mouth every 4 (four) hours as needed for moderate pain. 04/27/19  Yes Lattie Corns, PA-C  propranolol  (INDERAL) 20 MG tablet Take 20 mg by mouth daily.  08/12/18  Yes [provider]  sertraline (ZOLOFT) 100 MG tablet Take 100 mg by mouth 2 (two) times a day. 01/30/18 07/23/19 Yes [provider]  enoxaparin (LOVENOX) 40 MG/0.4ML injection Inject 0.4 mLs (40 mg total) into the skin daily. Patient not taking: Reported on 06/01/2019 04/27/19   Lattie Corns, PA-C     VITAL SIGNS:  Blood pressure (!) 104/93, pulse (!) 101, temperature (!) 97.3 F (36.3 C), temperature source Oral, resp. rate (!) 37, height 5\' 9"  (1.753 m), weight 136.1 kg, SpO2 93 %.  PHYSICAL EXAMINATION:  Physical Exam  GENERAL:  58 y.o.-year-old patient lying in the bed with no acute distress.  EYES: Pupils equal, round, reactive to light and accommodation. No scleral icterus. Extraocular muscles intact.  HEENT: Head atraumatic, normocephalic. Oropharynx and nasopharynx clear. No oropharyngeal erythema, moist oral mucosa. NECK:  Supple, no jugular venous distention. No thyroid enlargement, no tenderness.  LUNGS: Normal breath sounds bilaterally, no wheezing, rales, rhonchi. No use of accessory muscles of respiration.  CARDIOVASCULAR: S1, S2 normal. No murmurs, rubs, or gallops.  ABDOMEN: Soft, nontender, nondistended. Bowel sounds present. No organomegaly or mass.  EXTREMITIES: Right knee swollen and red.  Recent surgical wound with 1 inch of dehiscence.  Clear serous drainage NEUROLOGIC: Cranial nerves II through XII are intact. No focal Motor or sensory deficits Limited assessment and right lower extremity due to pain. PSYCHIATRIC: The patient is alert and oriented x 3 SKIN: No obvious rash, lesion, or ulcer.   LABORATORY PANEL:   CBC Recent Labs  Lab 06/01/19 1513  WBC 9.2  HGB 13.7  HCT 41.1  PLT 83*   ------------------------------------------------------------------------------------------------------------------  Chemistries  Recent Labs  Lab 06/01/19 1513  NA 137  K 3.1*  CL 110   CO2 19*  GLUCOSE 95  BUN 9  CREATININE 0.59*  CALCIUM 6.0*  AST 15  ALT 13  ALKPHOS 39  BILITOT 0.7   ------------------------------------------------------------------------------------------------------------------  Cardiac Enzymes Recent Labs  Lab 06/01/19 1513  TROPONINI <0.03   ------------------------------------------------------------------------------------------------------------------  RADIOLOGY:  Ct Angio Chest Pe W And/or Wo Contrast  Result Date: 06/01/2019 CLINICAL DATA:  Short of breath EXAM: CT ANGIOGRAPHY CHEST WITH CONTRAST TECHNIQUE: Multidetector CT imaging of the chest was performed using the standard protocol during bolus administration of intravenous contrast. Multiplanar CT image reconstructions and MIPs were obtained to evaluate the vascular anatomy. CONTRAST:  55mL OMNIPAQUE IOHEXOL 350 MG/ML SOLN COMPARISON:  06/01/2019, CT angio chest 04/27/2019 FINDINGS: Cardiovascular: Adequate vascular enhancement. Image quality degraded by moderate motion. Negative for pulmonary embolism. Pulmonary arteries normal in caliber. Normal aortic arch. Heart size normal. No significant coronary calcification. Mediastinum/Nodes: Negative for mass or adenopathy. Lungs/Pleura: Elevated right hemidiaphragm with bibasilar atelectasis. Negative for pneumonia or effusion. Upper Abdomen: Hepatosplenomegaly without mass. Small hiatal hernia. Musculoskeletal: Multiple Schmorl's nodes throughout the thoracic spine. Review of the MIP images confirms the above findings.  IMPRESSION: Negative for pulmonary embolism Bibasilar atelectasis. Hepatosplenomegaly Electronically Signed   By: Marlan Palauharles  Clark M.D.   On: 06/01/2019 18:21   Dg Chest Portable 1 View  Result Date: 06/01/2019 CLINICAL DATA:  Hypertension EXAM: PORTABLE CHEST 1 VIEW COMPARISON:  04/27/2019 FINDINGS: The heart size is mildly enlarged but stable. There is bibasilar atelectasis. No pneumothorax. No large pleural effusion. No  acute osseous abnormality. IMPRESSION: No active disease. Electronically Signed   By: Katherine Mantlehristopher  Green M.D.   On: 06/01/2019 15:45   Dg Knee Complete 4 Views Right  Result Date: 06/01/2019 CLINICAL DATA:  Right knee pain. EXAM: RIGHT KNEE - COMPLETE 4+ VIEW COMPARISON:  04/25/2019 FINDINGS: No evidence of fracture, or dislocation. Postsurgical changes from quadriceps tendon repair. Suprapatellar soft tissue swelling. No evidence of arthropathy . IMPRESSION: 1. No acute fracture or dislocation identified about the right knee. 2. Suprapatellar soft tissue swelling in the region of the quadriceps tendon repair and associated subcutaneous edema. Infectious etiology cannot be excluded. Electronically Signed   By: Ted Mcalpineobrinka  Dimitrova M.D.   On: 06/01/2019 16:10     IMPRESSION AND PLAN:   *Right knee cellulitis and concern regarding possible septic arthritis or rerupture of his quadriceps tendon.  Discussed with Dr. Jones BalesKrzeminski.  MRI of the right knee ordered stat.  We will start him on broad-spectrum IV antibiotics.  Culture sent and pending.  *Shortness of breath.  No clear etiology.  Patient's lungs sound clear.  His CT of the chest is clear.  And he has had similar symptoms during recent admission thought to be due to anxiety.  We will start him on his anxiety medications.  *Hypocalcemia.  Corrected calcium is still low.  Will check PTH, vitamin D levels.  Calcium gluconate IV ordered in ED.  Repeat labs in the morning.  *Schizophrenia.  Continue home medications  *Hypertension.  Continue medications  *DVT prophylaxis with Lovenox  All the records are reviewed and case discussed with ED provider. Management plans discussed with the patient, family and they are in agreement.  CODE STATUS: Full code  TOTAL TIME TAKING CARE OF THIS PATIENT: 40 minutes.   Orie FishermanSrikar R Kendle Erker M.D on 06/01/2019 at 8:38 PM  Between 7am to 6pm - Pager - [phone]  After 6pm go to [email or web address] - password [email or web address]  [facility]  [facility] [place] [facility]  Office  [phone]  CC: Primary care physician; Mickey Farberhies, David, MD  Note: This dictation was prepared with Dragon dictation along with smaller phrase technology. Any transcriptional errors that result from this process are unintentional.

## 2019-06-02 ENCOUNTER — Encounter: Payer: Self-pay | Admitting: Anesthesiology

## 2019-06-02 ENCOUNTER — Inpatient Hospital Stay: Payer: Medicare Other | Admitting: Anesthesiology

## 2019-06-02 ENCOUNTER — Encounter
Admission: EM | Disposition: A | Discharge status: Skilled Nursing Facility | Payer: Self-pay | Source: Home / Self Care | Attending: Internal Medicine

## 2019-06-02 ENCOUNTER — Inpatient Hospital Stay: Payer: Medicare Other

## 2019-06-02 HISTORY — PX: INCISION AND DRAINAGE: SHX5863

## 2019-06-02 HISTORY — PX: QUADRICEPS TENDON REPAIR: SHX756

## 2019-06-02 LAB — BASIC METABOLIC PANEL
Anion gap: 9 (ref 5–15)
BUN: 10 mg/dL (ref 6–20)
CO2: 25 mmol/L (ref 22–32)
Calcium: 8.8 mg/dL — ABNORMAL LOW (ref 8.9–10.3)
Chloride: 98 mmol/L (ref 98–111)
Creatinine, Ser: 0.9 mg/dL (ref 0.61–1.24)
GFR calc Af Amer: >60 mL/min (ref >60)
GFR calc non Af Amer: >60 mL/min (ref >60)
Glucose, Bld: 132 mg/dL — ABNORMAL HIGH (ref 70–99)
Potassium: 4.9 mmol/L (ref 3.5–5.1)
Sodium: 132 mmol/L — ABNORMAL LOW (ref 135–145)

## 2019-06-02 LAB — URINALYSIS, COMPLETE (UACMP) WITH MICROSCOPIC
Bacteria, UA: NONE SEEN
Bilirubin Urine: NEGATIVE
Glucose, UA: 50 mg/dL — AB
Hgb urine dipstick: NEGATIVE
Ketones, ur: 5 mg/dL — AB
Leukocytes,Ua: NEGATIVE
Nitrite: NEGATIVE
Protein, ur: 30 mg/dL — AB
Specific Gravity, Urine: 1.027 (ref 1.005–1.030)
pH: 6 (ref 5.0–8.0)

## 2019-06-02 LAB — BLOOD CULTURE ID PANEL (REFLEXED)

## 2019-06-02 LAB — CBC
HCT: 39.6 % (ref 39.0–52.0)
Hemoglobin: 13.2 g/dL (ref 13.0–17.0)
MCH: 31.3 pg (ref 26.0–34.0)
MCHC: 33.3 g/dL (ref 30.0–36.0)
MCV: 93.8 fL (ref 80.0–100.0)
Platelets: 83 10*3/uL — ABNORMAL LOW (ref 150–400)
RBC: 4.22 MIL/uL (ref 4.22–5.81)
RDW: 13.2 % (ref 11.5–15.5)
WBC: 9.2 10*3/uL (ref 4.0–10.5)
nRBC: 0 % (ref 0.0–0.2)

## 2019-06-02 SURGERY — REPAIR, TENDON, QUADRICEPS
Anesthesia: General | Laterality: Right

## 2019-06-02 MED ORDER — FENTANYL CITRATE (PF) 100 MCG/2ML IJ SOLN
INTRAMUSCULAR | Status: DC | PRN
  Administered 2019-06-02 (×4): 50 ug via INTRAVENOUS

## 2019-06-02 MED ORDER — FENTANYL CITRATE (PF) 100 MCG/2ML IJ SOLN
INTRAMUSCULAR | Status: AC
  Filled 2019-06-02: qty 2

## 2019-06-02 MED ORDER — MAGNESIUM HYDROXIDE 400 MG/5ML PO SUSP
30.0000 mL | Freq: Every day | ORAL | Status: DC | PRN
  Filled 2019-06-02: qty 30

## 2019-06-02 MED ORDER — BISACODYL 10 MG RE SUPP: 10.0000 mg | Freq: Every day | RECTAL | Status: DC | PRN

## 2019-06-02 MED ORDER — ONDANSETRON HCL 4 MG/2ML IJ SOLN: 4.0000 mg | Freq: Four times a day (QID) | INTRAMUSCULAR | Status: DC | PRN

## 2019-06-02 MED ORDER — SUGAMMADEX SODIUM 200 MG/2ML IV SOLN
INTRAVENOUS | Status: DC | PRN
  Administered 2019-06-02: 230 mg via INTRAVENOUS

## 2019-06-02 MED ORDER — METOCLOPRAMIDE HCL 10 MG PO TABS: 5.0000 mg | ORAL_TABLET | Freq: Three times a day (TID) | ORAL | Status: DC | PRN

## 2019-06-02 MED ORDER — ACETAMINOPHEN 10 MG/ML IV SOLN
INTRAVENOUS | Status: DC | PRN
  Administered 2019-06-02: 1000 mg via INTRAVENOUS

## 2019-06-02 MED ORDER — LIDOCAINE HCL (CARDIAC) PF 100 MG/5ML IV SOSY
PREFILLED_SYRINGE | INTRAVENOUS | Status: DC | PRN
  Administered 2019-06-02: 60 mg via INTRAVENOUS

## 2019-06-02 MED ORDER — HYDROMORPHONE HCL 1 MG/ML IJ SOLN: 0.5000 mg | INTRAMUSCULAR | Status: DC | PRN

## 2019-06-02 MED ORDER — ACETAMINOPHEN 10 MG/ML IV SOLN
INTRAVENOUS | Status: AC
  Filled 2019-06-02: qty 100

## 2019-06-02 MED ORDER — KETOROLAC TROMETHAMINE 15 MG/ML IJ SOLN
15.0000 mg | Freq: Four times a day (QID) | INTRAMUSCULAR | Status: AC
  Administered 2019-06-02 – 2019-06-03 (×3): 15 mg via INTRAVENOUS
  Filled 2019-06-02 (×3): qty 1

## 2019-06-02 MED ORDER — ROCURONIUM BROMIDE 100 MG/10ML IV SOLN
INTRAVENOUS | Status: DC | PRN
  Administered 2019-06-02: 20 mg via INTRAVENOUS
  Administered 2019-06-02: 10 mg via INTRAVENOUS

## 2019-06-02 MED ORDER — ONDANSETRON HCL 4 MG PO TABS: 4.0000 mg | ORAL_TABLET | Freq: Four times a day (QID) | ORAL | Status: DC | PRN

## 2019-06-02 MED ORDER — VANCOMYCIN HCL IN DEXTROSE 1-5 GM/200ML-% IV SOLN
1000.0000 mg | Freq: Two times a day (BID) | INTRAVENOUS | Status: DC
  Administered 2019-06-02 – 2019-06-03 (×2): 1000 mg via INTRAVENOUS
  Filled 2019-06-02 (×5): qty 200

## 2019-06-02 MED ORDER — SUCCINYLCHOLINE CHLORIDE 20 MG/ML IJ SOLN
INTRAMUSCULAR | Status: DC | PRN
  Administered 2019-06-02: 120 mg via INTRAVENOUS

## 2019-06-02 MED ORDER — TRAMADOL HCL 50 MG PO TABS: 50.0000 mg | ORAL_TABLET | Freq: Four times a day (QID) | ORAL | Status: DC | PRN

## 2019-06-02 MED ORDER — SUGAMMADEX SODIUM 200 MG/2ML IV SOLN
INTRAVENOUS | Status: AC
  Filled 2019-06-02: qty 2

## 2019-06-02 MED ORDER — ACETAMINOPHEN 500 MG PO TABS
1000.0000 mg | ORAL_TABLET | Freq: Four times a day (QID) | ORAL | Status: AC
  Administered 2019-06-03: 1000 mg via ORAL
  Filled 2019-06-02 (×2): qty 2

## 2019-06-02 MED ORDER — DEXAMETHASONE SODIUM PHOSPHATE 10 MG/ML IJ SOLN
INTRAMUSCULAR | Status: AC
  Filled 2019-06-02: qty 1

## 2019-06-02 MED ORDER — DEXAMETHASONE SODIUM PHOSPHATE 10 MG/ML IJ SOLN
INTRAMUSCULAR | Status: DC | PRN
  Administered 2019-06-02: 5 mg via INTRAVENOUS

## 2019-06-02 MED ORDER — ENOXAPARIN SODIUM 40 MG/0.4ML ~~LOC~~ SOLN
40.0000 mg | SUBCUTANEOUS | Status: DC
  Administered 2019-06-03 – 2019-06-09 (×7): 40 mg via SUBCUTANEOUS
  Filled 2019-06-02 (×7): qty 0.4

## 2019-06-02 MED ORDER — OXYCODONE HCL 5 MG PO TABS: 5.0000 mg | ORAL_TABLET | ORAL | Status: DC | PRN

## 2019-06-02 MED ORDER — ONDANSETRON HCL 4 MG/2ML IJ SOLN
INTRAMUSCULAR | Status: DC | PRN
  Administered 2019-06-02: 4 mg via INTRAVENOUS

## 2019-06-02 MED ORDER — BUPIVACAINE HCL (PF) 0.5 % IJ SOLN
INTRAMUSCULAR | Status: AC
  Filled 2019-06-02: qty 30

## 2019-06-02 MED ORDER — DIPHENHYDRAMINE HCL 12.5 MG/5ML PO ELIX: 12.5000 mg | ORAL_SOLUTION | ORAL | Status: DC | PRN

## 2019-06-02 MED ORDER — LIDOCAINE HCL (PF) 2 % IJ SOLN
INTRAMUSCULAR | Status: AC
  Filled 2019-06-02: qty 10

## 2019-06-02 MED ORDER — MIDAZOLAM HCL 5 MG/5ML IJ SOLN
INTRAMUSCULAR | Status: DC | PRN
  Administered 2019-06-02: 2 mg via INTRAVENOUS

## 2019-06-02 MED ORDER — METOCLOPRAMIDE HCL 5 MG/ML IJ SOLN: 5.0000 mg | Freq: Three times a day (TID) | INTRAMUSCULAR | Status: DC | PRN

## 2019-06-02 MED ORDER — SODIUM CHLORIDE 0.9 % IV SOLN
INTRAVENOUS | Status: DC | PRN
  Administered 2019-06-02 (×2): via INTRAVENOUS

## 2019-06-02 MED ORDER — SODIUM CHLORIDE 0.9 % IV SOLN
INTRAVENOUS | Status: DC
  Administered 2019-06-02 – 2019-06-05 (×4): via INTRAVENOUS

## 2019-06-02 MED ORDER — DOCUSATE SODIUM 100 MG PO CAPS
100.0000 mg | ORAL_CAPSULE | Freq: Two times a day (BID) | ORAL | Status: DC
  Administered 2019-06-02 – 2019-06-09 (×14): 100 mg via ORAL
  Filled 2019-06-02 (×14): qty 1

## 2019-06-02 MED ORDER — FLEET ENEMA 7-19 GM/118ML RE ENEM: 1.0000 | ENEMA | Freq: Once | RECTAL | Status: DC | PRN

## 2019-06-02 MED ORDER — ACETAMINOPHEN 325 MG PO TABS: 325.0000 mg | ORAL_TABLET | Freq: Four times a day (QID) | ORAL | Status: DC | PRN

## 2019-06-02 MED ORDER — ROCURONIUM BROMIDE 50 MG/5ML IV SOLN
INTRAVENOUS | Status: AC
  Filled 2019-06-02: qty 1

## 2019-06-02 MED ORDER — NEOMYCIN-POLYMYXIN B GU 40-200000 IR SOLN
Status: DC | PRN
  Administered 2019-06-02: 12 mL
  Administered 2019-06-02: 4 mL

## 2019-06-02 MED ORDER — MIDAZOLAM HCL 2 MG/2ML IJ SOLN
INTRAMUSCULAR | Status: AC
  Filled 2019-06-02: qty 2

## 2019-06-02 MED ORDER — PROPOFOL 10 MG/ML IV BOLUS
INTRAVENOUS | Status: AC
  Filled 2019-06-02: qty 20

## 2019-06-02 MED ORDER — PROPOFOL 10 MG/ML IV BOLUS
INTRAVENOUS | Status: DC | PRN
  Administered 2019-06-02: 180 mg via INTRAVENOUS

## 2019-06-02 MED ORDER — SEVOFLURANE IN SOLN
RESPIRATORY_TRACT | Status: AC
  Filled 2019-06-02: qty 250

## 2019-06-02 MED ORDER — ONDANSETRON HCL 4 MG/2ML IJ SOLN
INTRAMUSCULAR | Status: AC
  Filled 2019-06-02: qty 2

## 2019-06-02 MED ORDER — SUCCINYLCHOLINE CHLORIDE 20 MG/ML IJ SOLN
INTRAMUSCULAR | Status: AC
  Filled 2019-06-02: qty 1

## 2019-06-02 SURGICAL SUPPLY — 71 items
ANCHOR SUT 5.0 TWINFIX 2 ULBRD (Anchor) ×2 IMPLANT
BANDAGE ACE 6X5 VEL STRL LF (GAUZE/BANDAGES/DRESSINGS) ×2 IMPLANT
BLADE SURG SZ10 CARB STEEL (BLADE) ×6 IMPLANT
BNDG COHESIVE 4X5 TAN STRL (GAUZE/BANDAGES/DRESSINGS) ×5 IMPLANT
BNDG ESMARK 4X12 TAN STRL LF (GAUZE/BANDAGES/DRESSINGS) ×3 IMPLANT
BNDG ESMARK 6X12 TAN STRL LF (GAUZE/BANDAGES/DRESSINGS) ×3 IMPLANT
CANISTER SUCT 1200ML W/VALVE (MISCELLANEOUS) ×3 IMPLANT
CHLORAPREP W/TINT 26 (MISCELLANEOUS) ×3 IMPLANT
CORD BIP STRL DISP 12FT (MISCELLANEOUS) ×1 IMPLANT
COVER WAND RF STERILE (DRAPES) ×3 IMPLANT
CUFF TOURN 24 STER (MISCELLANEOUS) IMPLANT
CUFF TOURN 30 STER DUAL PORT (MISCELLANEOUS) ×2 IMPLANT
CUFF TOURN SGL QUICK 18X4 (TOURNIQUET CUFF) IMPLANT
CUFF TOURN SGL QUICK 24 (TOURNIQUET CUFF)
CUFF TRNQT CYL 24X4X16.5-23 (TOURNIQUET CUFF) IMPLANT
DRAPE IMP U-DRAPE 54X76 (DRAPES) ×6 IMPLANT
DRAPE INCISE IOBAN 66X45 STRL (DRAPES) ×1 IMPLANT
DRAPE SHEET LG 3/4 BI-LAMINATE (DRAPES) ×3 IMPLANT
DRAPE SURG 17X11 SM STRL (DRAPES) ×2 IMPLANT
DRAPE U-SHAPE 47X51 STRL (DRAPES) ×2 IMPLANT
ELECT REM PT RETURN 9FT ADLT (ELECTROSURGICAL) ×3
ELECTRODE REM PT RTRN 9FT ADLT (ELECTROSURGICAL) ×1 IMPLANT
FORCEPS JEWEL BIP 4-3/4 STR (INSTRUMENTS) ×1 IMPLANT
GAUZE SPONGE 4X4 12PLY STRL (GAUZE/BANDAGES/DRESSINGS) ×5 IMPLANT
GAUZE XEROFORM 1X8 LF (GAUZE/BANDAGES/DRESSINGS) ×5 IMPLANT
GLOVE BIO SURGEON STRL SZ7 (GLOVE) ×2 IMPLANT
GLOVE BIO SURGEON STRL SZ8 (GLOVE) ×10 IMPLANT
GLOVE INDICATOR 8.0 STRL GRN (GLOVE) ×7 IMPLANT
GOWN STRL REUS W/ TWL LRG LVL3 (GOWN DISPOSABLE) ×2 IMPLANT
GOWN STRL REUS W/ TWL XL LVL3 (GOWN DISPOSABLE) ×1 IMPLANT
GOWN STRL REUS W/TWL LRG LVL3 (GOWN DISPOSABLE) ×6
GOWN STRL REUS W/TWL XL LVL3 (GOWN DISPOSABLE) ×2
HANDLE YANKAUER SUCT BULB TIP (MISCELLANEOUS) ×3 IMPLANT
HEMOVAC LRG 3/16 (MISCELLANEOUS) ×2 IMPLANT
IMMBOLIZER KNEE 19 BLUE UNIV (SOFTGOODS) ×1 IMPLANT
KIT TURNOVER KIT A (KITS) ×3 IMPLANT
NDL MAYO 6 CRC TAPER PT (NEEDLE) IMPLANT
NDL MAYO CATGUT SZ1 (NEEDLE) ×3 IMPLANT
NEEDLE MAYO 6 CRC TAPER PT (NEEDLE) ×3 IMPLANT
NEEDLE MAYO CATGUT SZ1 (NEEDLE) ×1 IMPLANT
NS IRRIG 1000ML POUR BTL (IV SOLUTION) ×3 IMPLANT
NS IRRIG 500ML POUR BTL (IV SOLUTION) ×1 IMPLANT
PACK EXTREMITY ARMC (MISCELLANEOUS) ×3 IMPLANT
PAD ABD DERMACEA PRESS 5X9 (GAUZE/BANDAGES/DRESSINGS) ×4 IMPLANT
PAD CAST CTTN 4X4 STRL (SOFTGOODS) ×1 IMPLANT
PADDING CAST COTTON 4X4 STRL (SOFTGOODS)
PULSAVAC PLUS IRRIG FAN TIP (DISPOSABLE) ×3
RETRIEVER SUT HEWSON (MISCELLANEOUS) ×5 IMPLANT
SPONGE LAP 18X18 RF (DISPOSABLE) ×3 IMPLANT
STAPLER SKIN PROX 35W (STAPLE) ×3 IMPLANT
STOCKINETTE 48X4 2 PLY STRL (GAUZE/BANDAGES/DRESSINGS) ×1 IMPLANT
STOCKINETTE IMPERVIOUS 9X36 MD (GAUZE/BANDAGES/DRESSINGS) ×1 IMPLANT
STOCKINETTE M/LG 89821 (MISCELLANEOUS) ×2 IMPLANT
STOCKINETTE STRL 4IN 9604848 (GAUZE/BANDAGES/DRESSINGS) ×3 IMPLANT
STRAP SAFETY 5IN WIDE (MISCELLANEOUS) ×3 IMPLANT
SUT BONE WAX W31G (SUTURE) ×2 IMPLANT
SUT ETHIBOND #5 BRAIDED 30INL (SUTURE) ×5 IMPLANT
SUT FIBERWIRE #2 38 BLUE 1/2 (SUTURE)
SUT FIBERWIRE #5 38 BLUE (WIRE) ×6 IMPLANT
SUT FIBERWIRE #5 38 CONV BLUE (SUTURE) ×3
SUT PROLENE 4 0 PS 2 18 (SUTURE) ×1 IMPLANT
SUT VIC AB 0 CT1 36 (SUTURE) ×6 IMPLANT
SUT VIC AB 2-0 CT1 27 (SUTURE) ×4
SUT VIC AB 2-0 CT1 TAPERPNT 27 (SUTURE) ×2 IMPLANT
SUT VIC AB 2-0 CT2 27 (SUTURE) ×6 IMPLANT
SUT VIC AB 3-0 SH 27 (SUTURE)
SUT VIC AB 3-0 SH 27X BRD (SUTURE) ×2 IMPLANT
SUTURE FIBERWR #2 38 BLUE 1/2 (SUTURE) IMPLANT
SUTURE FIBERWR #5 38 CONV BLUE (SUTURE) IMPLANT
TIP FAN IRRIG PULSAVAC PLUS (DISPOSABLE) IMPLANT
TOWEL OR 17X26 4PK STRL BLUE (TOWEL DISPOSABLE) ×2 IMPLANT

## 2019-06-02 NOTE — H&P (Signed)
The results of the recent MRI scan of this patient's right knee show that he has reruptured his right quadriceps tendon repair.  In addition, there is a large effusion and evidence to suggest a possible infection.  Therefore, the patient will be brought to the operating room for a formal irrigation and debridement of the knee with an attempted repair of the recurrent right quadriceps tendon rupture.  This procedure has been discussed in detail with the patient's sister, who is the patient's healthcare proxy, as have the potential risks (including bleeding, infection, nerve and/or blood vessel injury, persistent recurrent pain, stiffness, weakness, failure of the repair, need for further surgery, blood clots, strokes, heart attacks and arrhythmias, etc) and benefits.  She states her understanding and, on behalf of the patient, gives her verbal consent to proceed with this operation.  Given the concern for infection in the joint, I feel that this surgery is to be considered an emergent procedure.

## 2019-06-02 NOTE — Op Note (Signed)
06/02/2019  5:51 PM  Patient:   Ethan Alvarez  Pre-Op Diagnosis:   Deep infection status post primary repair of right quadriceps tendon rupture with recurrent right quadriceps tendon rupture.  Post-Op Diagnosis:   Same  Procedure:   Irrigation and debridement with primary repair of recurrent right quadriceps tendon rupture.  Surgeon:   Pascal Lux, MD  Assistant:   Benjaman Lobe, RNFA  Anesthesia:   GET  Findings:   As above.  Complications:   None  Fluids:   550 cc crystalloid  EBL:   25 cc  UOP:   None  TT:   105 minutes at 300 mmHg  Drains:   Large Hemovac x1  Closure:   Staples  Implants:   None  Brief Clinical Note:   The patient is a 58 year old male who is now 5+ week status post a primary repair of a right quadriceps tendon rupture.  The patient presented emergency room last night with a several day history of increased pain and swelling of his knee.  He was also confused and was noted to have a fever in the emergency room of 101.9.  He was admitted for sepsis and started on IV antibiotics.  An MRI scan this morning confirmed the presence of a large effusion as well as demonstrating a recurrent rupture/failure of repair of the quadriceps tendon.  The patient presents at this time for formal irrigation and debridement with re-repair of the right quadriceps tendon rupture.  Procedure:   The patient was brought into the operating room and lain in the supine position.  After adequate general endotracheal intubation anesthesia were obtained, the patient's right lower extremity was prepped with ChloraPrep solution before being draped sterilely.  Preoperative antibiotics were administered.  The previous incision was reopened and carried down through the subcutaneous tissues to expose the superficial retinaculum.  As the medial and lateral flaps were elevated, the recurrent quadriceps tendon rupture was apparent.  There also was obvious purulent material extending into the  joint.  This fluid was cultured.  The exposed suture material was debrided/removed using rongeurs before the joint was debrided thoroughly of fibrinous and purulent material.  A substantial synovectomy was performed as well.  The three metallic anchors placed previously were removed from the superior pole of the patella.  It did appear that the lateral anchor site did have infection around it as the bone was soft.  The anchor sites were curetted before the knee was irrigated thoroughly with 3 L of bacitracin-saline solution using the jet lavage system.  The degenerative and avascular margins of the quadriceps tendon margin were debrided back to clean margins using a #10 blade.  Utilizing the three previous anchor holes, a 2.5 mm drill was used to make a tunnel through the patella and at the inferior pole.  Four #5 FiberWire's were passed up through the central tunnel.  Two of the ends were patent passed through the medial tunnel and the other two were passed through the lateral tunnel.  Each of the sutures were then woven in a modified Bennell fashion through the quadriceps tendon while maintaining the knee in extension before tying them securely.  The sutures were then rerouted back across the anterior face of the patella and sutured into the patellar tendon distally to reinforce the repair.  The repair appears stable with knee flexion to 45 degrees.  A single limb of the large Hemovac drain was placed before the wound again was irrigated thoroughly with bacitracin saline  solution using bulb irrigation.  The deeper subcutaneous tissues were closed using #0 Vicryl interrupted sutures before the superficial subcutaneous tissues were closed using 2-0 Vicryl interrupted sutures.  The skin was closed using staples.  A sterile bulky dressing was applied to the knee before the patient was placed into a knee immobilizer maintaining the knee at full extension.  The patient was then awakened, extubated, and returned to  the recovery room in satisfactory condition after tolerating the procedure well.

## 2019-06-02 NOTE — Progress Notes (Signed)
Patient off the floor for surgery.

## 2019-06-02 NOTE — Anesthesia Post-op Follow-up Note (Signed)
Anesthesia QCDR form completed.        

## 2019-06-02 NOTE — Progress Notes (Signed)
PHARMACY - PHYSICIAN COMMUNICATION CRITICAL VALUE ALERT - BLOOD CULTURE IDENTIFICATION (BCID)  Ethan Alvarez is an 58 y.o. male who presented to [facility] on 06/01/2019 with a chief complaint of SOB/altered mental status  Assessment:  1/4 bottles anaerobic positive for Staph Species(S. Aureus), MecA not detected    Name of physician (or Provider) Contacted: Dr. Posey Pronto  Current antibiotics: Cefepime, Vancomycin  Changes to prescribed antibiotics recommended:  Recommendations accepted by provider. Will discontinue Vancomycin and continue Cefepime 2g Q8H  Results for orders placed or performed during the hospital encounter of 06/01/19  Blood Culture ID Panel (Reflexed) (Collected: 06/01/2019  3:45 PM)  Result Value Ref Range   Enterococcus species NOT DETECTED NOT DETECTED   Listeria monocytogenes NOT DETECTED NOT DETECTED   Staphylococcus species DETECTED (A) NOT DETECTED   Staphylococcus aureus (BCID) DETECTED (A) NOT DETECTED   Methicillin resistance NOT DETECTED NOT DETECTED   Streptococcus species NOT DETECTED NOT DETECTED   Streptococcus agalactiae NOT DETECTED NOT DETECTED   Streptococcus pneumoniae NOT DETECTED NOT DETECTED   Streptococcus pyogenes NOT DETECTED NOT DETECTED   Acinetobacter baumannii NOT DETECTED NOT DETECTED   Enterobacteriaceae species NOT DETECTED NOT DETECTED   Enterobacter cloacae complex NOT DETECTED NOT DETECTED   Escherichia coli NOT DETECTED NOT DETECTED   Klebsiella oxytoca NOT DETECTED NOT DETECTED   Klebsiella pneumoniae NOT DETECTED NOT DETECTED   Proteus species NOT DETECTED NOT DETECTED   Serratia marcescens NOT DETECTED NOT DETECTED   Haemophilus influenzae NOT DETECTED NOT DETECTED   Neisseria meningitidis NOT DETECTED NOT DETECTED   Pseudomonas aeruginosa NOT DETECTED NOT DETECTED   Candida albicans NOT DETECTED NOT DETECTED   Candida glabrata NOT DETECTED NOT DETECTED   Candida krusei NOT DETECTED NOT DETECTED   Candida parapsilosis NOT  DETECTED NOT DETECTED   Candida tropicalis NOT DETECTED NOT DETECTED    [name] 06/02/2019  10:32 AM

## 2019-06-02 NOTE — Progress Notes (Signed)
[facility] - [place] at [facility]                                                                                                                                                                                  Patient Demographics   Gaynelle AduDouglas Jelinski, is a 58 y.o. male, DOB - 01/11/1961, BJY:[id number]  Admit date - 06/01/2019   Admitting Physician Milagros LollSrikar Sudini, MD  Outpatient Primary MD for the patient is Mickey Farberhies, David, MD   LOS - 1  Subjective: Patient states that he is doing fine denies any complaints    Review of Systems:   CONSTITUTIONAL: No documented fever. No fatigue, weakness. No weight gain, no weight loss.  EYES: No blurry or double vision.  ENT: No tinnitus. No postnasal drip. No redness of the oropharynx.  RESPIRATORY: No cough, no wheeze, no hemoptysis. No dyspnea.  CARDIOVASCULAR: No chest pain. No orthopnea. No palpitations. No syncope.  GASTROINTESTINAL: No nausea, no vomiting or diarrhea. No abdominal pain. No melena or hematochezia.  GENITOURINARY: No dysuria or hematuria.  ENDOCRINE: No polyuria or nocturia. No heat or cold intolerance.  HEMATOLOGY: No anemia. No bruising. No bleeding.  INTEGUMENTARY: No rashes. No lesions.  MUSCULOSKELETAL: No arthritis. No swelling. No gout.  NEUROLOGIC: No numbness, tingling, or ataxia. No seizure-type activity.  PSYCHIATRIC: No anxiety. No insomnia. No ADD.    Vitals:   Vitals:   06/02/19 0318 06/02/19 0500 06/02/19 0535 06/02/19 0802  BP: (!) 118/57  114/61 122/68  Pulse: 82  77 85  Resp: (!) 36  (!) 28 (!) 26  Temp: 99.5 F (37.5 C)  99 F (37.2 C) 98 F (36.7 C)  TempSrc: Oral  Oral Oral  SpO2: 96%  96% 98%  Weight:  116.7 kg    Height:        Wt Readings from Last 3 Encounters:  06/02/19 116.7 kg  04/26/19 130.4 kg     Intake/Output Summary (Last 24 hours) at 06/02/2019 1201 Last data filed at 06/02/2019 0940 Gross per 24 hour  Intake 692.24 ml  Output -  Net 692.24 ml    Physical  Exam:   GENERAL: Pleasant-appearing in no apparent distress.  HEAD, EYES, EARS, NOSE AND THROAT: Atraumatic, normocephalic. Extraocular muscles are intact. Pupils equal and reactive to light. Sclerae anicteric. No conjunctival injection. No oro-pharyngeal erythema.  NECK: Supple. There is no jugular venous distention. No bruits, no lymphadenopathy, no thyromegaly.  HEART: Regular rate and rhythm,. No murmurs, no rubs, no clicks.  LUNGS: Clear to auscultation bilaterally. No rales or rhonchi. No wheezes.  ABDOMEN: Soft, flat, nontender, nondistended. Has good bowel sounds. No hepatosplenomegaly appreciated.  EXTREMITIES: No evidence of  any cyanosis, clubbing, or peripheral edema.  +2 pedal and radial pulses bilaterally.  Has a brace placed on the right knee by orthopedics earlier today NEUROLOGIC: The patient is alert, awake, and oriented x3 with no focal motor or sensory deficits appreciated bilaterally.  SKIN: Moist and warm with no rashes appreciated.  Psych: Not anxious, depressed LN: No inguinal LN enlargement    Antibiotics   Anti-infectives (From admission, onward)   Start     Dose/Rate Route Frequency Ordered Stop   06/02/19 1000  vancomycin (VANCOCIN) 1,500 mg in sodium chloride 0.9 % 500 mL IVPB  Status:  Discontinued     1,500 mg 250 mL/hr over 120 Minutes Intravenous Every 12 hours 06/01/19 1758 06/01/19 2328   06/02/19 1000  vancomycin (VANCOCIN) 1,500 mg in sodium chloride 0.9 % 500 mL IVPB  Status:  Discontinued     1,500 mg 250 mL/hr over 120 Minutes Intravenous Every 12 hours 06/01/19 2328 06/02/19 1032   06/01/19 1800  vancomycin (VANCOCIN) 2,500 mg in sodium chloride 0.9 % 500 mL IVPB     2,500 mg 250 mL/hr over 120 Minutes Intravenous  Once 06/01/19 1753 06/01/19 2227   06/01/19 1800  ceFEPIme (MAXIPIME) 2 g in sodium chloride 0.9 % 100 mL IVPB     2 g 200 mL/hr over 30 Minutes Intravenous Every 8 hours 06/01/19 1754        Medications   Scheduled Meds: .  benztropine  0.5 mg Oral BH-q7a  . benztropine  1 mg Oral BID  . divalproex  1,000 mg Oral BID  . enoxaparin (LOVENOX) injection  40 mg Subcutaneous Q24H  . LORazepam  1 mg Oral BID  . OLANZapine  15 mg Oral Daily  . propranolol  20 mg Oral Daily  . senna  1 tablet Oral BID  . sertraline  100 mg Oral BID  . sodium chloride flush  3 mL Intravenous Q12H   Continuous Infusions: . ceFEPime (MAXIPIME) IV 2 g (06/02/19 0552)   PRN Meds:.acetaminophen **OR** acetaminophen, clonazePAM, ipratropium-albuterol, ondansetron **OR** ondansetron (ZOFRAN) IV, oxyCODONE, polyethylene glycol   Data Review:   Micro Results Recent Results (from the past 240 hour(s))  Blood culture (routine x 2)     Status: None (Preliminary result)   Collection Time: 06/01/19  3:45 PM   Specimen: BLOOD  Result Value Ref Range Status   Specimen Description BLOOD LEFT ANTECUBITAL  Final   Special Requests   Final    BOTTLES DRAWN AEROBIC AND ANAEROBIC Blood Culture results may not be optimal due to an excessive volume of blood received in culture bottles   Culture  Setup Time   Final    Organism ID to follow GRAM POSITIVE COCCI ANAEROBIC BOTTLE ONLY CRITICAL RESULT CALLED TO, READ BACK BY AND VERIFIED WITH: Crist FatHANNAH WANG AT 1000 06/02/2019 SDR Performed at [facility], [address] [place], [place] [address]    Culture GRAM POSITIVE COCCI  Final   Report Status PENDING  Incomplete  Blood Culture ID Panel (Reflexed)     Status: Abnormal   Collection Time: 06/01/19  3:45 PM  Result Value Ref Range Status   Enterococcus species NOT DETECTED NOT DETECTED Final   Listeria monocytogenes NOT DETECTED NOT DETECTED Final   Staphylococcus species DETECTED (A) NOT DETECTED Final    Comment: CRITICAL RESULT CALLED TO, READ BACK BY AND VERIFIED WITH:  Crist FatHANNAH WANG AT 1000 06/02/2019 SDR    Staphylococcus aureus (BCID) DETECTED (A) NOT DETECTED Final  Comment: Methicillin (oxacillin) susceptible Staphylococcus  aureus (MSSA). Preferred therapy is anti staphylococcal beta lactam antibiotic (Cefazolin or Nafcillin), unless clinically contraindicated. CRITICAL RESULT CALLED TO, READ BACK BY AND VERIFIED WITH:  Crist Fat AT 1000 06/02/2019 SDR    Methicillin resistance NOT DETECTED NOT DETECTED Final   Streptococcus species NOT DETECTED NOT DETECTED Final   Streptococcus agalactiae NOT DETECTED NOT DETECTED Final   Streptococcus pneumoniae NOT DETECTED NOT DETECTED Final   Streptococcus pyogenes NOT DETECTED NOT DETECTED Final   Acinetobacter baumannii NOT DETECTED NOT DETECTED Final   Enterobacteriaceae species NOT DETECTED NOT DETECTED Final   Enterobacter cloacae complex NOT DETECTED NOT DETECTED Final   Escherichia coli NOT DETECTED NOT DETECTED Final   Klebsiella oxytoca NOT DETECTED NOT DETECTED Final   Klebsiella pneumoniae NOT DETECTED NOT DETECTED Final   Proteus species NOT DETECTED NOT DETECTED Final   Serratia marcescens NOT DETECTED NOT DETECTED Final   Haemophilus influenzae NOT DETECTED NOT DETECTED Final   Neisseria meningitidis NOT DETECTED NOT DETECTED Final   Pseudomonas aeruginosa NOT DETECTED NOT DETECTED Final   Candida albicans NOT DETECTED NOT DETECTED Final   Candida glabrata NOT DETECTED NOT DETECTED Final   Candida krusei NOT DETECTED NOT DETECTED Final   Candida parapsilosis NOT DETECTED NOT DETECTED Final   Candida tropicalis NOT DETECTED NOT DETECTED Final    Comment: Performed at [facility], [address], [place], [place] [address]  SARS Coronavirus 2 (CEPHEID- Performed in [facility] hospital lab), Hosp Order     Status: None   Collection Time: 06/01/19  5:47 PM   Specimen: Nasopharyngeal Swab  Result Value Ref Range Status   SARS Coronavirus 2 NEGATIVE NEGATIVE Final    Comment: (NOTE) If result is NEGATIVE SARS-CoV-2 target nucleic acids are NOT DETECTED. The SARS-CoV-2 RNA is generally detectable in upper and lower  respiratory specimens  during the acute phase of infection. The lowest  concentration of SARS-CoV-2 viral copies this assay can detect is 250  copies / mL. A negative result does not preclude SARS-CoV-2 infection  and should not be used as the sole basis for treatment or other  patient management decisions.  A negative result may occur with  improper specimen collection / handling, submission of specimen other  than nasopharyngeal swab, presence of viral mutation(s) within the  areas targeted by this assay, and inadequate number of viral copies  (<250 copies / mL). A negative result must be combined with clinical  observations, patient history, and epidemiological information. If result is POSITIVE SARS-CoV-2 target nucleic acids are DETECTED. The SARS-CoV-2 RNA is generally detectable in upper and lower  respiratory specimens dur ing the acute phase of infection.  Positive  results are indicative of active infection with SARS-CoV-2.  Clinical  correlation with patient history and other diagnostic information is  necessary to determine patient infection status.  Positive results do  not rule out bacterial infection or co-infection with other viruses. If result is PRESUMPTIVE POSTIVE SARS-CoV-2 nucleic acids MAY BE PRESENT.   A presumptive positive result was obtained on the submitted specimen  and confirmed on repeat testing.  While 2019 novel coronavirus  (SARS-CoV-2) nucleic acids may be present in the submitted sample  additional confirmatory testing may be necessary for epidemiological  and / or clinical management purposes  to differentiate between  SARS-CoV-2 and other Sarbecovirus currently known to infect humans.  If clinically indicated additional testing with an alternate test  methodology [phone]) is advised. The SARS-CoV-2 RNA is  generally  detectable in upper and lower respiratory sp ecimens during the acute  phase of infection. The expected result is Negative. Fact Sheet for Patients:   BoilerBrush.com.cy Fact Sheet for Healthcare Providers: https://pope.com/ This test is not yet approved or cleared by the [place] FDA and has been authorized for detection and/or diagnosis of SARS-CoV-2 by FDA under an Emergency Use Authorization (EUA).  This EUA will remain in effect (meaning this test can be used) for the duration of the COVID-19 declaration under Section 564(b)(1) of the Act, 21 U.S.C. section 360bbb-3(b)(1), unless the authorization is terminated or revoked sooner. Performed at [facility], [address] [place], [place] [address]   Blood culture (routine x 2)     Status: None (Preliminary result)   Collection Time: 06/01/19 10:17 PM   Specimen: BLOOD  Result Value Ref Range Status   Specimen Description BLOOD BLOOD RIGHT HAND  Final   Special Requests   Final    BOTTLES DRAWN AEROBIC AND ANAEROBIC Blood Culture adequate volume   Culture   Final    NO GROWTH < 12 HOURS Performed at [facility], [address] [place], [place] [address]    Report Status PENDING  Incomplete    Radiology Reports Ct Angio Chest Pe W And/or Wo Contrast  Result Date: 06/01/2019 CLINICAL DATA:  Short of breath EXAM: CT ANGIOGRAPHY CHEST WITH CONTRAST TECHNIQUE: Multidetector CT imaging of the chest was performed using the standard protocol during bolus administration of intravenous contrast. Multiplanar CT image reconstructions and MIPs were obtained to evaluate the vascular anatomy. CONTRAST:  75mL OMNIPAQUE IOHEXOL 350 MG/ML SOLN COMPARISON:  06/01/2019, CT angio chest 04/27/2019 FINDINGS: Cardiovascular: Adequate vascular enhancement. Image quality degraded by moderate motion. Negative for pulmonary embolism. Pulmonary arteries normal in caliber. Normal aortic arch. Heart size normal. No significant coronary calcification. Mediastinum/Nodes: Negative for mass or adenopathy. Lungs/Pleura: Elevated right  hemidiaphragm with bibasilar atelectasis. Negative for pneumonia or effusion. Upper Abdomen: Hepatosplenomegaly without mass. Small hiatal hernia. Musculoskeletal: Multiple Schmorl's nodes throughout the thoracic spine. Review of the MIP images confirms the above findings. IMPRESSION: Negative for pulmonary embolism Bibasilar atelectasis. Hepatosplenomegaly Electronically Signed   By: Marlan Palau M.D.   On: 06/01/2019 18:21   Dg Chest Portable 1 View  Result Date: 06/01/2019 CLINICAL DATA:  Hypertension EXAM: PORTABLE CHEST 1 VIEW COMPARISON:  04/27/2019 FINDINGS: The heart size is mildly enlarged but stable. There is bibasilar atelectasis. No pneumothorax. No large pleural effusion. No acute osseous abnormality. IMPRESSION: No active disease. Electronically Signed   By: Katherine Mantle M.D.   On: 06/01/2019 15:45   Dg Knee Complete 4 Views Right  Result Date: 06/01/2019 CLINICAL DATA:  Right knee pain. EXAM: RIGHT KNEE - COMPLETE 4+ VIEW COMPARISON:  04/25/2019 FINDINGS: No evidence of fracture, or dislocation. Postsurgical changes from quadriceps tendon repair. Suprapatellar soft tissue swelling. No evidence of arthropathy . IMPRESSION: 1. No acute fracture or dislocation identified about the right knee. 2. Suprapatellar soft tissue swelling in the region of the quadriceps tendon repair and associated subcutaneous edema. Infectious etiology cannot be excluded. Electronically Signed   By: Ted Mcalpine M.D.   On: 06/01/2019 16:10     CBC Recent Labs  Lab 06/01/19 1513 06/02/19 0324  WBC 9.2 9.2  HGB 13.7 13.2  HCT 41.1 39.6  PLT 83* 83*  MCV 95.4 93.8  MCH 31.8 31.3  MCHC 33.3 33.3  RDW 13.4 13.2  LYMPHSABS 0.8  --   MONOABS 1.5*  --  EOSABS 0.1  --   BASOSABS 0.0  --     Chemistries  Recent Labs  Lab 06/01/19 1513 06/01/19 2218 06/02/19 0324  NA 137  --  132*  K 3.1*  --  4.9  CL 110  --  98  CO2 19*  --  25  GLUCOSE 95  --  132*  BUN 9  --  10  CREATININE  0.59*  --  0.90  CALCIUM 6.0*  --  8.8*  MG  --  2.0  --   AST 15  --   --   ALT 13  --   --   ALKPHOS 39  --   --   BILITOT 0.7  --   --    ------------------------------------------------------------------------------------------------------------------ estimated creatinine clearance is 115.5 mL/min (by C-G formula based on SCr of 0.9 mg/dL). ------------------------------------------------------------------------------------------------------------------ No results for input(s): HGBA1C in the last 72 hours. ------------------------------------------------------------------------------------------------------------------ No results for input(s): CHOL, HDL, LDLCALC, TRIG, CHOLHDL, LDLDIRECT in the last 72 hours. ------------------------------------------------------------------------------------------------------------------ No results for input(s): TSH, T4TOTAL, T3FREE, THYROIDAB in the last 72 hours.  Invalid input(s): FREET3 ------------------------------------------------------------------------------------------------------------------ No results for input(s): VITAMINB12, FOLATE, FERRITIN, TIBC, IRON, RETICCTPCT in the last 72 hours.  Coagulation profile No results for input(s): INR, PROTIME in the last 168 hours.  No results for input(s): DDIMER in the last 72 hours.  Cardiac Enzymes Recent Labs  Lab 06/01/19 1513  TROPONINI <0.03   ------------------------------------------------------------------------------------------------------------------ Invalid input(s): POCBNP    Assessment & Plan  Patient is 58 year old presenting with right knee cellulitis    *Right knee cellulitis and concern regarding possible septic arthritis or rerupture of his quadriceps tendon.   Seen by Dr. Renette ButtersPOGGI ealier today, has a knee brace in place of the knee has been ordered currently pending Continue cefepime for now   *Shortness of breath.  No clear etiology.  Patient's lungs sound  clear.  His CT of the chest is clear.  And he has had similar symptoms during recent admission thought to be due to anxiety.    Patient currently states that he is not having any breathing trouble, check echocardiogram of the heart    *Hypocalcemia.  Corrected calcium is still low.    PTH and vitamin D levels were supposed to be ordered I do not see these we will order this  *Schizophrenia.  Continue home medications  *Hypertension.  Continue medications  *DVT prophylaxis with Lovenox     Code Status Orders  (From admission, onward)         Start     Ordered   06/01/19 2027  Full code  Continuous     06/01/19 2031        Code Status History    Date Active Date Inactive Code Status Order ID Comments User Context   04/25/2019 1826 04/28/2019 2206 Full Code [id number]  Poggi, Excell SeltzerJohn J, MD Inpatient   Advance Care Planning Activity    Advance Directive Documentation     Most Recent Value  Type of Advance Directive  Healthcare Power of Attorney  Pre-existing out of facility DNR order (yellow form or pink MOST form)  -  "MOST" Form in Place?  -           Consults orhtopedics  DVT Prophylaxis  Lovenox   Lab Results  Component Value Date   PLT 83 (L) 06/02/2019     Time Spent in minutes 35min Greater than 50% of time spent in care coordination and counseling patient regarding the condition  and plan of care.   Dustin Flock M.D on 06/02/2019 at 12:01 PM  Between 7am to 6pm - Pager - [phone]  After 6pm go to [email or web address] - Proofreader  [facility]   Office  [phone]

## 2019-06-02 NOTE — Anesthesia Postprocedure Evaluation (Signed)
Anesthesia Post Note  Patient: DATON SZILAGYI  Procedure(s) Performed: REPAIR QUADRICEP TENDON (Right ) INCISION AND DRAINAGE (Right )  Patient location during evaluation: PACU Anesthesia Type: General Level of consciousness: awake and alert Pain management: pain level controlled Vital Signs Assessment: post-procedure vital signs reviewed and stable Respiratory status: spontaneous breathing, nonlabored ventilation, respiratory function stable and patient connected to nasal cannula oxygen Cardiovascular status: blood pressure returned to baseline and stable Postop Assessment: no apparent nausea or vomiting Anesthetic complications: no     Last Vitals:  Vitals:   06/02/19 1930 06/02/19 1943  BP: (!) 131/96 (!) 114/91  Pulse: 85 85  Resp: (!) 33 (!) 34  Temp:  36.9 C  SpO2: 95% 95%    Last Pain:  Vitals:   06/02/19 1943  TempSrc:   PainSc: 0-No pain                 Precious Haws Yvaine Jankowiak

## 2019-06-02 NOTE — Anesthesia Preprocedure Evaluation (Addendum)
Anesthesia Evaluation  Patient identified by MRN, date of birth, ID band Patient awake and Patient confused    Reviewed: Allergy & Precautions, H&P , NPO status , Patient's Chart, lab work & pertinent test results  History of Anesthesia Complications Negative for: history of anesthetic complications  Airway Mallampati: III  TM Distance: <3 FB Neck ROM: full    Dental  (+) Chipped, Poor Dentition, Missing, Loose   Pulmonary neg pulmonary ROS, shortness of breath,           Cardiovascular Exercise Tolerance: Good hypertension, (-) angina(-) Past MI and (-) DOE      Neuro/Psych negative neurological ROS  negative psych ROS   GI/Hepatic negative GI ROS, Neg liver ROS,   Endo/Other  negative endocrine ROS  Renal/GU      Musculoskeletal   Abdominal   Peds  Hematology negative hematology ROS (+)   Anesthesia Other Findings Past Medical History: No date: Hypertension  Past Surgical History: 04/25/2019: QUADRICEPS TENDON REPAIR; Right     Comment:  Procedure: REPAIR QUADRICEP TENDON;  Surgeon: Corky Mull, MD;  Location: [facility] ORS;  Service: Orthopedics;                Laterality: Right;  BMI    Body Mass Index: 37.99 kg/m      Reproductive/Obstetrics negative OB ROS                            Anesthesia Physical Anesthesia Plan  ASA: II and emergent  Anesthesia Plan: General ETT, Rapid Sequence and Cricoid Pressure   Post-op Pain Management:    Induction: Intravenous  PONV Risk Score and Plan: Ondansetron, Dexamethasone, Midazolam and Treatment may vary due to age or medical condition  Airway Management Planned: Oral ETT and Video Laryngoscope Planned  Additional Equipment:   Intra-op Plan:   Post-operative Plan: Extubation in OR  Informed Consent: I have reviewed the patients History and Physical, chart, labs and discussed the procedure including the risks,  benefits and alternatives for the proposed anesthesia with the patient or authorized representative who has indicated his/her understanding and acceptance.     Dental Advisory Given  Plan Discussed with: Anesthesiologist, CRNA and Surgeon  Anesthesia Plan Comments: (Phone consent from sister Kalim Kissel at [phone]  She was consented for risks of anesthesia including but not limited to:  - adverse reactions to medications - damage to teeth, lips or other oral mucosa - sore throat or hoarseness - Damage to heart, brain, lungs or loss of life  She voiced understanding.)      Anesthesia Quick Evaluation

## 2019-06-02 NOTE — Consult Note (Signed)
ORTHOPAEDIC CONSULTATION  REQUESTING PHYSICIAN: Auburn BilberryPatel, Shreyang, MD  Chief Complaint:   Right knee pain and swelling status post quadriceps tendon repair  History of Present Illness: Ethan Alvarez is a 58 y.o. male with a history of hypertension and bipolar disorder who is now 5+ weeks status post a primary repair of a right quadriceps tendon rupture.  The patient was discharged to rehab then apparently was discharged home.  The patient is a poor historian, but it is believed that he fell last week.  He presented to the emergency room yesterday afternoon complaining of increased pain and swelling in the knee.  He also appeared to be more confused than usual and was noted to have a fever to 101.9.  The patient was admitted for fever work-up as well as to assess his knee for a possible rerupture of his quadriceps tendon.  Past Medical History:  Diagnosis Date  . Hypertension    Past Surgical History:  Procedure Laterality Date  . QUADRICEPS TENDON REPAIR Right 04/25/2019   Procedure: REPAIR QUADRICEP TENDON;  Surgeon: Christena FlakePoggi,  J, MD;  Location: [facility] ORS;  Service: Orthopedics;  Laterality: Right;   Social History   Socioeconomic History  . Marital status: Single    Spouse name: Not on file  . Number of children: Not on file  . Years of education: Not on file  . Highest education level: Not on file  Occupational History  . Not on file  Social Needs  . Financial resource strain: Not on file  . Food insecurity    Worry: Not on file    Inability: Not on file  . Transportation needs    Medical: Not on file    Non-medical: Not on file  Tobacco Use  . Smoking status: Never Smoker  . Smokeless tobacco: Never Used  Substance and Sexual Activity  . Alcohol use: Not on file  . Drug use: Not on file  . Sexual activity: Not on file  Lifestyle  . Physical activity    Days per week: Not on file    Minutes per session: Not  on file  . Stress: Not on file  Relationships  . Social Musicianconnections    Talks on phone: Not on file    Gets together: Not on file    Attends religious service: Not on file    Active member of club or organization: Not on file    Attends meetings of clubs or organizations: Not on file    Relationship status: Not on file  Other Topics Concern  . Not on file  Social History Narrative  . Not on file   History reviewed. No pertinent family history. Allergies  Allergen Reactions  . Haloperidol Lactate Other (See Comments)    Per [facility]; nature of intolerance unclear intolerance Other reaction(s): Other (See Comments) Per [facility]; nature of intolerance unclear    Prior to Admission medications   Medication Sig Start Date End Date Taking? Authorizing Provider  acetaminophen (TYLENOL) 325 MG tablet Take 650 mg by mouth every 4 (four) hours as needed for mild pain. 03/21/17  Yes [provider]  benztropine (COGENTIN) 0.5 MG tablet Take 0.5 mg by mouth every morning.    Yes [provider]  benztropine (COGENTIN) 1 MG tablet Take 1 mg by mouth 2 (two) times daily.  03/25/19  Yes [provider]  clonazePAM (KLONOPIN) 0.5 MG tablet Take 0.5 mg by mouth 3 (three) times daily as needed for agitation. 04/08/19  Yes [provider]  divalproex (DEPAKOTE) 500 MG DR tablet Take 1,000 mg by mouth 2 (two) times a day. 02/13/17 07/23/19 Yes [provider]  INVEGA SUSTENNA 234 MG/1.5ML SUSY injection Inject 234 mg into the muscle every 30 (thirty) days. 04/10/19  Yes [provider]  ketoconazole (NIZORAL) 2 % cream Apply 1 application topically. To groin and surrounding areas twice daily until resolved   Yes [provider]  LORazepam (ATIVAN) 0.5 MG tablet Take 0.5 mg by mouth every morning.    Yes [provider]  LORazepam (ATIVAN) 0.5 MG tablet Take 0.5 mg by mouth daily as needed for anxiety.   Yes [provider]  LORazepam  (ATIVAN) 1 MG tablet Take 1 mg by mouth 2 (two) times daily.  01/23/19  Yes [provider]  metroNIDAZOLE (METROGEL) 0.75 % gel Apply 1 application topically daily. 04/04/19  Yes [provider]  OLANZapine (ZYPREXA) 15 MG tablet Take 15 mg by mouth daily. 03/25/19 07/23/19 Yes [provider]  oxyCODONE (OXY IR/ROXICODONE) 5 MG immediate release tablet Take 1-2 tablets (5-10 mg total) by mouth every 4 (four) hours as needed for moderate pain. 04/27/19  Yes Lattie Corns, PA-C  propranolol (INDERAL) 20 MG tablet Take 20 mg by mouth daily.  08/12/18  Yes [provider]  sertraline (ZOLOFT) 100 MG tablet Take 100 mg by mouth 2 (two) times a day. 01/30/18 07/23/19 Yes [provider]  enoxaparin (LOVENOX) 40 MG/0.4ML injection Inject 0.4 mLs (40 mg total) into the skin daily. Patient not taking: Reported on 06/01/2019 04/27/19   Lattie Corns, PA-C   Ct Angio Chest Pe W And/or Wo Contrast  Result Date: 06/01/2019 CLINICAL DATA:  Short of breath EXAM: CT ANGIOGRAPHY CHEST WITH CONTRAST TECHNIQUE: Multidetector CT imaging of the chest was performed using the standard protocol during bolus administration of intravenous contrast. Multiplanar CT image reconstructions and MIPs were obtained to evaluate the vascular anatomy. CONTRAST:  8mL OMNIPAQUE IOHEXOL 350 MG/ML SOLN COMPARISON:  06/01/2019, CT angio chest 04/27/2019 FINDINGS: Cardiovascular: Adequate vascular enhancement. Image quality degraded by moderate motion. Negative for pulmonary embolism. Pulmonary arteries normal in caliber. Normal aortic arch. Heart size normal. No significant coronary calcification. Mediastinum/Nodes: Negative for mass or adenopathy. Lungs/Pleura: Elevated right hemidiaphragm with bibasilar atelectasis. Negative for pneumonia or effusion. Upper Abdomen: Hepatosplenomegaly without mass. Small hiatal hernia. Musculoskeletal: Multiple Schmorl's nodes throughout the thoracic spine. Review  of the MIP images confirms the above findings. IMPRESSION: Negative for pulmonary embolism Bibasilar atelectasis. Hepatosplenomegaly Electronically Signed   By: Franchot Gallo M.D.   On: 06/01/2019 18:21   Dg Chest Portable 1 View  Result Date: 06/01/2019 CLINICAL DATA:  Hypertension EXAM: PORTABLE CHEST 1 VIEW COMPARISON:  04/27/2019 FINDINGS: The heart size is mildly enlarged but stable. There is bibasilar atelectasis. No pneumothorax. No large pleural effusion. No acute osseous abnormality. IMPRESSION: No active disease. Electronically Signed   By: Constance Holster M.D.   On: 06/01/2019 15:45   Dg Knee Complete 4 Views Right  Result Date: 06/01/2019 CLINICAL DATA:  Right knee pain. EXAM: RIGHT KNEE - COMPLETE 4+ VIEW COMPARISON:  04/25/2019 FINDINGS: No evidence of fracture, or dislocation. Postsurgical changes from quadriceps tendon repair. Suprapatellar soft tissue swelling. No evidence of arthropathy . IMPRESSION: 1. No acute fracture or dislocation identified about the right knee. 2. Suprapatellar soft tissue swelling in the region of the quadriceps tendon repair and associated subcutaneous edema. Infectious etiology cannot be excluded. Electronically Signed   By: Thomas Hoff  Dimitrova M.D.   On: 06/01/2019 16:10    Positive ROS: All other systems have been reviewed and were otherwise negative with the exception of those mentioned in the HPI and as above.  Physical Exam: General:  Alert, no acute distress Psychiatric:  Patient is reasonably competent for consent with normal mood and affect   Cardiovascular:  No pedal edema Respiratory:  No wheezing, non-labored breathing GI:  Abdomen is soft and non-tender Skin:  No lesions in the area of chief complaint Neurologic:  Sensation intact distally Lymphatic:  No axillary or cervical lymphadenopathy  Orthopedic Exam:  Orthopedic examination is limited to the right knee and lower extremity.  The surgical incision demonstrates some superficial  dehiscence over the patella with a small amount of serous drainage but no obvious purulence.  There is some erythema along the incision, especially proximally, as well as some surrounding swelling.  There is a 1+ effusion as well.  The patient has difficulty performing an active straight leg raise.  He is able to tolerate limited range of motion from 10 to 45 degrees passively.  He is neurovascularly intact to the right lower extremity and foot.  X-rays:  Recent x-rays of the right knee are available for review and have been reviewed by myself.  These films demonstrate 3 suture anchors in the superior pole of the patella which appear to be in good position and without evidence of loosening.  No other acute bony abnormalities are identified.  There does appear to be evidence of a knee effusion.  Assessment: Superficial wound infection status post primary repair of a right quadriceps tendon rupture.  Plan: The treatment options have been discussed with the patient.  I agree with the plan to proceed with an MRI scan of the right knee to evaluate the integrity of the repair.  I also agree with initiating IV antibiotics to treat the wound cellulitis.  Based on the MRI scan results, I will decide what the next plan of treatment will be.    Maryagnes AmosJ. Jeffrey , MD  Beeper #:  [phone]  06/02/2019 10:32 AM

## 2019-06-02 NOTE — Transfer of Care (Signed)
Immediate Anesthesia Transfer of Care Note  Patient: Ethan Alvarez  Procedure(s) Performed: REPAIR QUADRICEP TENDON (Right ) INCISION AND DRAINAGE (Right )  Patient Location: PACU  Anesthesia Type:General  Level of Consciousness: sedated  Airway & Oxygen Therapy: Patient Spontanous Breathing and Patient connected to face mask oxygen  Post-op Assessment: Report given to RN and Post -op Vital signs reviewed and stable  Post vital signs: Reviewed and stable  Last Vitals:  Vitals Value Taken Time  BP 137/70 06/02/19 1815  Temp 36.7 C 06/02/19 1815  Pulse 90 06/02/19 1819  Resp 31 06/02/19 1819  SpO2 93 % 06/02/19 1819  Vitals shown include unvalidated device data.  Last Pain:  Vitals:   06/02/19 0822  TempSrc:   PainSc: 0-No pain         Complications: No apparent anesthesia complications

## 2019-06-02 NOTE — Progress Notes (Signed)
Went to his room twice- pt still in OR MSSA bacteremia due to left septic knee following recent surgery for torn quadriceps- has  Titanium anchors Currently on cefepime and vanco- will change to nafcillin or cefazolin-as he is on many psch meds rifampin may not be an option because of drug interactions

## 2019-06-02 NOTE — OR Nursing (Signed)
3 anchors explanted from Right knee.

## 2019-06-03 ENCOUNTER — Encounter: Payer: Self-pay | Admitting: Surgery

## 2019-06-03 ENCOUNTER — Inpatient Hospital Stay
Admit: 2019-06-03 | Discharge: 2019-06-03 | Disposition: A | Discharge status: Home / Self Care | Payer: Medicare Other | Attending: Internal Medicine | Admitting: Internal Medicine

## 2019-06-03 DIAGNOSIS — Z79899 Other long term (current) drug therapy: Secondary | ICD-10-CM

## 2019-06-03 DIAGNOSIS — Z8739 Personal history of other diseases of the musculoskeletal system and connective tissue: Secondary | ICD-10-CM

## 2019-06-03 DIAGNOSIS — R4183 Borderline intellectual functioning: Secondary | ICD-10-CM

## 2019-06-03 DIAGNOSIS — I10 Essential (primary) hypertension: Secondary | ICD-10-CM

## 2019-06-03 DIAGNOSIS — T8149XA Infection following a procedure, other surgical site, initial encounter: Secondary | ICD-10-CM

## 2019-06-03 DIAGNOSIS — B9561 Methicillin susceptible Staphylococcus aureus infection as the cause of diseases classified elsewhere: Secondary | ICD-10-CM

## 2019-06-03 DIAGNOSIS — Z888 Allergy status to other drugs, medicaments and biological substances status: Secondary | ICD-10-CM

## 2019-06-03 DIAGNOSIS — Z978 Presence of other specified devices: Secondary | ICD-10-CM

## 2019-06-03 DIAGNOSIS — F209 Schizophrenia, unspecified: Secondary | ICD-10-CM

## 2019-06-03 DIAGNOSIS — M00061 Staphylococcal arthritis, right knee: Secondary | ICD-10-CM

## 2019-06-03 DIAGNOSIS — Z9181 History of falling: Secondary | ICD-10-CM

## 2019-06-03 DIAGNOSIS — R7881 Bacteremia: Secondary | ICD-10-CM

## 2019-06-03 LAB — CBC
HCT: 40.6 % (ref 39.0–52.0)
Hemoglobin: 13.2 g/dL (ref 13.0–17.0)
MCH: 31.7 pg (ref 26.0–34.0)
MCHC: 32.5 g/dL (ref 30.0–36.0)
MCV: 97.6 fL (ref 80.0–100.0)
Platelets: 78 10*3/uL — ABNORMAL LOW (ref 150–400)
RBC: 4.16 MIL/uL — ABNORMAL LOW (ref 4.22–5.81)
RDW: 13 % (ref 11.5–15.5)
WBC: 5.8 10*3/uL (ref 4.0–10.5)
nRBC: 0 % (ref 0.0–0.2)

## 2019-06-03 LAB — BASIC METABOLIC PANEL
Anion gap: 9 (ref 5–15)
BUN: 14 mg/dL (ref 6–20)
CO2: 24 mmol/L (ref 22–32)
Calcium: 8.5 mg/dL — ABNORMAL LOW (ref 8.9–10.3)
Chloride: 102 mmol/L (ref 98–111)
Creatinine, Ser: 0.75 mg/dL (ref 0.61–1.24)
GFR calc Af Amer: >60 mL/min (ref >60)
GFR calc non Af Amer: >60 mL/min (ref >60)
Glucose, Bld: 98 mg/dL (ref 70–99)
Potassium: 4.8 mmol/L (ref 3.5–5.1)
Sodium: 135 mmol/L (ref 135–145)

## 2019-06-03 LAB — VITAMIN D 25 HYDROXY (VIT D DEFICIENCY, FRACTURES)
Vit D, 25-Hydroxy: 42.6 ng/mL (ref 30.0–100.0)
Vit D, 25-Hydroxy: 30.3 ng/mL (ref 30.0–100.0)

## 2019-06-03 LAB — HIV ANTIBODY (ROUTINE TESTING W REFLEX): HIV Screen 4th Generation wRfx: NONREACTIVE

## 2019-06-03 LAB — CALCITRIOL (1,25 DI-OH VIT D): Vit D, 1,25-Dihydroxy: 10.1 pg/mL — ABNORMAL LOW (ref 19.9–79.3)

## 2019-06-03 LAB — PARATHYROID HORMONE, INTACT (NO CA)
PTH: 18 pg/mL (ref 15–65)
PTH: 18 pg/mL (ref 15–65)

## 2019-06-03 MED ORDER — PALIPERIDONE PALMITATE ER 234 MG/1.5ML IM SUSY
234.0000 mg | PREFILLED_SYRINGE | Freq: Once | INTRAMUSCULAR | Status: AC
  Administered 2019-06-03: 234 mg via INTRAMUSCULAR
  Filled 2019-06-03: qty 1.5

## 2019-06-03 MED ORDER — CEFAZOLIN SODIUM-DEXTROSE 2-4 GM/100ML-% IV SOLN
2.0000 g | Freq: Three times a day (TID) | INTRAVENOUS | Status: DC
  Administered 2019-06-03 – 2019-06-09 (×18): 2 g via INTRAVENOUS
  Filled 2019-06-03 (×22): qty 100

## 2019-06-03 MED ORDER — CALCIUM CARBONATE-VITAMIN D 500-200 MG-UNIT PO TABS
2.0000 | ORAL_TABLET | Freq: Two times a day (BID) | ORAL | Status: DC
  Administered 2019-06-03 – 2019-06-09 (×12): 2 via ORAL
  Filled 2019-06-03 (×12): qty 2

## 2019-06-03 NOTE — Progress Notes (Signed)
*  PRELIMINARY RESULTS* Echocardiogram 2D Echocardiogram has been performed.  Sherrie Sport 06/03/2019, 2:22 PM

## 2019-06-03 NOTE — Consult Note (Signed)
NAME: Ethan Alvarez  DOB: 06/05/1961  MRN: [id number]  Date/Time: 06/03/2019 11:32 AM  REQUESTING PROVIDER:sudini Subjective:  REASON FOR CONSULT: Staph aureus bacteremia   Ethan Alvarez is a 58 y.o. male with a history of schizophrenia, borderline intellectual functioning, hypertension Is admitted for bacteremia.  Patient [facility] after a fall and found on the floor and had sustained a right quadriceps tendon rupture.  He was taken to the OR by Dr. Joice LoftsPoggi and had primary repair of the right quadriceps tendon.  There were 3 titanium anchors in the tendon.  He had received cefazolin prior to the surgery as well as for 24 hours.  He was discharged on 04/28/2019.  He presented to the ED on 06/01/2019 with shortness of breath.  He was at the halls failed rehab and nursing  facility.  In the ED temperature initially was 97.3 and later it went up to 101.9.  BP was 130/60, heart rate of 96 respiratory rate of 27 and initial pulse ox of 100%.  Initial labs showed a WBC of 9.2, hemoglobin of 13.7 and platelet of 83.  He had a creatinine of 0.59.  And a calcium of 6.  His albumin was 2.4.  Chest x-ray showed a mildly enlarged heart and bibasilar atelectasis with no pleural effusion.  He also had a CTA and it was negative for pulmonary embolism.  He also had x-ray of the knee which showed suprapatellar soft tissue swelling in the region of the quadriceps tendon repair and associated subcutaneous edema.  Blood cultures were sent.  He was started on vancomycin and cefepime. There was a concern for septic arthritis of the knee knee joint and an MRI was ordered and showed a large joint effusion with septation and debris present.  There was also edema throughout the patella most consistent with osteomyelitis. He was seen by Dr. Joice LoftsPoggi the orthopedic surgeon.  And has been taken to the OR. I am asked to see the patient as the blood cultures came back positive for staph aureus.  Past medical  history Acute pancreatitis Schizophrenia  borderline intellectual functioning History of adenomatous polyp of colon 10/07/2014 Hyperprolactinemia secondary to medication Hypertension OCD Rosacea Thrombocytopenia due to medication.  Past surgical history Recent right quadriceps tendon repair  Social history  single Non-smoker No drug use No alcohol?   History reviewed. No pertinent family history. Allergies  Allergen Reactions  . Haloperidol Lactate Other (See Comments)    Per [facility]; nature of intolerance unclear intolerance Other reaction(s): Other (See Comments) Per [facility]; nature of intolerance unclear     ? Current Facility-Administered Medications  Medication Dose Route Frequency Provider Last Rate Last Dose  . 0.9 %  sodium chloride infusion   Intravenous Continuous Poggi, Excell SeltzerJohn J, MD 75 mL/hr at 06/03/19 1007    . acetaminophen (TYLENOL) tablet 1,000 mg  1,000 mg Oral Q6H Poggi, Excell SeltzerJohn J, MD   1,000 mg at 06/03/19 0529  . acetaminophen (TYLENOL) tablet 325-650 mg  325-650 mg Oral Q6H PRN Poggi, Excell SeltzerJohn J, MD      . benztropine (COGENTIN) tablet 0.5 mg  0.5 mg Oral BH-q7a Poggi, Excell SeltzerJohn J, MD   0.5 mg at 06/03/19 0916  . benztropine (COGENTIN) tablet 1 mg  1 mg Oral BID Poggi, Excell SeltzerJohn J, MD   1 mg at 06/03/19 0917  . bisacodyl (DULCOLAX) suppository 10 mg  10 mg Rectal Daily PRN Poggi, Excell SeltzerJohn J, MD      . ceFEPIme (MAXIPIME) 2  g in sodium chloride 0.9 % 100 mL IVPB  2 g Intravenous Q8H Poggi, Marshall Cork, MD   Stopped at 06/03/19 3235  . clonazePAM (KLONOPIN) tablet 0.5 mg  0.5 mg Oral TID PRN Poggi, Marshall Cork, MD      . diphenhydrAMINE (BENADRYL) 12.5 MG/5ML elixir 12.5-25 mg  12.5-25 mg Oral Q4H PRN Poggi, Marshall Cork, MD      . divalproex (DEPAKOTE) DR tablet 1,000 mg  1,000 mg Oral BID Corky Mull, MD   1,000 mg at 06/03/19 0917  . docusate sodium (COLACE) capsule 100 mg  100 mg Oral BID Corky Mull, MD   100 mg at 06/03/19 0918  . enoxaparin (LOVENOX) injection 40 mg  40 mg Subcutaneous  Q24H Poggi, Marshall Cork, MD   40 mg at 06/03/19 0918  . HYDROmorphone (DILAUDID) injection 0.5-1 mg  0.5-1 mg Intravenous Q4H PRN Poggi, Marshall Cork, MD      . ipratropium-albuterol (DUONEB) 0.5-2.5 (3) MG/3ML nebulizer solution 3 mL  3 mL Nebulization Q4H PRN Poggi, Marshall Cork, MD      . ketorolac (TORADOL) 15 MG/ML injection 15 mg  15 mg Intravenous Q6H Poggi, Marshall Cork, MD   15 mg at 06/03/19 0530  . LORazepam (ATIVAN) tablet 1 mg  1 mg Oral BID Poggi, Marshall Cork, MD   1 mg at 06/03/19 0918  . magnesium hydroxide (MILK OF MAGNESIA) suspension 30 mL  30 mL Oral Daily PRN Poggi, Marshall Cork, MD      . metoCLOPramide (REGLAN) tablet 5-10 mg  5-10 mg Oral Q8H PRN Poggi, Marshall Cork, MD       Or  . metoCLOPramide (REGLAN) injection 5-10 mg  5-10 mg Intravenous Q8H PRN Poggi, Marshall Cork, MD      . OLANZapine (ZYPREXA) tablet 15 mg  15 mg Oral Daily Poggi, Marshall Cork, MD   15 mg at 06/03/19 0917  . ondansetron (ZOFRAN) tablet 4 mg  4 mg Oral Q6H PRN Poggi, Marshall Cork, MD       Or  . ondansetron (ZOFRAN) injection 4 mg  4 mg Intravenous Q6H PRN Poggi, Marshall Cork, MD      . oxyCODONE (Oxy IR/ROXICODONE) immediate release tablet 5-10 mg  5-10 mg Oral Q4H PRN Poggi, Marshall Cork, MD      . polyethylene glycol (MIRALAX / GLYCOLAX) packet 17 g  17 g Oral Daily PRN Poggi, Marshall Cork, MD      . propranolol (INDERAL) tablet 20 mg  20 mg Oral Daily Poggi, Marshall Cork, MD   20 mg at 06/03/19 0918  . senna (SENOKOT) tablet 8.6 mg  1 tablet Oral BID Poggi, Marshall Cork, MD   8.6 mg at 06/03/19 0918  . sertraline (ZOLOFT) tablet 100 mg  100 mg Oral BID Corky Mull, MD   100 mg at 06/03/19 0918  . sodium chloride flush (NS) 0.9 % injection 3 mL  3 mL Intravenous Q12H Poggi, Marshall Cork, MD   3 mL at 06/03/19 0947  . sodium phosphate (FLEET) 7-19 GM/118ML enema 1 enema  1 enema Rectal Once PRN Poggi, Marshall Cork, MD      . traMADol Veatrice Bourbon) tablet 50 mg  50 mg Oral Q6H PRN Poggi, Marshall Cork, MD      . vancomycin (VANCOCIN) IVPB 1000 mg/200 mL premix  1,000 mg Intravenous Q12H Poggi, Marshall Cork, MD  200 mL/hr at 06/03/19 1010 1,000 mg at 06/03/19 1010     Abtx:  Anti-infectives (From admission, onward)  Start     Dose/Rate Route Frequency Ordered Stop   06/02/19 2015  vancomycin (VANCOCIN) IVPB 1000 mg/200 mL premix     1,000 mg 200 mL/hr over 60 Minutes Intravenous Every 12 hours 06/02/19 2007     06/02/19 1000  vancomycin (VANCOCIN) 1,500 mg in sodium chloride 0.9 % 500 mL IVPB  Status:  Discontinued     1,500 mg 250 mL/hr over 120 Minutes Intravenous Every 12 hours 06/01/19 1758 06/01/19 2328   06/02/19 1000  vancomycin (VANCOCIN) 1,500 mg in sodium chloride 0.9 % 500 mL IVPB  Status:  Discontinued     1,500 mg 250 mL/hr over 120 Minutes Intravenous Every 12 hours 06/01/19 2328 06/02/19 1032   06/01/19 1800  vancomycin (VANCOCIN) 2,500 mg in sodium chloride 0.9 % 500 mL IVPB     2,500 mg 250 mL/hr over 120 Minutes Intravenous  Once 06/01/19 1753 06/01/19 2227   06/01/19 1800  ceFEPIme (MAXIPIME) 2 g in sodium chloride 0.9 % 100 mL IVPB     2 g 200 mL/hr over 30 Minutes Intravenous Every 8 hours 06/01/19 1754        REVIEW OF SYSTEMS:  NA as patient is sleeping Objective:  VITALS:  BP 120/73 (BP Location: Left Arm)   Pulse 77   Temp (!) 97.5 F (36.4 C)   Resp 19   Ht 5\' 9"  (1.753 m)   Wt 118.3 kg   SpO2 98%   BMI 38.51 kg/m  PHYSICAL EXAM: examination limited General:sleeping, on calling his name he kept saying doctor and wentt back to sleep Head: Normocephalic, without obvious abnormality, atraumatic. Eyes:not examined ENT not examined Back: not examined Lungs: b/l air entry Heart: s1s2 Abdomen: Soft,  Extremities:rt leg surgical dressing- drain present Skin: limited examination-No rashes or lesions. Or bruising Lymph: Cervical, supraclavicular normal. Neurologic: cannot be examined Pertinent Labs Lab Results CBC    Component Value Date/Time   WBC 5.8 06/03/2019 0642   RBC 4.16 (L) 06/03/2019 0642   HGB 13.2 06/03/2019 0642   HCT 40.6 06/03/2019  0642   PLT 78 (L) 06/03/2019 0642   MCV 97.6 06/03/2019 0642   MCH 31.7 06/03/2019 0642   MCHC 32.5 06/03/2019 0642   RDW 13.0 06/03/2019 0642   LYMPHSABS 0.8 06/01/2019 1513   MONOABS 1.5 (H) 06/01/2019 1513   EOSABS 0.1 06/01/2019 1513   BASOSABS 0.0 06/01/2019 1513    CMP Latest Ref Rng & Units 06/03/2019 06/02/2019 06/01/2019  Glucose 70 - 99 mg/dL 98 469(G132(H) 95  BUN 6 - 20 mg/dL 14 10 9   Creatinine 0.61 - 1.24 mg/dL 2.950.75 2.840.90 1.32(G0.59(L)  Sodium 135 - 145 mmol/L 135 132(L) 137  Potassium 3.5 - 5.1 mmol/L 4.8 4.9 3.1(L)  Chloride 98 - 111 mmol/L 102 98 110  CO2 22 - 32 mmol/L 24 25 19(L)  Calcium 8.9 - 10.3 mg/dL 4.0(N8.5(L) 0.2(V8.8(L) 6.0(LL)  Total Protein 6.5 - 8.1 g/dL - - 4.8(L)  Total Bilirubin 0.3 - 1.2 mg/dL - - 0.7  Alkaline Phos 38 - 126 U/L - - 39  AST 15 - 41 U/L - - 15  ALT 0 - 44 U/L - - 13      Microbiology: Recent Results (from the past 240 hour(s))  Blood culture (routine x 2)     Status: Abnormal (Preliminary result)   Collection Time: 06/01/19  3:45 PM   Specimen: BLOOD  Result Value Ref Range Status   Specimen Description   Final    BLOOD LEFT ANTECUBITAL  Performed at [facility], [address] [place], [place] [address]    Special Requests   Final    BOTTLES DRAWN AEROBIC AND ANAEROBIC Blood Culture results may not be optimal due to an excessive volume of blood received in culture bottles Performed at [facility], [address] [place], [place] [address]    Culture  Setup Time   Final    GRAM POSITIVE COCCI ANAEROBIC BOTTLE ONLY CRITICAL RESULT CALLED TO, READ BACK BY AND VERIFIED WITH: Crist Fat AT 1000 06/02/2019 SDR Performed at [facility], [address], [place], [place] [address]    Culture STAPHYLOCOCCUS AUREUS (A)  Final   Report Status PENDING  Incomplete  Blood Culture ID Panel (Reflexed)     Status: Abnormal   Collection Time: 06/01/19  3:45 PM  Result Value Ref Range Status   Enterococcus species NOT  DETECTED NOT DETECTED Final   Listeria monocytogenes NOT DETECTED NOT DETECTED Final   Staphylococcus species DETECTED (A) NOT DETECTED Final    Comment: CRITICAL RESULT CALLED TO, READ BACK BY AND VERIFIED WITH:  Crist Fat AT 1000 06/02/2019 SDR    Staphylococcus aureus (BCID) DETECTED (A) NOT DETECTED Final    Comment: Methicillin (oxacillin) susceptible Staphylococcus aureus (MSSA). Preferred therapy is anti staphylococcal beta lactam antibiotic (Cefazolin or Nafcillin), unless clinically contraindicated. CRITICAL RESULT CALLED TO, READ BACK BY AND VERIFIED WITH:  Crist Fat AT 1000 06/02/2019 SDR    Methicillin resistance NOT DETECTED NOT DETECTED Final   Streptococcus species NOT DETECTED NOT DETECTED Final   Streptococcus agalactiae NOT DETECTED NOT DETECTED Final   Streptococcus pneumoniae NOT DETECTED NOT DETECTED Final   Streptococcus pyogenes NOT DETECTED NOT DETECTED Final   Acinetobacter baumannii NOT DETECTED NOT DETECTED Final   Enterobacteriaceae species NOT DETECTED NOT DETECTED Final   Enterobacter cloacae complex NOT DETECTED NOT DETECTED Final   Escherichia coli NOT DETECTED NOT DETECTED Final   Klebsiella oxytoca NOT DETECTED NOT DETECTED Final   Klebsiella pneumoniae NOT DETECTED NOT DETECTED Final   Proteus species NOT DETECTED NOT DETECTED Final   Serratia marcescens NOT DETECTED NOT DETECTED Final   Haemophilus influenzae NOT DETECTED NOT DETECTED Final   Neisseria meningitidis NOT DETECTED NOT DETECTED Final   Pseudomonas aeruginosa NOT DETECTED NOT DETECTED Final   Candida albicans NOT DETECTED NOT DETECTED Final   Candida glabrata NOT DETECTED NOT DETECTED Final   Candida krusei NOT DETECTED NOT DETECTED Final   Candida parapsilosis NOT DETECTED NOT DETECTED Final   Candida tropicalis NOT DETECTED NOT DETECTED Final    Comment: Performed at [facility], [address] [place], [place] [address]  SARS Coronavirus 2 (CEPHEID- Performed in [facility] hospital lab), Hosp Order     Status: None   Collection Time: 06/01/19  5:47 PM   Specimen: Nasopharyngeal Swab  Result Value Ref Range Status   SARS Coronavirus 2 NEGATIVE NEGATIVE Final    Comment: (NOTE) If result is NEGATIVE SARS-CoV-2 target nucleic acids are NOT DETECTED. The SARS-CoV-2 RNA is generally detectable in upper and lower  respiratory specimens during the acute phase of infection. The lowest  concentration of SARS-CoV-2 viral copies this assay can detect is 250  copies / mL. A negative result does not preclude SARS-CoV-2 infection  and should not be used as the sole basis for treatment or other  patient management decisions.  A negative result may occur with  improper specimen collection / handling, submission of specimen other  than nasopharyngeal swab, presence of viral mutation(s) within the  areas targeted by this assay, and inadequate number of viral copies  (<250 copies / mL). A negative result must be combined with clinical  observations, patient history, and epidemiological information. If result is POSITIVE SARS-CoV-2 target nucleic acids are DETECTED. The SARS-CoV-2 RNA is generally detectable in upper and lower  respiratory specimens dur ing the acute phase of infection.  Positive  results are indicative of active infection with SARS-CoV-2.  Clinical  correlation with patient history and other diagnostic information is  necessary to determine patient infection status.  Positive results do  not rule out bacterial infection or co-infection with other viruses. If result is PRESUMPTIVE POSTIVE SARS-CoV-2 nucleic acids MAY BE PRESENT.   A presumptive positive result was obtained on the submitted specimen  and confirmed on repeat testing.  While 2019 novel coronavirus  (SARS-CoV-2) nucleic acids may be present in the submitted sample  additional confirmatory testing may be necessary for epidemiological  and / or clinical management purposes  to  differentiate between  SARS-CoV-2 and other Sarbecovirus currently known to infect humans.  If clinically indicated additional testing with an alternate test  methodology [phone]([id number]) is advised. The SARS-CoV-2 RNA is generally  detectable in upper and lower respiratory sp ecimens during the acute  phase of infection. The expected result is Negative. Fact Sheet for Patients:  BoilerBrush.com.cyhttps://www.fda.gov/media/136312/download Fact Sheet for Healthcare Providers: https://pope.com/https://www.fda.gov/media/136313/download This test is not yet approved or cleared by the [place] FDA and has been authorized for detection and/or diagnosis of SARS-CoV-2 by FDA under an Emergency Use Authorization (EUA).  This EUA will remain in effect (meaning this test can be used) for the duration of the COVID-19 declaration under Section 564(b)(1) of the Act, 21 U.S.C. section 360bbb-3(b)(1), unless the authorization is terminated or revoked sooner. Performed at [facility], [address] [place], [place] [address]   Blood culture (routine x 2)     Status: None (Preliminary result)   Collection Time: 06/01/19 10:17 PM   Specimen: BLOOD  Result Value Ref Range Status   Specimen Description BLOOD BLOOD RIGHT HAND  Final   Special Requests   Final    BOTTLES DRAWN AEROBIC AND ANAEROBIC Blood Culture adequate volume   Culture   Final    NO GROWTH 2 DAYS Performed at [facility], [address] [place], [place] [address]    Report Status PENDING  Incomplete  Aerobic/Anaerobic Culture (surgical/deep wound)     Status: None (Preliminary result)   Collection Time: 06/02/19  4:03 PM   Specimen: Wound  Result Value Ref Range Status   Specimen Description   Final    WOUND Performed at [facility], [address] [place], [place] [address]    Special Requests RIGHT KNEE JOINT  Final   Gram Stain NO WBC SEEN NO ORGANISMS SEEN   Final   Culture   Final    TOO YOUNG TO READ Performed at  [facility], [address] [place], [place] [address]    Report Status PENDING  Incomplete    IMAGING RESULTS: I have personally reviewed the films ? Impression/Recommendation ? ?Staph aureus bacteremia- secondary to infected rt knee surgical site along with septic arthritis and infected tendon and also possible bone at the site on the anchors- currently on cefepime and vanco- change to cefazolin  Recent fall with rupture of quad tendon- s/p repair on 04/25/19  Schizophrenia on olanzapine, lorazepam, sertraline ?  ___________________________________________________ Discussed the management with the pharmacist Note:  This document was prepared using Dragon voice recognition software and may include unintentional dictation errors.

## 2019-06-03 NOTE — [place] FL2 (Addendum)
[place] MEDICAID FL2 LEVEL OF CARE SCREENING TOOL     IDENTIFICATION  Patient Name: Ethan AlmondDouglas M Mankowski Birthdate: 03/29/1961 Sex: male Admission Date (Current Location): 06/01/2019  [facility] [place]   Facility and Address:         Provider Number: [id number]  Attending Physician Name and Address:  Auburn BilberryPatel, Shreyang, MD  Relative Name and Phone Number:       Current Level of Care: Hospital Recommended Level of Care: Skilled Nursing Facility Prior Approval Number:    Date Approved/Denied:   PASRR Number:    Discharge Plan: SNF    Current Diagnoses: Patient Active Problem List   Diagnosis Date Noted  . Cellulitis of right knee 06/01/2019  . Rupture of right quadriceps tendon 04/25/2019    Orientation RESPIRATION BLADDER Height & Weight     Self, Place  O2(2 Liters Oxygen.) Incontinent Weight: 260 lb 12.9 oz (118.3 kg) Height:  5\' 9"  (175.3 cm)  BEHAVIORAL SYMPTOMS/MOOD NEUROLOGICAL BOWEL NUTRITION STATUS      Continent Diet(Diet: Carb Modified.)  AMBULATORY STATUS COMMUNICATION OF NEEDS Skin   Extensive Assist Verbally Surgical wounds(Incision: Right Knee.)                       Personal Care Assistance Level of Assistance  Bathing, Feeding, Dressing Bathing Assistance: Limited assistance Feeding assistance: Limited assistance Dressing Assistance: Limited assistance     Functional Limitations Info  Sight, Hearing, Speech Sight Info: Adequate Hearing Info: Adequate Speech Info: Adequate    SPECIAL CARE FACTORS FREQUENCY  PT (By licensed PT), OT (By licensed OT)     PT Frequency: 5 OT Frequency: 5            Contractures      Additional Factors Info  Code Status, Allergies Code Status Info: Full Code. Allergies Info: Haloperidol Lactate           Current Medications (06/03/2019):  This is the current hospital active medication list Current Facility-Administered Medications  Medication Dose Route Frequency Provider  Last Rate Last Dose  . 0.9 %  sodium chloride infusion   Intravenous Continuous Poggi, Excell SeltzerJohn J, MD 75 mL/hr at 06/03/19 1007    . acetaminophen (TYLENOL) tablet 1,000 mg  1,000 mg Oral Q6H Poggi, Excell SeltzerJohn J, MD   1,000 mg at 06/03/19 0529  . acetaminophen (TYLENOL) tablet 325-650 mg  325-650 mg Oral Q6H PRN Poggi, Excell SeltzerJohn J, MD      . benztropine (COGENTIN) tablet 0.5 mg  0.5 mg Oral BH-q7a Poggi, Excell SeltzerJohn J, MD   0.5 mg at 06/03/19 0916  . benztropine (COGENTIN) tablet 1 mg  1 mg Oral BID Poggi, Excell SeltzerJohn J, MD   1 mg at 06/03/19 0917  . bisacodyl (DULCOLAX) suppository 10 mg  10 mg Rectal Daily PRN Poggi, Excell SeltzerJohn J, MD      . calcium-vitamin D (OSCAL WITH D) 500-200 MG-UNIT per tablet 2 tablet  2 tablet Oral BID Auburn BilberryPatel, Shreyang, MD      . ceFEPIme (MAXIPIME) 2 g in sodium chloride 0.9 % 100 mL IVPB  2 g Intravenous Q8H Poggi, Excell SeltzerJohn J, MD   Stopped at 06/03/19 [id number]  . clonazePAM (KLONOPIN) tablet 0.5 mg  0.5 mg Oral TID PRN Poggi, Excell SeltzerJohn J, MD      . diphenhydrAMINE (BENADRYL) 12.5 MG/5ML elixir 12.5-25 mg  12.5-25 mg Oral Q4H PRN Poggi, Excell SeltzerJohn J, MD      . divalproex (DEPAKOTE) DR tablet 1,000 mg  1,000 mg  Oral BID Corky Mull, MD   1,000 mg at 06/03/19 0917  . docusate sodium (COLACE) capsule 100 mg  100 mg Oral BID Corky Mull, MD   100 mg at 06/03/19 0918  . enoxaparin (LOVENOX) injection 40 mg  40 mg Subcutaneous Q24H Poggi, Marshall Cork, MD   40 mg at 06/03/19 0918  . HYDROmorphone (DILAUDID) injection 0.5-1 mg  0.5-1 mg Intravenous Q4H PRN Poggi, Marshall Cork, MD      . ipratropium-albuterol (DUONEB) 0.5-2.5 (3) MG/3ML nebulizer solution 3 mL  3 mL Nebulization Q4H PRN Poggi, Marshall Cork, MD      . ketorolac (TORADOL) 15 MG/ML injection 15 mg  15 mg Intravenous Q6H Poggi, Marshall Cork, MD   15 mg at 06/03/19 1316  . LORazepam (ATIVAN) tablet 1 mg  1 mg Oral BID Poggi, Marshall Cork, MD   1 mg at 06/03/19 0918  . magnesium hydroxide (MILK OF MAGNESIA) suspension 30 mL  30 mL Oral Daily PRN Poggi, Marshall Cork, MD      . metoCLOPramide (REGLAN)  tablet 5-10 mg  5-10 mg Oral Q8H PRN Poggi, Marshall Cork, MD       Or  . metoCLOPramide (REGLAN) injection 5-10 mg  5-10 mg Intravenous Q8H PRN Poggi, Marshall Cork, MD      . OLANZapine (ZYPREXA) tablet 15 mg  15 mg Oral Daily Poggi, Marshall Cork, MD   15 mg at 06/03/19 0917  . ondansetron (ZOFRAN) tablet 4 mg  4 mg Oral Q6H PRN Poggi, Marshall Cork, MD       Or  . ondansetron (ZOFRAN) injection 4 mg  4 mg Intravenous Q6H PRN Poggi, Marshall Cork, MD      . oxyCODONE (Oxy IR/ROXICODONE) immediate release tablet 5-10 mg  5-10 mg Oral Q4H PRN Poggi, Marshall Cork, MD      . paliperidone (INVEGA SUSTENNA) injection 234 mg  234 mg Intramuscular Once Dustin Flock, MD      . polyethylene glycol (MIRALAX / GLYCOLAX) packet 17 g  17 g Oral Daily PRN Poggi, Marshall Cork, MD      . propranolol (INDERAL) tablet 20 mg  20 mg Oral Daily Poggi, Marshall Cork, MD   20 mg at 06/03/19 0918  . senna (SENOKOT) tablet 8.6 mg  1 tablet Oral BID Poggi, Marshall Cork, MD   8.6 mg at 06/03/19 0918  . sertraline (ZOLOFT) tablet 100 mg  100 mg Oral BID Corky Mull, MD   100 mg at 06/03/19 0918  . sodium chloride flush (NS) 0.9 % injection 3 mL  3 mL Intravenous Q12H Poggi, Marshall Cork, MD   3 mL at 06/03/19 0947  . sodium phosphate (FLEET) 7-19 GM/118ML enema 1 enema  1 enema Rectal Once PRN Poggi, Marshall Cork, MD      . traMADol Veatrice Bourbon) tablet 50 mg  50 mg Oral Q6H PRN Poggi, Marshall Cork, MD      . vancomycin (VANCOCIN) IVPB 1000 mg/200 mL premix  1,000 mg Intravenous Q12H Poggi, Marshall Cork, MD 200 mL/hr at 06/03/19 1010 1,000 mg at 06/03/19 1010     Discharge Medications: Please see discharge summary for a list of discharge medications.  Relevant Imaging Results:  Relevant Lab Results:   Additional Information SSN: [id number]  Caelin Rayl, Veronia Beets, LCSW

## 2019-06-03 NOTE — Progress Notes (Signed)
PT Cancellation Note  Patient Details Name: Ethan Alvarez MRN: [id number] DOB: [birth year]   Cancelled Treatment:    Reason Eval/Treat Not Completed: Fatigue/lethargy limiting ability to participate(Sleeping soundly upon arrival.  Awakens briefly to touch, but unable to maintain alertness for participation with session.  Will continue efforts next date as medically appropriate and patient able to participate.)   [name] H. Owens Shark, PT, DPT, NCS 06/03/19, 2:52 PM [phone]

## 2019-06-03 NOTE — Progress Notes (Signed)
Subjective: 1 Day Post-Op Procedure(s) (LRB): REPAIR QUADRICEP TENDON (Right) INCISION AND DRAINAGE (Right) Patient reports pain as mild.   Patient is well, and has had no acute complaints or problems PT and care management to assist with discharge planning. Negative for chest pain and shortness of breath Fever: no Gastrointestinal:Negative for nausea and vomiting  Objective: Vital signs in last 24 hours: Temp:  [97.5 F (36.4 C)-98.4 F (36.9 C)] 97.5 F (36.4 C) (06/17 0748) Pulse Rate:  [77-92] 77 (06/17 0748) Resp:  [18-34] 19 (06/17 0748) BP: (114-146)/(65-96) 120/73 (06/17 0748) SpO2:  [91 %-100 %] 98 % (06/17 0748) Weight:  [118.3 kg] 118.3 kg (06/17 0500)  Intake/Output from previous day:  Intake/Output Summary (Last 24 hours) at 06/03/2019 0750 Last data filed at 06/03/2019 0617 Gross per 24 hour  Intake 2008.9 ml  Output 425 ml  Net 1583.9 ml    Intake/Output this shift: No intake/output data recorded.  Labs: Recent Labs    06/01/19 1513 06/02/19 0324 06/03/19 0642  HGB 13.7 13.2 13.2   Recent Labs    06/02/19 0324 06/03/19 0642  WBC 9.2 5.8  RBC 4.22 4.16*  HCT 39.6 40.6  PLT 83* 78*   Recent Labs    06/02/19 0324 06/03/19 0642  NA 132* 135  K 4.9 4.8  CL 98 102  CO2 25 24  BUN 10 14  CREATININE 0.90 0.75  GLUCOSE 132* 98  CALCIUM 8.8* 8.5*   No results for input(s): LABPT, INR in the last 72 hours.   EXAM General - Patient is Alert, Appropriate and Oriented Extremity - Sensation intact distally Intact pulses distally Dorsiflexion/Plantar flexion intact Incision: dressing C/D/I Dressing/Incision - clean, dry, no drainage, hemovac intact with moderate bloody drainage. Motor Function - intact, moving foot and toes well on exam.  Abdomen soft with normal BS.  Past Medical History:  Diagnosis Date  . Hypertension    Assessment/Plan: 1 Day Post-Op Procedure(s) (LRB): REPAIR QUADRICEP TENDON (Right) INCISION AND DRAINAGE  (Right) Active Problems:   Cellulitis of right knee  Estimated body mass index is 38.51 kg/m as calculated from the following:   Height as of this encounter: 5\' 9"  (1.753 m).   Weight as of this encounter: 118.3 kg. Advance diet Up with therapy D/C IV fluids when tolerating po intake.  Labs reviewed this AM, WBC 5.8. Hemovac intact, will remove tomorrow. Remain in knee immobilizer at this time, will transition to a long leg cast prior to discharge pending condition of the incision. Cultures from the wound are still pending at this time. Continue Vancomycin and Cefepime at this time.  DVT Prophylaxis - Lovenox, Foot Pumps and TED hose Weight-Bearing as tolerated to right leg in the knee [place], PA-C [facility] Orthopaedic Surgery 06/03/2019, 7:50 AM

## 2019-06-03 NOTE — Evaluation (Signed)
Physical Therapy Evaluation Patient Details Name: Ethan Alvarez MRN: [id number] DOB: 12/24/1960 Today's Date: 06/03/2019   History of Present Illness  Ethan Alvarez is a 58 y.o. male arrived at the ED per EMS 06/01/2019 from [facility] for shortness of breath, reports of confusion. Patient had a recent fall and was found to have ruptured his R quad tendon repair performed on 04/25/2019, and an infection of the wound. He was admitted to the hospital for deep infection s/p primary repair of R quad tendon rupture with recurrent R quad tendon rupture on 06/02/2019. He underwent irrigation and debridement with primary repair of recurrent R quadriceps tendon rupture on 06/02/2019. PMH includes schizophrenia, hypertension, history of repeated falls and quad tendon repair 04/25/2019. MRI R knee: Patellar tendon rupture and infection. RLE WBAT with knee immobilizer locked in extension, no ROM R knee    Clinical Impression  Patient alert and cooperative but confused and unable to provide an accurate history. According to documentation, [facility] after previous R quad tendon rupture. Unclear how patient was doing with mobility just  Prior to hospital admission, but he previously reported he was I with ambulation prior to his initial quadriceps rupture in early May 2019. Upon phsycial therapy evaluation, patient required mod A +2 for bed mobility, transfers, and ambulation. He required mod-max cuing for functional mobility and physical assistance for weight shifting when ambulating. Patient unable to remember to move R LE out when attempting to sit and required assistance to move R leg during transfers. Patient fatigued quickly but HR and SpO2 remained WFL with 2L/min O2 applied. Patient appears to have experienced a significant decline in funcitonal mobility and independence and would benefit from short term rehabilitation upon discharge to allow him to return home  safely. Patient would benefit from physical therapy to address  impairments and functional limitations (see PT problem list below) to work towards return to PLOF or maximal functional independence.      Follow Up Recommendations SNF;Supervision for mobility/OOB    Equipment Recommendations  Rolling walker with 5" wheels;3in1 (PT)    Recommendations for Other Services OT consult     Precautions / Restrictions Precautions Precautions: Knee Precaution Comments: RLE WBAT with R knee immobilizer applied; no ROM in R knee Restrictions Weight Bearing Restrictions: Yes RLE Weight Bearing: Weight bearing as tolerated Other Position/Activity Restrictions: with immobilizer applied, no ROM at R knee      Mobility  Bed Mobility Overal bed mobility: Needs Assistance Bed Mobility: Supine to Sit     Supine to sit: +2 for physical assistance;Max assist     General bed mobility comments: patient with difficulty initiating movement after agreeing to perform commands. Required support for trunk and legs.  Transfers Overall transfer level: Needs assistance Equipment used: Rolling walker (2 wheeled) Transfers: Sit to/from Stand Sit to Stand: +2 physical assistance;Mod assist;From elevated surface         General transfer comment: Patient completed sit <> stand transfer x 2 trials from elevated bed and armed chair with mod A +2 to help manage lines and leads and mantain R leg straight. Patient required step by step cuing.  Ambulation/Gait Ambulation/Gait assistance: +2 physical assistance;Mod assist Gait Distance (Feet): 5 Feet Assistive device: Rolling walker (2 wheeled) Gait Pattern/deviations: Wide base of support;Shuffle Gait velocity: decreased   General Gait Details: Patient required mod A +2 and use of RW to ambulate 5 steps. He required strong cuing and physical  assistance with weight shifting to take steps, shuffling 1-2 inches at a time. Improved with greater assistance to  weight shift. Required strong encouragment to attempt ambulation. Fatigued quickly.  Stairs            Wheelchair Mobility    Modified Rankin (Stroke Patients Only)       Balance Overall balance assessment: Needs assistance Sitting-balance support: Bilateral upper extremity supported;Feet supported Sitting balance-Leahy Scale: Fair Sitting balance - Comments: steady sitting at edge of bed with BUE support   Standing balance support: During functional activity Standing balance-Leahy Scale: Poor Standing balance comment: Patient dependent on RW for standing and requires CGA. Unable to weight shift without physical assist                             Pertinent Vitals/Pain Pain Assessment: Faces Faces Pain Scale: No hurt    Home Living Family/patient expects to be discharged to:: Skilled nursing facility Living Arrangements: Parent Available Help at Discharge: Family;Available PRN/intermittently;Personal care attendant Type of Home: House Home Access: Level entry     Home Layout: Multi-level;Full bath on main level;Able to live on main level with bedroom/bathroom;Other (Comment) Home Equipment: Other (comment)(unclear what DME he may have at this time.) Additional Comments: Patient is alert but disoriented to place, time, and situation. Per previous docmuntation he was transported to the hospital from SNF where he was receiving rehab just prior to this hospital admission. He demonstrates evidence of cognitive impairment consistent with developmental delay. He may not be a reliable historian. Patient previously reported he lives in 3 story home with his mother who requires his physical assistance for care. He lives in the basement, where he has a bathroom with a walk-in shower and a kitchinette. He states his mother lives on the main level. He reports he could stay on the main level where there is a bathroom with tub/shower combo and a place to sleep. He reports no  steps to enter the home, but 6 steps down to the basement and a third floor. He states his mother requires his physical assistance, but then reports she has caregivers that come to the home.    Prior Function           Comments: Per documentation he was receiving rehab at SNF prior to rehab and patient is unable to accurately describe how mobile he was there. At previous hospital admission, pt reported he was I with ambulation community distances.     Hand Dominance        Extremity/Trunk Assessment   Upper Extremity Assessment Upper Extremity Assessment: Generalized weakness    Lower Extremity Assessment Lower Extremity Assessment: Generalized weakness;RLE deficits/detail RLE Deficits / Details: R LE immobilized with knee imobilizer. RLE: Unable to fully assess due to immobilization    Cervical / Trunk Assessment Cervical / Trunk Assessment: Normal  Communication   Communication: Other (comment)(confused)  Cognition Arousal/Alertness: Awake/alert Behavior During Therapy: (requires repeated cuing to be able to follow directions. Demonstrated cogintive impairment and does not interpret situation appropriately. Cheerful and cooperative as able.) Overall Cognitive Status: No family/caregiver present to determine baseline cognitive functioning                                 General Comments: present as if he has developmental delay      General Comments      Exercises Other  Exercises Other Exercises: instructions and cuing in sit<>stand transfers to RW while maintaining precautions   Assessment/Plan    PT Assessment Patient needs continued PT services  PT Problem List Decreased strength;Decreased mobility;Decreased safety awareness;Decreased range of motion;Decreased coordination;Decreased knowledge of precautions;Obesity;Decreased activity tolerance;Decreased cognition;Cardiopulmonary status limiting activity;Decreased skin integrity;Decreased  balance;Decreased knowledge of use of DME;Pain       PT Treatment Interventions DME instruction;Therapeutic activities;Gait training;Therapeutic exercise;Patient/family education;Stair training;Balance training;Functional mobility training;Neuromuscular re-education;Manual techniques    PT Goals (Current goals can be found in the Care Plan section)  Acute Rehab PT Goals Patient Stated Goal: return home PT Goal Formulation: With patient Time For Goal Achievement: 06/17/19 Potential to Achieve Goals: Fair    Frequency BID   Barriers to discharge Decreased caregiver support;Inaccessible home environment      Co-evaluation               AM-PAC PT "6 Clicks" Mobility  Outcome Measure Help needed turning from your back to your side while in a flat bed without using bedrails?: A Lot Help needed moving from lying on your back to sitting on the side of a flat bed without using bedrails?: A Lot Help needed moving to and from a bed to a chair (including a wheelchair)?: A Lot Help needed standing up from a chair using your arms (e.g., wheelchair or bedside chair)?: A Little Help needed to walk in hospital room?: A Lot Help needed climbing 3-5 steps with a railing? : Total 6 Click Score: 12    End of Session Equipment Utilized During Treatment: Gait belt Activity Tolerance: Patient tolerated treatment well;Patient limited by fatigue Patient left: in chair;with chair alarm set;with call bell/phone within reach;with nursing/sitter in room Nurse Communication: Mobility status PT Visit Diagnosis: Unsteadiness on feet (R26.81);Muscle weakness (generalized) (M62.81);Difficulty in walking, not elsewhere classified (R26.2);Repeated falls (R29.6)    Time: 2841-3244 PT Time Calculation (min) (ACUTE ONLY): 30 min   Charges:   PT Evaluation $PT Eval Moderate Complexity: 1 Mod PT Treatments $Therapeutic Activity: 8-22 mins        Everlean Alstrom. Graylon Good, PT, DPT 06/03/19, 12:19 PM

## 2019-06-03 NOTE — TOC Initial Note (Signed)
Transition of Care [facility]) - Initial/Assessment Note    Patient Details  Name: Ethan Alvarez MRN: [id number] Date of Birth: 28-Jan-1961  Transition of Care [facility]) CM/SW Contact:    Nevaeha Finerty, [name] Phone Number: [phone]  06/03/2019, 2:26 PM  Clinical Narrative: Clinical Social Worker (CSW) received verbal consult from MD that patient will need SNF for PICC line and IV ABX. CSW met with patient alone at bedside to discuss D/C plan. Patient was alert and oriented to self and place. CSW introduced self and explained role of CSW department. Per patient he lives with his mother [name] in [place] and is agreeable to go to a SNF. CSW contacted patient's sister in law [name]. Per [name] patient has been at [facility] now since May 1st. Per [name] patient came to [facility] from [place]. [name] reported that she is not happy with patient's care at [facility] and wants another facility. FL2 complete and faxed out. [facility] is pending. Per [name] patient's brother [name] and patient's sister [name] share [place] for patient and are also working on guardianship. Per [name] patient's mother has dementia and they are trying to get patient into a group home after rehab. Per [name] they have applied for medicaid for patient. Per [name] patient and his mother have 24/7 caregivers at home in [place].            Expected Discharge Plan: Skilled Nursing Facility Barriers to Discharge: Continued Medical Work up   Patient Goals and CMS Choice Patient states their goals for this hospitalization and ongoing recovery are:: To improve mobility,   Choice offered to / list presented to : (Sister in Chief Financial Officer.)  Expected Discharge Plan and Services Expected Discharge Plan: [place] In-house Referral: Clinical Social Work Discharge Planning Services: CM Consult   Living arrangements for the past 2 months: [place], [place]                                      Prior Living Arrangements/Services Living arrangements for the past 2 months: [place], [place] Lives with:: Parents Patient language and need for interpreter reviewed:: No Do you feel safe going back to the place where you live?: Yes      Need for Family Participation in Patient Care: Yes (Comment) Care giver support system in place?: Yes (comment)   Criminal Activity/Legal Involvement Pertinent to Current Situation/Hospitalization: No - Comment as needed  Activities of Daily Living Home Assistive Devices/Equipment: Gilford Rile (specify type) ADL Screening (condition at time of admission) Patient's cognitive ability adequate to safely complete daily activities?: Yes Is the patient deaf or have difficulty hearing?: No Does the patient have difficulty seeing, even when wearing glasses/contacts?: No Does the patient have difficulty concentrating, remembering, or making decisions?: No Patient able to express need for assistance with ADLs?: Yes Does the patient have difficulty dressing or bathing?: Yes Independently performs ADLs?: Yes (appropriate for developmental age) Does the patient have difficulty walking or climbing stairs?: Yes Weakness of Legs: Both Weakness of Arms/Hands: None  Permission Sought/Granted Permission sought to share information with : Facility Sport and exercise psychologist, Family Supports Permission granted to share information with : Yes, Verbal Permission Granted              Emotional Assessment Appearance:: Appears stated age   Affect (typically observed): Accepting, Pleasant Orientation: : Oriented to Self, Oriented to  Place, Fluctuating Orientation (Suspected and/or reported Sundowners) Alcohol / Substance Use: Not Applicable Psych Involvement: No (comment)  Admission diagnosis:  Hypocalcemia [E83.51] Shortness of breath [R06.02] Altered mental status, unspecified altered mental status type [R41.82] Patient Active Problem  List   Diagnosis Date Noted  . Cellulitis of right knee 06/01/2019  . Rupture of right quadriceps tendon 04/25/2019   PCP:  Ezequiel Kayser, MD Pharmacy:   [facility], [address] [place] [address] Phone: [phone] Fax: [phone]     Social Determinants of Health (SDOH) Interventions    Readmission Risk Interventions Readmission Risk Prevention Plan 04/26/2019  Medication Screening Complete  Transportation Screening Complete  Some recent data might be hidden

## 2019-06-03 NOTE — Progress Notes (Signed)
[facility] [place] [facility]                                                                                                                                                                                  Patient Demographics   Ethan Alvarez, is a 58 y.o. male, DOB - 03/21/1961, ZOX:[id number]  Admit date - 06/01/2019   Admitting Physician Milagros LollSrikar Sudini, MD  Outpatient Primary MD for the patient is Mickey Farberhies, David, MD   LOS - 2  Subjective: Patient had his surgery yesterday  he had Irrigation and debridement with primary repair of recurrent right quadriceps tendon rupture.   Review of Systems:   CONSTITUTIONAL: No documented fever. No fatigue, weakness. No weight gain, no weight loss.  EYES: No blurry or double vision.  ENT: No tinnitus. No postnasal drip. No redness of the oropharynx.  RESPIRATORY: No cough, no wheeze, no hemoptysis. No dyspnea.  CARDIOVASCULAR: No chest pain. No orthopnea. No palpitations. No syncope.  GASTROINTESTINAL: No nausea, no vomiting or diarrhea. No abdominal pain. No melena or hematochezia.  GENITOURINARY: No dysuria or hematuria.  ENDOCRINE: No polyuria or nocturia. No heat or cold intolerance.  HEMATOLOGY: No anemia. No bruising. No bleeding.  INTEGUMENTARY: No rashes. No lesions.  MUSCULOSKELETAL: + pain in right leg NEUROLOGIC: No numbness, tingling, or ataxia. No seizure-type activity.  PSYCHIATRIC: No anxiety. No insomnia. No ADD.    Vitals:   Vitals:   06/02/19 2340 06/03/19 0500 06/03/19 0618 06/03/19 0748  BP: 126/77  129/73 120/73  Pulse: 80  77 77  Resp:   18 19  Temp: 97.7 F (36.5 C)  97.7 F (36.5 C) (!) 97.5 F (36.4 C)  TempSrc: Oral     SpO2: 97%  100% 98%  Weight:  118.3 kg    Height:        Wt Readings from Last 3 Encounters:  06/03/19 118.3 kg  04/26/19 130.4 kg     Intake/Output Summary (Last 24 hours) at 06/03/2019 1134 Last data filed at 06/03/2019 0947 Gross per 24 hour  Intake 2111.9 ml   Output 425 ml  Net 1686.9 ml    Physical Exam:   GENERAL: Pleasant-appearing in no apparent distress.  HEAD, EYES, EARS, NOSE AND THROAT: Atraumatic, normocephalic. Extraocular muscles are intact. Pupils equal and reactive to light. Sclerae anicteric. No conjunctival injection. No oro-pharyngeal erythema.  NECK: Supple. There is no jugular venous distention. No bruits, no lymphadenopathy, no thyromegaly.  HEART: Regular rate and rhythm,. No murmurs, no rubs, no clicks.  LUNGS: Clear to auscultation bilaterally. No rales or rhonchi. No wheezes.  ABDOMEN: Soft, flat, nontender, nondistended. Has good bowel sounds. No hepatosplenomegaly appreciated.  EXTREMITIES: No evidence of any cyanosis, clubbing, or peripheral edema.  +2 pedal and radial pulses bilaterally.  Has a brace placed on the right knee by orthopedics earlier today Right lower extermity in brace  NEUROLOGIC: The patient is alert, awake, and oriented x3 with no focal motor or sensory deficits appreciated bilaterally.  SKIN: Moist and warm with no rashes appreciated.  Psych: Not anxious, depressed LN: No inguinal LN enlargement    Antibiotics   Anti-infectives (From admission, onward)   Start     Dose/Rate Route Frequency Ordered Stop   06/02/19 2015  vancomycin (VANCOCIN) IVPB 1000 mg/200 mL premix     1,000 mg 200 mL/hr over 60 Minutes Intravenous Every 12 hours 06/02/19 2007     06/02/19 1000  vancomycin (VANCOCIN) 1,500 mg in sodium chloride 0.9 % 500 mL IVPB  Status:  Discontinued     1,500 mg 250 mL/hr over 120 Minutes Intravenous Every 12 hours 06/01/19 1758 06/01/19 2328   06/02/19 1000  vancomycin (VANCOCIN) 1,500 mg in sodium chloride 0.9 % 500 mL IVPB  Status:  Discontinued     1,500 mg 250 mL/hr over 120 Minutes Intravenous Every 12 hours 06/01/19 2328 06/02/19 1032   06/01/19 1800  vancomycin (VANCOCIN) 2,500 mg in sodium chloride 0.9 % 500 mL IVPB     2,500 mg 250 mL/hr over 120 Minutes Intravenous  Once  06/01/19 1753 06/01/19 2227   06/01/19 1800  ceFEPIme (MAXIPIME) 2 g in sodium chloride 0.9 % 100 mL IVPB     2 g 200 mL/hr over 30 Minutes Intravenous Every 8 hours 06/01/19 1754        Medications   Scheduled Meds: . acetaminophen  1,000 mg Oral Q6H  . benztropine  0.5 mg Oral BH-q7a  . benztropine  1 mg Oral BID  . divalproex  1,000 mg Oral BID  . docusate sodium  100 mg Oral BID  . enoxaparin (LOVENOX) injection  40 mg Subcutaneous Q24H  . ketorolac  15 mg Intravenous Q6H  . LORazepam  1 mg Oral BID  . OLANZapine  15 mg Oral Daily  . paliperidone  234 mg Intramuscular Once  . propranolol  20 mg Oral Daily  . senna  1 tablet Oral BID  . sertraline  100 mg Oral BID  . sodium chloride flush  3 mL Intravenous Q12H   Continuous Infusions: . sodium chloride 75 mL/hr at 06/03/19 1007  . ceFEPime (MAXIPIME) IV Stopped (06/03/19 0603)  . vancomycin 1,000 mg (06/03/19 1010)   PRN Meds:.acetaminophen, bisacodyl, clonazePAM, diphenhydrAMINE, HYDROmorphone (DILAUDID) injection, ipratropium-albuterol, magnesium hydroxide, metoCLOPramide **OR** metoCLOPramide (REGLAN) injection, ondansetron **OR** ondansetron (ZOFRAN) IV, oxyCODONE, polyethylene glycol, sodium phosphate, traMADol   Data Review:   Micro Results Recent Results (from the past 240 hour(s))  Blood culture (routine x 2)     Status: Abnormal (Preliminary result)   Collection Time: 06/01/19  3:45 PM   Specimen: BLOOD  Result Value Ref Range Status   Specimen Description   Final    BLOOD LEFT ANTECUBITAL Performed at [facility], [address] [place], [place] [address]    Special Requests   Final    BOTTLES DRAWN AEROBIC AND ANAEROBIC Blood Culture results may not be optimal due to an excessive volume of blood received in culture bottles Performed at [facility], [address] [place], [place] [address]    Culture  Setup Time   Final    GRAM POSITIVE COCCI ANAEROBIC BOTTLE ONLY CRITICAL  RESULT  CALLED TO, READ BACK BY AND VERIFIED WITH: Rayna Sexton AT 1000 06/02/2019 SDR Performed at [facility], [place] [address], [place], [place] [address]    Culture STAPHYLOCOCCUS AUREUS (A)  Final   Report Status PENDING  Incomplete  Blood Culture ID Panel (Reflexed)     Status: Abnormal   Collection Time: 06/01/19  3:45 PM  Result Value Ref Range Status   Enterococcus species NOT DETECTED NOT DETECTED Final   Listeria monocytogenes NOT DETECTED NOT DETECTED Final   Staphylococcus species DETECTED (A) NOT DETECTED Final    Comment: CRITICAL RESULT CALLED TO, READ BACK BY AND VERIFIED WITH:  Rayna Sexton AT 1000 06/02/2019 SDR    Staphylococcus aureus (BCID) DETECTED (A) NOT DETECTED Final    Comment: Methicillin (oxacillin) susceptible Staphylococcus aureus (MSSA). Preferred therapy is anti staphylococcal beta lactam antibiotic (Cefazolin or Nafcillin), unless clinically contraindicated. CRITICAL RESULT CALLED TO, READ BACK BY AND VERIFIED WITH:  Rayna Sexton AT 1000 06/02/2019 SDR    Methicillin resistance NOT DETECTED NOT DETECTED Final   Streptococcus species NOT DETECTED NOT DETECTED Final   Streptococcus agalactiae NOT DETECTED NOT DETECTED Final   Streptococcus pneumoniae NOT DETECTED NOT DETECTED Final   Streptococcus pyogenes NOT DETECTED NOT DETECTED Final   Acinetobacter baumannii NOT DETECTED NOT DETECTED Final   Enterobacteriaceae species NOT DETECTED NOT DETECTED Final   Enterobacter cloacae complex NOT DETECTED NOT DETECTED Final   Escherichia coli NOT DETECTED NOT DETECTED Final   Klebsiella oxytoca NOT DETECTED NOT DETECTED Final   Klebsiella pneumoniae NOT DETECTED NOT DETECTED Final   Proteus species NOT DETECTED NOT DETECTED Final   Serratia marcescens NOT DETECTED NOT DETECTED Final   Haemophilus influenzae NOT DETECTED NOT DETECTED Final   Neisseria meningitidis NOT DETECTED NOT DETECTED Final   Pseudomonas aeruginosa NOT DETECTED NOT DETECTED Final    Candida albicans NOT DETECTED NOT DETECTED Final   Candida glabrata NOT DETECTED NOT DETECTED Final   Candida krusei NOT DETECTED NOT DETECTED Final   Candida parapsilosis NOT DETECTED NOT DETECTED Final   Candida tropicalis NOT DETECTED NOT DETECTED Final    Comment: Performed at [facility], [address], [place], [place] [address]  SARS Coronavirus 2 (CEPHEID- Performed in [place] hospital lab), Hosp Order     Status: None   Collection Time: 06/01/19  5:47 PM   Specimen: Nasopharyngeal Swab  Result Value Ref Range Status   SARS Coronavirus 2 NEGATIVE NEGATIVE Final    Comment: (NOTE) If result is NEGATIVE SARS-CoV-2 target nucleic acids are NOT DETECTED. The SARS-CoV-2 RNA is generally detectable in upper and lower  respiratory specimens during the acute phase of infection. The lowest  concentration of SARS-CoV-2 viral copies this assay can detect is 250  copies / mL. A negative result does not preclude SARS-CoV-2 infection  and should not be used as the sole basis for treatment or other  patient management decisions.  A negative result may occur with  improper specimen collection / handling, submission of specimen other  than nasopharyngeal swab, presence of viral mutation(s) within the  areas targeted by this assay, and inadequate number of viral copies  (<250 copies / mL). A negative result must be combined with clinical  observations, patient history, and epidemiological information. If result is POSITIVE SARS-CoV-2 target nucleic acids are DETECTED. The SARS-CoV-2 RNA is generally detectable in upper and lower  respiratory specimens dur ing the acute phase of infection.  Positive  results are indicative of active infection  with SARS-CoV-2.  Clinical  correlation with patient history and other diagnostic information is  necessary to determine patient infection status.  Positive results do  not rule out bacterial infection or co-infection with other  viruses. If result is PRESUMPTIVE POSTIVE SARS-CoV-2 nucleic acids MAY BE PRESENT.   A presumptive positive result was obtained on the submitted specimen  and confirmed on repeat testing.  While 2019 novel coronavirus  (SARS-CoV-2) nucleic acids may be present in the submitted sample  additional confirmatory testing may be necessary for epidemiological  and / or clinical management purposes  to differentiate between  SARS-CoV-2 and other Sarbecovirus currently known to infect humans.  If clinically indicated additional testing with an alternate test  methodology [phone]) is advised. The SARS-CoV-2 RNA is generally  detectable in upper and lower respiratory sp ecimens during the acute  phase of infection. The expected result is Negative. Fact Sheet for Patients:  BoilerBrush.com.cy Fact Sheet for Healthcare Providers: https://pope.com/ This test is not yet approved or cleared by the [place] FDA and has been authorized for detection and/or diagnosis of SARS-CoV-2 by FDA under an Emergency Use Authorization (EUA).  This EUA will remain in effect (meaning this test can be used) for the duration of the COVID-19 declaration under Section 564(b)(1) of the Act, 21 U.S.C. section 360bbb-3(b)(1), unless the authorization is terminated or revoked sooner. Performed at [facility], [address] [place], [place] [address]   Blood culture (routine x 2)     Status: None (Preliminary result)   Collection Time: 06/01/19 10:17 PM   Specimen: BLOOD  Result Value Ref Range Status   Specimen Description BLOOD BLOOD RIGHT HAND  Final   Special Requests   Final    BOTTLES DRAWN AEROBIC AND ANAEROBIC Blood Culture adequate volume   Culture   Final    NO GROWTH 2 DAYS Performed at [facility], [address], [place], [place] [address]    Report Status PENDING  Incomplete  Aerobic/Anaerobic Culture (surgical/deep wound)      Status: None (Preliminary result)   Collection Time: 06/02/19  4:03 PM   Specimen: Wound  Result Value Ref Range Status   Specimen Description   Final    WOUND Performed at [facility], [address] [place], [place] [address]    Special Requests RIGHT KNEE JOINT  Final   Gram Stain NO WBC SEEN NO ORGANISMS SEEN   Final   Culture   Final    TOO YOUNG TO READ Performed at [facility], [address], [place], [place] [address]    Report Status PENDING  Incomplete    Radiology Reports Ct Angio Chest Pe W And/or Wo Contrast  Result Date: 06/01/2019 CLINICAL DATA:  Short of breath EXAM: CT ANGIOGRAPHY CHEST WITH CONTRAST TECHNIQUE: Multidetector CT imaging of the chest was performed using the standard protocol during bolus administration of intravenous contrast. Multiplanar CT image reconstructions and MIPs were obtained to evaluate the vascular anatomy. CONTRAST:  75mL OMNIPAQUE IOHEXOL 350 MG/ML SOLN COMPARISON:  06/01/2019, CT angio chest 04/27/2019 FINDINGS: Cardiovascular: Adequate vascular enhancement. Image quality degraded by moderate motion. Negative for pulmonary embolism. Pulmonary arteries normal in caliber. Normal aortic arch. Heart size normal. No significant coronary calcification. Mediastinum/Nodes: Negative for mass or adenopathy. Lungs/Pleura: Elevated right hemidiaphragm with bibasilar atelectasis. Negative for pneumonia or effusion. Upper Abdomen: Hepatosplenomegaly without mass. Small hiatal hernia. Musculoskeletal: Multiple Schmorl's nodes throughout the thoracic spine. Review of the MIP images confirms the above  findings. IMPRESSION: Negative for pulmonary embolism Bibasilar atelectasis. Hepatosplenomegaly Electronically Signed   By: Marlan Palauharles  Clark M.D.   On: 06/01/2019 18:21   Mr Knee Right Wo Contrast  Result Date: 06/02/2019 CLINICAL DATA:  Patient status post right quadriceps tendon repair 04/25/2019 after he tore the tendon when he tripped on  stairs 04/24/2019. Right knee pain, swelling, erythema and wound dehiscence. Subsequent encounter. EXAM: MRI OF THE RIGHT KNEE WITHOUT CONTRAST TECHNIQUE: Multiplanar, multisequence MR imaging of the knee was performed. No intravenous contrast was administered. COMPARISON:  Plain films right knee 06/01/2019 and 04/25/2019. CT right knee 04/25/2019. FINDINGS: MENISCI Medial meniscus:  Intact. Lateral meniscus:  Intact. LIGAMENTS Cruciates:  Intact. Collaterals:  Intact. CARTILAGE Patellofemoral:  Preserved. Medial:  Preserved. Lateral:  Preserved. Joint:  Large joint effusion with septations and debris is present. Popliteal Fossa:  No Baker's cyst. Extensor Mechanism: The patient is status post repair of his quadriceps tendon. The tendon is completely ruptured and superiorly retracted approximately 4 cm. The extensor mechanism is otherwise intact. Bones: There is intense edema throughout the patella. Mild artifact from the patient's anchors for quadriceps tendon repair noted. Other: Extensive subcutaneous edema is present about the anterior aspect of the knee. IMPRESSION: Complete rupture of the patellar tendon with 4 cm of retraction. Large joint effusion containing debris worrisome for septic effusion. Edema throughout the patella most consistent with osteomyelitis given extensive overlying cellulitis. Electronically Signed   By: Drusilla Kannerhomas  Dalessio M.D.   On: 06/02/2019 12:17   Dg Chest Portable 1 View  Result Date: 06/01/2019 CLINICAL DATA:  Hypertension EXAM: PORTABLE CHEST 1 VIEW COMPARISON:  04/27/2019 FINDINGS: The heart size is mildly enlarged but stable. There is bibasilar atelectasis. No pneumothorax. No large pleural effusion. No acute osseous abnormality. IMPRESSION: No active disease. Electronically Signed   By: Katherine Mantlehristopher  Green M.D.   On: 06/01/2019 15:45   Dg Knee Complete 4 Views Right  Result Date: 06/01/2019 CLINICAL DATA:  Right knee pain. EXAM: RIGHT KNEE - COMPLETE 4+ VIEW COMPARISON:   04/25/2019 FINDINGS: No evidence of fracture, or dislocation. Postsurgical changes from quadriceps tendon repair. Suprapatellar soft tissue swelling. No evidence of arthropathy . IMPRESSION: 1. No acute fracture or dislocation identified about the right knee. 2. Suprapatellar soft tissue swelling in the region of the quadriceps tendon repair and associated subcutaneous edema. Infectious etiology cannot be excluded. Electronically Signed   By: Ted Mcalpineobrinka  Dimitrova M.D.   On: 06/01/2019 16:10     CBC Recent Labs  Lab 06/01/19 1513 06/02/19 0324 06/03/19 0642  WBC 9.2 9.2 5.8  HGB 13.7 13.2 13.2  HCT 41.1 39.6 40.6  PLT 83* 83* 78*  MCV 95.4 93.8 97.6  MCH 31.8 31.3 31.7  MCHC 33.3 33.3 32.5  RDW 13.4 13.2 13.0  LYMPHSABS 0.8  --   --   MONOABS 1.5*  --   --   EOSABS 0.1  --   --   BASOSABS 0.0  --   --     Chemistries  Recent Labs  Lab 06/01/19 1513 06/01/19 2218 06/02/19 0324 06/03/19 0642  NA 137  --  132* 135  K 3.1*  --  4.9 4.8  CL 110  --  98 102  CO2 19*  --  25 24  GLUCOSE 95  --  132* 98  BUN 9  --  10 14  CREATININE 0.59*  --  0.90 0.75  CALCIUM 6.0*  --  8.8* 8.5*  MG  --  2.0  --   --  AST 15  --   --   --   ALT 13  --   --   --   ALKPHOS 39  --   --   --   BILITOT 0.7  --   --   --    ------------------------------------------------------------------------------------------------------------------ estimated creatinine clearance is 130.8 mL/min (by C-G formula based on SCr of 0.75 mg/dL). ------------------------------------------------------------------------------------------------------------------ No results for input(s): HGBA1C in the last 72 hours. ------------------------------------------------------------------------------------------------------------------ No results for input(s): CHOL, HDL, LDLCALC, TRIG, CHOLHDL, LDLDIRECT in the last 72  hours. ------------------------------------------------------------------------------------------------------------------ No results for input(s): TSH, T4TOTAL, T3FREE, THYROIDAB in the last 72 hours.  Invalid input(s): FREET3 ------------------------------------------------------------------------------------------------------------------ No results for input(s): VITAMINB12, FOLATE, FERRITIN, TIBC, IRON, RETICCTPCT in the last 72 hours.  Coagulation profile No results for input(s): INR, PROTIME in the last 168 hours.  No results for input(s): DDIMER in the last 72 hours.  Cardiac Enzymes Recent Labs  Lab 06/01/19 1513  TROPONINI <0.03   ------------------------------------------------------------------------------------------------------------------ Invalid input(s): POCBNP    Assessment & Plan  Patient is 58 year old presenting with right knee quadricepts tendon rupture with deep infection    *Right knee cellulitis with septic joint-  Patient underwent irrigation and debridement with primary repair of recurrent right quadricep tendon rupture He has positive blood culture for staph aureus, has been seen by infectious disease continue ceftezole and   *Shortness of breath.  No clear etiology.  Patient's lungs sound clear.  His CT of the chest is clear.  And he has had similar symptoms during recent admission thought to be due to anxiety.     I will check echocardiogram of the heart, discussed the case with sister-in-law who states that patient also has been complaining of intermittent chest pain Patient likely has sleep apnea   *Hypocalcemia.  Corrected calcium is still low.    PTH and vitamin D levels are normal I will place him on oral calcium supplement  *Schizophrenia.  Continue Cogentin, Klonopin, Depakote, Zyprexa, Zoloft  Discussed with patient's sister-in-law he is due for his Hinda Glatternvega today we will give him a dose  *Hypertension.  Blood pressure stable Continue  Inderal  *DVT prophylaxis with Lovenox  *Disposition patient is from a facility he will need to go back there     Code Status Orders  (From admission, onward)         Start     Ordered   06/01/19 2027  Full code  Continuous     06/01/19 2031        Code Status History    Date Active Date Inactive Code Status Order ID Comments User Context   04/25/2019 1826 04/28/2019 2206 Full Code [id number]  Poggi, Excell SeltzerJohn J, MD Inpatient   Advance Care Planning Activity    Advance Directive Documentation     Most Recent Value  Type of Advance Directive  Healthcare Power of Attorney  Pre-existing out of facility DNR order (yellow form or pink MOST form)  -  "MOST" Form in Place?  -           Consults orhtopedics  DVT Prophylaxis  Lovenox   Lab Results  Component Value Date   PLT 78 (L) 06/03/2019     Time Spent in minutes 35min Greater than 50% of time spent in care coordination and counseling patient regarding the condition and plan of care.   Auburn BilberryShreyang Calieb Lichtman M.D on 06/03/2019 at 11:34 AM  Between 7am to 6pm - Pager - [phone]  After 6pm go to [email or web address] - password  [employer]  [phone]

## 2019-06-03 NOTE — Plan of Care (Signed)

## 2019-06-04 LAB — CBC
HCT: 36 % — ABNORMAL LOW (ref 39.0–52.0)
Hemoglobin: 11.6 g/dL — ABNORMAL LOW (ref 13.0–17.0)
MCH: 31.6 pg (ref 26.0–34.0)
MCHC: 32.2 g/dL (ref 30.0–36.0)
MCV: 98.1 fL (ref 80.0–100.0)
Platelets: 99 10*3/uL — ABNORMAL LOW (ref 150–400)
RBC: 3.67 MIL/uL — ABNORMAL LOW (ref 4.22–5.81)
RDW: 13.2 % (ref 11.5–15.5)
WBC: 4.3 10*3/uL (ref 4.0–10.5)
nRBC: 0 % (ref 0.0–0.2)

## 2019-06-04 LAB — BASIC METABOLIC PANEL
Anion gap: 9 (ref 5–15)
BUN: 11 mg/dL (ref 6–20)
CO2: 27 mmol/L (ref 22–32)
Calcium: 8.3 mg/dL — ABNORMAL LOW (ref 8.9–10.3)
Chloride: 101 mmol/L (ref 98–111)
Creatinine, Ser: 0.7 mg/dL (ref 0.61–1.24)
GFR calc Af Amer: >60 mL/min (ref >60)
GFR calc non Af Amer: >60 mL/min (ref >60)
Glucose, Bld: 124 mg/dL — ABNORMAL HIGH (ref 70–99)
Potassium: 4 mmol/L (ref 3.5–5.1)
Sodium: 137 mmol/L (ref 135–145)

## 2019-06-04 LAB — ECHOCARDIOGRAM COMPLETE
Height: 69 in
Weight: 4172.87 oz

## 2019-06-04 MED ORDER — POLYETHYLENE GLYCOL 3350 17 G PO PACK
17.0000 g | PACK | Freq: Every day | ORAL | Status: DC
  Administered 2019-06-05 – 2019-06-09 (×3): 17 g via ORAL
  Filled 2019-06-04 (×5): qty 1

## 2019-06-04 NOTE — Progress Notes (Addendum)
PT Cancellation Note  Patient Details Name: Ethan Alvarez MRN: [id number] DOB: Jan 30, 1961   Cancelled Treatment:    Reason Eval/Treat Not Completed: Fatigue/lethargy limiting ability to participate   Pt remains sound asleep and unable to awaken for participation for activity.  Pt does not assist with LE AAROM despite verbal and tactile cues.    Discussed with primary PT.  Pt originally BID.  Will decrease frequency to QD which is more appropriate at this time.   Chesley Noon 06/04/2019, 12:05 PM

## 2019-06-04 NOTE — Progress Notes (Signed)
Rehab Admissions Coordinator Note:  Per SW request, this patient was screened by Jhonnie Garner for appropriateness for an Inpatient Acute Rehab Consult.  At this time, we are recommending [facility] as pt has not shown adequate evidence that he can participate in 3hr/day of therapy. From chart review it appears that he does not have 24/7 A at home to support a short CIR stay.   Jhonnie Garner 06/04/2019, 2:42 PM  I can be reached at [phone].

## 2019-06-04 NOTE — TOC Progression Note (Signed)
Transition of Care [facility]) - Progression Note    Patient Details  Name: CORNEL WERBER MRN: [id number] Date of Birth: 1961-12-04  [facility] of [facility] CM/SW Contact  Aquila Menzie, [name] Phone Number: [phone]  06/04/2019, 4:16 PM  Clinical Narrative: [name] is still pending. Clinical Education officer, museum (CSW) faxed requested clinicals to [facility]. Per patient's sister in law [name] they may want patient to return to [facility]). Per [facility] admissions coordinator at inpatient rehab at [facility] they can't accept patient. Per [facility] admissions coordinator at [place] they have received [facility] SNF authorization. CSW will continue to follow and assist as needed.     Expected Discharge Plan: [place] Barriers to Discharge: Continued Medical Work up  Expected Discharge Plan and Services Expected Discharge Plan: [place] In-house Referral: Clinical Social Work Discharge Planning Services: CM Consult   Living arrangements for the past 2 months: [place], [place]                                       Social Determinants of Health (SDOH) Interventions    Readmission Risk Interventions Readmission Risk Prevention Plan 04/26/2019  Medication Screening Complete  Transportation Screening Complete  Some recent data might be hidden

## 2019-06-04 NOTE — Care Management Important Message (Signed)
Important Message  Patient Details  Name: Ethan Alvarez MRN: [id number] Date of Birth: 1961/12/01   Medicare Important Message Given:  Yes    Juliann Pulse A Dnya Hickle 06/04/2019, 11:48 AM

## 2019-06-04 NOTE — TOC Progression Note (Addendum)
Transition of Care [facility]) - Progression Note    Patient Details  Name: Ethan Alvarez MRN: [id number] Date of Birth: 29-Apr-1961  Transition of Care [facility]) CM/SW Contact  Nyrie Sigal, [name] Phone Number: [phone] 06/04/2019, 10:28 AM  Clinical Narrative: Clinical Social Worker (CSW) contacted patient's sister in [employer] [place] and presented SNF bed offers [place] [facility] Per [name] she will discuss the offers with patient's family and will get back with CSW. PASARR is pending.   CSW contacted [name] and made her aware that [facility] also made a bed offer. [name] requested that patient go to inpatient rehab at [facility]. CSW explained to [facility] that patient likely does not meet criteria for inpatient rehab however CSW will call inpatient rehab and ask them to review the chart. CSW called [facility] admissions coordinator at inpatient rehab at [facility] and asked her to review chart.    Expected Discharge Plan: [place] Barriers to Discharge: Continued Medical Work up  Expected Discharge Plan and Services Expected Discharge Plan: [place] In-house Referral: Clinical Social Work Discharge Planning Services: CM Consult   Living arrangements for the past 2 months: [place], [place]                                       Social Determinants of Health (SDOH) Interventions    Readmission Risk Interventions Readmission Risk Prevention Plan 04/26/2019  Medication Screening Complete  Transportation Screening Complete  Some recent data might be hidden

## 2019-06-04 NOTE — Progress Notes (Signed)
Subjective: 2 Days Post-Op Procedure(s) (LRB): REPAIR QUADRICEP TENDON (Right) INCISION AND DRAINAGE (Right) Patient reports pain as mild.   Patient is well, and has had no acute complaints or problems PT and care management to assist with discharge planning. Negative for chest pain and shortness of breath Fever: no Gastrointestinal:Negative for nausea and vomiting  Objective: Vital signs in last 24 hours: Temp:  [97.4 F (36.3 C)-98.5 F (36.9 C)] 97.7 F (36.5 C) (06/18 1540) Pulse Rate:  [87-95] 88 (06/18 1540) Resp:  [18-20] 18 (06/18 1540) BP: (125-130)/(66-90) 125/90 (06/18 1540) SpO2:  [97 %-100 %] 99 % (06/18 1540) Weight:  [952 kg] 119 kg (06/18 0500)  Intake/Output from previous day:  Intake/Output Summary (Last 24 hours) at 06/04/2019 1614 Last data filed at 06/04/2019 1555 Gross per 24 hour  Intake 2853.93 ml  Output 3235 ml  Net -381.07 ml    Intake/Output this shift: Total I/O In: 1327.3 [P.O.:240; I.V.:887.3; IV Piggyback:200] Out: 1700 [Urine:1700]  Labs: Recent Labs    06/02/19 0324 06/03/19 0642 06/04/19 0017  HGB 13.2 13.2 11.6*   Recent Labs    06/03/19 0642 06/04/19 0017  WBC 5.8 4.3  RBC 4.16* 3.67*  HCT 40.6 36.0*  PLT 78* 99*   Recent Labs    06/03/19 0642 06/04/19 0017  NA 135 137  K 4.8 4.0  CL 102 101  CO2 24 27  BUN 14 11  CREATININE 0.75 0.70  GLUCOSE 98 124*  CALCIUM 8.5* 8.3*   No results for input(s): LABPT, INR in the last 72 hours.   EXAM General - Patient is Alert, Appropriate and Oriented.  Patient very drowsy this morning but after conversation he did seem more talkative and was able to answer my questions. Extremity - Sensation intact distally Intact pulses distally Dorsiflexion/Plantar flexion intact Incision: dressing C/D/I Dressing/Incision - Hemovac was removed today, still mild erythema over the quad tendon insertion, no purulent drainage noted to the right quad incision. Motor Function - intact,  moving foot and toes well on exam.  Abdomen soft with normal BS.  Past Medical History:  Diagnosis Date  . Hypertension    Assessment/Plan: 2 Days Post-Op Procedure(s) (LRB): REPAIR QUADRICEP TENDON (Right) INCISION AND DRAINAGE (Right) Active Problems:   Cellulitis of right knee  Estimated body mass index is 38.74 kg/m as calculated from the following:   Height as of this encounter: 5\' 9"  (1.753 m).   Weight as of this encounter: 119 kg. Advance diet Up with therapy D/C IV fluids when tolerating po intake.  Labs reviewed this AM, WBC 5.8. Hemovac removed today, new dressing applied to the right knee. Culture from surgery demonstrated staph aureus Remain in knee immobilizer at this time, will transition to a long leg cast prior to discharge pending condition of the incision. Continue Ancef at this time.  DVT Prophylaxis - Lovenox, Foot Pumps and TED hose Weight-Bearing as tolerated to right leg in the knee [place], PA-C [facility] 06/04/2019, 4:14 PM

## 2019-06-04 NOTE — Progress Notes (Signed)
[employer] - [place] at [facility]     PATIENT NAME: Ethan Alvarez    MR#:  [id number]  DATE OF BIRTH:  05/13/1961  SUBJECTIVE:   No acute events overnight.  Patient is status post right knee irrigation and debridement and primary repair of the quadriceps tendon postop day #2 today.   REVIEW OF SYSTEMS:    Review of Systems  Constitutional: Negative for chills and fever.  HENT: Negative for congestion and tinnitus.   Eyes: Negative for blurred vision and double vision.  Respiratory: Negative for cough, shortness of breath and wheezing.   Cardiovascular: Negative for chest pain, orthopnea and PND.  Gastrointestinal: Negative for abdominal pain, diarrhea, nausea and vomiting.  Genitourinary: Negative for dysuria and hematuria.  Musculoskeletal: Positive for joint pain (Right knee pain. ).  Neurological: Positive for weakness (globally weak. ). Negative for dizziness, sensory change and focal weakness.  All other systems reviewed and are negative.   Nutrition: Carb control diet Tolerating Diet: Yes Tolerating PT: await Eval.   DRUG ALLERGIES:   Allergies  Allergen Reactions  . Haloperidol Lactate Other (See Comments)    Per [facility]; nature of intolerance unclear intolerance Other reaction(s): Other (See Comments) Per [facility]; nature of intolerance unclear     VITALS:  Blood pressure 129/74, pulse 95, temperature 98.5 F (36.9 C), temperature source Oral, resp. rate 18, height 5\' 9"  (1.753 m), weight 119 kg, SpO2 97 %.  PHYSICAL EXAMINATION:   Physical Exam  GENERAL:  58 y.o.-year-old patient lying in bed in no acute distress.  EYES: Pupils equal, round, reactive to light and accommodation. No scleral icterus. Extraocular muscles intact.  HEENT: Head atraumatic, normocephalic. Oropharynx and nasopharynx clear.  NECK:  Supple, no jugular venous distention. No thyroid enlargement, no tenderness.  LUNGS: Normal breath sounds bilaterally, no wheezing, rales,  rhonchi. No use of accessory muscles of respiration.  CARDIOVASCULAR: S1, S2 normal. No murmurs, rubs, or gallops.  ABDOMEN: Soft, nontender, nondistended. Bowel sounds present. No organomegaly or mass.  EXTREMITIES: No cyanosis, clubbing or edema b/l.  Right knee dressing in place from I & D.    NEUROLOGIC: Cranial nerves II through XII are intact. No focal Motor or sensory deficits b/l.  Globally weak.    PSYCHIATRIC: The patient is alert and oriented x 3.  SKIN: No obvious rash, lesion, or ulcer.    LABORATORY PANEL:   CBC Recent Labs  Lab 06/04/19 0017  WBC 4.3  HGB 11.6*  HCT 36.0*  PLT 99*   ------------------------------------------------------------------------------------------------------------------  Chemistries  Recent Labs  Lab 06/01/19 1513 06/01/19 2218  06/04/19 0017  NA 137  --    < > 137  K 3.1*  --    < > 4.0  CL 110  --    < > 101  CO2 19*  --    < > 27  GLUCOSE 95  --    < > 124*  BUN 9  --    < > 11  CREATININE 0.59*  --    < > 0.70  CALCIUM 6.0*  --    < > 8.3*  MG  --  2.0  --   --   AST 15  --   --   --   ALT 13  --   --   --   ALKPHOS 39  --   --   --   BILITOT 0.7  --   --   --    < > =  values in this interval not displayed.   ------------------------------------------------------------------------------------------------------------------  Cardiac Enzymes Recent Labs  Lab 06/01/19 1513  TROPONINI <0.03   ------------------------------------------------------------------------------------------------------------------  RADIOLOGY:  No results found.   ASSESSMENT AND PLAN:   58 yo male with past medical history of hypertension, schizophrenia, recent hospitalization for a fall and right quadriceps tendon rupture which was repaired and discharged return back due to shortness of breath.  Patient during this hospitalization was noted to have sepsis secondary to septic arthritis of the right knee.  1.  Right knee cellulitis with septic  arthritis-this is a source of patient's sepsis as mentioned. - Patient's blood cultures are positive for staph aureus.  Seen by infectious disease and also by orthopedic surgery. -Patient is status post irrigation and debridement of the right knee and also primary repair of the quadriceps tendon rupture. - Continue pain control and further care as per orthopedics. - aWait further ID input, repeat BC are (-) so far.  - cont. IV ancef for now.   2.  Shortness of breath-etiology unclear presently. - Suspected to be secondary to underlying sleep apnea.  Patient is clinically not hypoxic.  Not in CHF or any focal pneumonia on CT chest.  Follow clinically.  3.  History of schizophrenia-continue Depakote, Zyprexa, Cogentin. - s/p Invega injection yesterday.   4.  Essential hypertension-continue propranolol.  5.  Depression-continue Zoloft.    All the records are reviewed and case discussed with Care Management/Social Worker. Management plans discussed with the patient, family and they are in agreement.  CODE STATUS: Full code  DVT Prophylaxis: Lovenox  TOTAL TIME TAKING CARE OF THIS PATIENT: 30 minutes.   POSSIBLE D/C IN 2-3 DAYS, DEPENDING ON CLINICAL CONDITION.   Henreitta Leber M.D on 06/04/2019 at 2:01 PM  Between 7am to 6pm - Pager - [phone]  After 6pm go to [email or web address] - [employer] [place] [facility]  Office  [phone]  CC: Primary care physician; Ezequiel Kayser, MD

## 2019-06-04 NOTE — Progress Notes (Signed)
PT Cancellation Note  Patient Details Name: Ethan Alvarez MRN: [id number] DOB: May 10, 1961   Cancelled Treatment:    Reason Eval/Treat Not Completed: Fatigue/lethargy limiting ability to participate   Pt asleep this am.  Did not awaken to verbal or tactile stim including initiation of LE P/AAROM.  Session stopped and will try again later for improved outcomes.   Chesley Noon 06/04/2019, 11:02 AM

## 2019-06-05 DIAGNOSIS — M65161 Other infective (teno)synovitis, right knee: Secondary | ICD-10-CM

## 2019-06-05 MED ORDER — RIFAMPIN 300 MG PO CAPS
300.0000 mg | ORAL_CAPSULE | Freq: Two times a day (BID) | ORAL | Status: DC
  Administered 2019-06-05 – 2019-06-09 (×8): 300 mg via ORAL
  Filled 2019-06-05 (×11): qty 1

## 2019-06-05 MED ORDER — SODIUM CHLORIDE 0.9% FLUSH: 10.0000 mL | INTRAVENOUS | Status: DC | PRN

## 2019-06-05 NOTE — TOC Progression Note (Addendum)
Transition of Care [facility]) - Progression Note    Patient Details  Name: Ethan Alvarez MRN: [id number] Date of Birth: 10-08-1961  Transition of Care [facility]) CM/SW Contact  Ethan Alvarez, Ethan Alvarez Phone Number: [phone]  06/05/2019, 3:20 PM  Clinical Narrative: Clinical Social Worker (CSW) received a call from patient's sister Ethan Alvarez. CSW made sister aware that inpatient rehab at [facility] will not accept patient. CSW presented SNF bed offers to [facility]. Per sister she will review SNF offers with the family and call CSW back with their choice. Patient's sister in law Ethan Alvarez is also aware of above. PASARR is still pending. Level 2 PASARR screener called CSW today and completed assessment and will send it in to the state. CSW will continue to follow and assist as needed.      Expected Discharge Plan: [place] Barriers to Discharge: Continued Medical Work up  Expected Discharge Plan and Services Expected Discharge Plan: [place] In-house Referral: Clinical Social Work Discharge Planning Services: CM Consult   Living arrangements for the past 2 months: [place], [place]                                       Social Determinants of Health (SDOH) Interventions    Readmission Risk Interventions Readmission Risk Prevention Plan 04/26/2019  Medication Screening Complete  Transportation Screening Complete  Some recent data might be hidden

## 2019-06-05 NOTE — Progress Notes (Signed)
[employer] - [place] at [facility]     PATIENT NAME: Ethan Alvarez    MR#:  [id number]  DATE OF BIRTH:  03/01/1961  SUBJECTIVE:   No acute events overnight.  Patient is status post right knee irrigation and debridement and primary repair of the quadriceps tendon postop day #3 today.   REVIEW OF SYSTEMS:    Review of Systems  Constitutional: Negative for chills and fever.  HENT: Negative for congestion and tinnitus.   Eyes: Negative for blurred vision and double vision.  Respiratory: Negative for cough, shortness of breath and wheezing.   Cardiovascular: Negative for chest pain, orthopnea and PND.  Gastrointestinal: Negative for abdominal pain, diarrhea, nausea and vomiting.  Genitourinary: Negative for dysuria and hematuria.  Musculoskeletal: Positive for joint pain (Right knee pain. ).  Neurological: Positive for weakness (globally weak. ). Negative for dizziness, sensory change and focal weakness.  All other systems reviewed and are negative.   Nutrition: Carb control diet Tolerating Diet: Yes Tolerating PT: await Eval.   DRUG ALLERGIES:   Allergies  Allergen Reactions   Haloperidol Lactate Other (See Comments)    Per [facility]; nature of intolerance unclear intolerance Other reaction(s): Other (See Comments) Per [facility]; nature of intolerance unclear     VITALS:  Blood pressure 134/81, pulse 88, temperature 97.9 F (36.6 C), temperature source Oral, resp. rate (!) 23, height 5\' 9"  (1.753 m), weight 116.4 kg, SpO2 100 %.  PHYSICAL EXAMINATION:   Physical Exam  GENERAL:  58 y.o.-year-old patient lying in bed in no acute distress.  EYES: Pupils equal, round, reactive to light and accommodation. No scleral icterus. Extraocular muscles intact.  HEENT: Head atraumatic, normocephalic. Oropharynx and nasopharynx clear.  NECK:  Supple, no jugular venous distention. No thyroid enlargement, no tenderness.  LUNGS: Normal breath sounds bilaterally, no wheezing,  rales, rhonchi. No use of accessory muscles of respiration.  CARDIOVASCULAR: S1, S2 normal. No murmurs, rubs, or gallops.  ABDOMEN: Soft, nontender, nondistended. Bowel sounds present. No organomegaly or mass.  EXTREMITIES: No cyanosis, clubbing or edema b/l.  Right knee dressing in place from I & D.    NEUROLOGIC: Cranial nerves II through XII are intact. No focal Motor or sensory deficits b/l.  Globally weak.    PSYCHIATRIC: The patient is alert and oriented x 2.  SKIN: No obvious rash, lesion, or ulcer.    LABORATORY PANEL:   CBC Recent Labs  Lab 06/04/19 0017  WBC 4.3  HGB 11.6*  HCT 36.0*  PLT 99*   ------------------------------------------------------------------------------------------------------------------  Chemistries  Recent Labs  Lab 06/01/19 1513 06/01/19 2218  06/04/19 0017  NA 137  --    < > 137  K 3.1*  --    < > 4.0  CL 110  --    < > 101  CO2 19*  --    < > 27  GLUCOSE 95  --    < > 124*  BUN 9  --    < > 11  CREATININE 0.59*  --    < > 0.70  CALCIUM 6.0*  --    < > 8.3*  MG  --  2.0  --   --   AST 15  --   --   --   ALT 13  --   --   --   ALKPHOS 39  --   --   --   BILITOT 0.7  --   --   --    < > =  values in this interval not displayed.   ------------------------------------------------------------------------------------------------------------------  Cardiac Enzymes Recent Labs  Lab 06/01/19 1513  TROPONINI <0.03   ------------------------------------------------------------------------------------------------------------------  RADIOLOGY:  No results found.   ASSESSMENT AND PLAN:   58 yo male with past medical history of hypertension, schizophrenia, recent hospitalization for a fall and right quadriceps tendon rupture which was repaired and discharged return back due to shortness of breath.  Patient during this hospitalization was noted to have sepsis secondary to septic arthritis of the right knee.  1.  Right knee cellulitis with  septic arthritis-this is a source of patient's sepsis as mentioned. - Patient's blood cultures are positive for staph aureus.  Seen by infectious disease and also by orthopedic surgery. -Patient is status post irrigation and debridement of the right knee and also primary repair of the quadriceps tendon rupture POD # 2 today.  - Continue pain control and further care as per orthopedics. -Discussed with infectious disease and repeat blood cultures remain negative.  Plan for possible PEG placement over the weekend.  Patient will likely need long-term IV antibiotics.  ID to discuss further with patient's family regarding that today.  2.  Shortness of breath-etiology unclear presently. - Suspected to be secondary to underlying sleep apnea.  Patient is clinically not hypoxic.  Not in CHF or any focal pneumonia on CT chest.  Follow clinically.  3.  History of schizophrenia-continue Depakote, Zyprexa, Cogentin.  4.  Essential hypertension-continue propranolol.  5.  Depression-continue Zoloft.  Await physical therapy evaluation but patient is quite weak and debilitated and likely will need short-term rehab upon discharge.  All the records are reviewed and case discussed with Care Management/Social Worker. Management plans discussed with the patient, family and they are in agreement.  CODE STATUS: Full code  DVT Prophylaxis: Lovenox  TOTAL TIME TAKING CARE OF THIS PATIENT: 30 minutes.   POSSIBLE D/C IN 2-3 DAYS, DEPENDING ON CLINICAL CONDITION.   Henreitta Leber M.D on 06/05/2019 at 2:28 PM  Between 7am to 6pm - Pager - [phone]  After 6pm go to [email or web address] - [employer] [place] [facility]  Office  [phone]  CC: Primary care physician; Ezequiel Kayser, MD

## 2019-06-05 NOTE — TOC Progression Note (Signed)
Transition of Care [facility]) - Progression Note    Patient Details  Name: EDUAR KUMPF MRN: [id number] Date of Birth: [birth year]  Transition of Care [facility]) CM/SW Contact  Eurydice Calixto, [name] Phone Number: [phone]  06/05/2019, 4:57 PM  Clinical Narrative: Clinical Social Worker (CSW) received call from patient's sister [name] stating that they chose [facility]. Per [name] [facility] liaison she will start [facility] SNF authorization tomorrow. CSW contacted [facility]) and asked them to cancel their [facility] SNF authoritarian. PASARR is still pending. CSW will continue to follow and assist as needed.     Expected Discharge Plan: [place] Barriers to Discharge: Continued Medical Work up  Expected Discharge Plan and Services Expected Discharge Plan: [place] In-house Referral: Clinical Social Work Discharge Planning Services: CM Consult   Living arrangements for the past 2 months: [place], [place]                                       Social Determinants of Health (SDOH) Interventions    Readmission Risk Interventions Readmission Risk Prevention Plan 04/26/2019  Medication Screening Complete  Transportation Screening Complete  Some recent data might be hidden

## 2019-06-05 NOTE — Progress Notes (Signed)
   Date of Admission:  06/01/2019       Subjective: Says he is doing okay  Medications:  . benztropine  0.5 mg Oral [id number]  . benztropine  1 mg Oral BID  . calcium-vitamin D  2 tablet Oral BID  . divalproex  1,000 mg Oral BID  . docusate sodium  100 mg Oral BID  . enoxaparin (LOVENOX) injection  40 mg Subcutaneous Q24H  . LORazepam  1 mg Oral BID  . OLANZapine  15 mg Oral Daily  . polyethylene glycol  17 g Oral Daily  . propranolol  20 mg Oral Daily  . senna  1 tablet Oral BID  . sertraline  100 mg Oral BID  . sodium chloride flush  3 mL Intravenous Q12H    Objective: Vital signs in last 24 hours: Temp:  [97.7 F (36.5 C)-99.8 F (37.7 C)] 97.9 F (36.6 C) (06/19 0801) Pulse Rate:  [85-88] 88 (06/19 0801) Resp:  [18-23] 23 (06/18 2259) BP: (125-134)/(69-90) 134/81 (06/19 0801) SpO2:  [99 %-100 %] 100 % (06/19 0801) Weight:  [116.4 kg] 116.4 kg (06/19 0500)  PHYSICAL EXAM:  General: Alert, cooperative, no distress, appears stated age. Has nasal oxygen Lungs:b/l air entry Heart: s1s2 Abdomen: Soft, non-tender,not distended. Bowel sounds normal. No masses Extremities:rt knee surgical area covered with dressing Skin: No rashes or lesions. Or bruising Lymph: Cervical, supraclavicular normal. Neurologic: Grossly non-focal  Lab Results Recent Labs    06/03/19 0642 06/04/19 0017  WBC 5.8 4.3  HGB 13.2 11.6*  HCT 40.6 36.0*  NA 135 137  K 4.8 4.0  CL 102 101  CO2 24 27  BUN 14 11  CREATININE 0.75 0.70    Microbiology: MSSA BC from 06/01/19 Rt knee synovial fluid culture MSSA from 06/02/19 [facility] 6/18- pending    Assessment/Plan: Staph aureus bacteremia- secondary to infected rt knee surgical site along with septic arthritis and infected tendon and also possible bone at the site of  the anchors- on cefazolin- will need for atleast 4-6  weeks TEE not needed as we will be giving a prolonged course of Iv antibiotic Will add rifampin 300mg  PO BID for 2 weeks max or  as tolerated    because of presence of FB- anchors. Watch for any drug interactions If blood culture from today remains negative tomorrow he can get PICC  Recent fall with rupture of quad tendon- s/p repair on 04/25/19  Schizophrenia on olanzapine, lorazepam, sertraline  ID will follow peripherally this weekend. Call if needed

## 2019-06-05 NOTE — Progress Notes (Signed)
Physical Therapy Treatment Patient Details Name: Ethan Alvarez MRN: [id number] DOB: 1961-03-21 Today's Date: 06/05/2019    History of Present Illness Burdette Alvarez Rheu is a 58 y.o. male arrived at the ED per EMS 06/01/2019 from [facility] rehab and nursing facility for shortness of breath, reports of confusion. Patient had a recent fall and was found to have ruptured his R quad tendon repair performed on 04/25/2019, and an infection of the wound. He was admitted to the hospital for deep infection s/p primary repair of R quad tendon rupture with recurrent R quad tendon rupture on 06/02/2019. He underwent irrigation and debridement with primary repair of recurrent R quadriceps tendon rupture on 06/02/2019. PMH includes schizophrenia, hypertension, history of repeated falls and quad tendon repair 04/25/2019. MRI R knee: Patellar tendon rupture and infection. RLE WBAT with knee immobilizer locked in extension, no ROM R knee    PT Comments    Attempted x 2 this am and again after lunch.  Pt remains asleep each attempt.  On 3rd, PROM BLE's for available ranges with R KI donned.  Pt did not awaken or assist with any exercises.  Pt not appropriate for OOB activities at this time.  Will continue as appropriate.    Lethargy is primary barrier to therapy at this time.   Follow Up Recommendations  SNF;Supervision for mobility/OOB     Equipment Recommendations  Rolling walker with 5" wheels;3in1 (PT)    Recommendations for Other Services       Precautions / Restrictions Precautions Precautions: Knee Precaution Comments: RLE WBAT with R knee immobilizer applied; no ROM in R knee Restrictions Weight Bearing Restrictions: Yes RLE Weight Bearing: Partial weight bearing Other Position/Activity Restrictions: with immobilizer applied, no ROM at R knee    Mobility  Bed Mobility               General bed mobility comments: deferred - asleep  Transfers                    Ambulation/Gait                 Stairs             Wheelchair Mobility    Modified Rankin (Stroke Patients Only)       Balance                                            Cognition Arousal/Alertness: Lethargic Behavior During Therapy: WFL for tasks assessed/performed Overall Cognitive Status: No family/caregiver present to determine baseline cognitive functioning                                        Exercises Other Exercises Other Exercises: BLE PROM for available ranges in supine with R KI donned.    General Comments        Pertinent Vitals/Pain Faces Pain Scale: No hurt    Home Living                      Prior Function            PT Goals (current goals can now be found in the care plan section) Progress towards PT goals: Progressing toward goals    Frequency  7X/week      PT Plan      Co-evaluation              AM-PAC PT "6 Clicks" Mobility   Outcome Measure  Help needed turning from your back to your side while in a flat bed without using bedrails?: Total Help needed moving from lying on your back to sitting on the side of a flat bed without using bedrails?: Total Help needed moving to and from a bed to a chair (including a wheelchair)?: Total Help needed standing up from a chair using your arms (e.g., wheelchair or bedside chair)?: Total Help needed to walk in hospital room?: Total Help needed climbing 3-5 steps with a railing? : Total 6 Click Score: 6    End of Session               Time: [id number] PT Time Calculation (min) (ACUTE ONLY): 9 min  Charges:  $Therapeutic Exercise: 8-22 mins                     Danielle DessSarah Nyhla Mountjoy, PTA 06/05/19, 3:16 PM

## 2019-06-05 NOTE — Progress Notes (Signed)
Subjective: 3 Days Post-Op Procedure(s) (LRB): REPAIR QUADRICEP TENDON (Right) INCISION AND DRAINAGE (Right) Patient reports pain as mild.   Patient is well, and has had no acute complaints or problems Plan is for discharge to rehab at this time. Negative for chest pain and shortness of breath Fever: 99.8 last night. Gastrointestinal:Negative for nausea and vomiting  Objective: Vital signs in last 24 hours: Temp:  [97.7 F (36.5 C)-99.8 F (37.7 C)] 99.8 F (37.7 C) (06/18 2259) Pulse Rate:  [85-95] 85 (06/18 2259) Resp:  [18-23] 23 (06/18 2259) BP: (125-129)/(69-90) 128/69 (06/18 2259) SpO2:  [97 %-100 %] 100 % (06/18 2259) Weight:  [116.4 kg] 116.4 kg (06/19 0500)  Intake/Output from previous day:  Intake/Output Summary (Last 24 hours) at 06/05/2019 0721 Last data filed at 06/04/2019 2324 Gross per 24 hour  Intake 2190.93 ml  Output 2500 ml  Net -309.07 ml    Intake/Output this shift: No intake/output data recorded.  Labs: Recent Labs    06/03/19 0642 06/04/19 0017  HGB 13.2 11.6*   Recent Labs    06/03/19 0642 06/04/19 0017  WBC 5.8 4.3  RBC 4.16* 3.67*  HCT 40.6 36.0*  PLT 78* 99*   Recent Labs    06/03/19 0642 06/04/19 0017  NA 135 137  K 4.8 4.0  CL 102 101  CO2 24 27  BUN 14 11  CREATININE 0.75 0.70  GLUCOSE 98 124*  CALCIUM 8.5* 8.3*   No results for input(s): LABPT, INR in the last 72 hours.   EXAM General - Patient is Alert, Appropriate and Oriented.  Patient is more alert this morning compared to yesterday. Extremity - Sensation intact distally Intact pulses distally Dorsiflexion/Plantar flexion intact Incision: dressing C/D/I   No erythema is noted to the right knee today, ecchymosis present. Dressing/Incision - Bulky dressing remains intact this AM.   Motor Function - intact, moving foot and toes well on exam.  Abdomen soft with normal BS.  Past Medical History:  Diagnosis Date  . Hypertension    Assessment/Plan: 3 Days  Post-Op Procedure(s) (LRB): REPAIR QUADRICEP TENDON (Right) INCISION AND DRAINAGE (Right) Active Problems:   Cellulitis of right knee  Estimated body mass index is 37.9 kg/m as calculated from the following:   Height as of this encounter: 5\' 9"  (1.753 m).   Weight as of this encounter: 116.4 kg. Advance diet Up with therapy D/C IV fluids when tolerating po intake.  Labs reviewed this AM, WBC 4.3 yesterday.. Hemovac was removed yesterday. Culture from surgery demonstrated staph aureus, continue Ancef at this time. Infectious disease following patient at this time. Remain in knee immobilizer at this time Will transition to a long leg cast prior to discharge.  DVT Prophylaxis - Lovenox, Foot Pumps and TED hose Weight-Bearing as tolerated to right leg in the knee [place], PA-C [facility] 06/05/2019, 7:21 AM

## 2019-06-06 LAB — COMPREHENSIVE METABOLIC PANEL
ALT: 12 U/L (ref 0–44)
AST: 14 U/L — ABNORMAL LOW (ref 15–41)
Albumin: 2.5 g/dL — ABNORMAL LOW (ref 3.5–5.0)
Alkaline Phosphatase: 47 U/L (ref 38–126)
Anion gap: 10 (ref 5–15)
BUN: 10 mg/dL (ref 6–20)
CO2: 33 mmol/L — ABNORMAL HIGH (ref 22–32)
Calcium: 9 mg/dL (ref 8.9–10.3)
Chloride: 93 mmol/L — ABNORMAL LOW (ref 98–111)
Creatinine, Ser: 0.75 mg/dL (ref 0.61–1.24)
GFR calc Af Amer: >60 mL/min (ref >60)
GFR calc non Af Amer: >60 mL/min (ref >60)
Glucose, Bld: 112 mg/dL — ABNORMAL HIGH (ref 70–99)
Potassium: 4.1 mmol/L (ref 3.5–5.1)
Sodium: 136 mmol/L (ref 135–145)
Total Bilirubin: 0.4 mg/dL (ref 0.3–1.2)
Total Protein: 6.7 g/dL (ref 6.5–8.1)

## 2019-06-06 LAB — GLUCOSE, CAPILLARY
Glucose-Capillary: 94 mg/dL (ref 70–99)
Glucose-Capillary: 115 mg/dL — ABNORMAL HIGH (ref 70–99)
Glucose-Capillary: 93 mg/dL (ref 70–99)

## 2019-06-06 LAB — CULTURE, BLOOD (ROUTINE X 2)
Culture: NO GROWTH
Special Requests: ADEQUATE

## 2019-06-06 LAB — CBC WITH DIFFERENTIAL/PLATELET
Abs Immature Granulocytes: 0.12 10*3/uL — ABNORMAL HIGH (ref 0.00–0.07)
Basophils Absolute: 0 10*3/uL (ref 0.0–0.1)
Basophils Relative: 0 %
Eosinophils Absolute: 0.2 10*3/uL (ref 0.0–0.5)
Eosinophils Relative: 3 %
HCT: 34.9 % — ABNORMAL LOW (ref 39.0–52.0)
Hemoglobin: 11.4 g/dL — ABNORMAL LOW (ref 13.0–17.0)
Immature Granulocytes: 2 %
Lymphocytes Relative: 12 %
Lymphs Abs: 0.6 10*3/uL — ABNORMAL LOW (ref 0.7–4.0)
MCH: 31.4 pg (ref 26.0–34.0)
MCHC: 32.7 g/dL (ref 30.0–36.0)
MCV: 96.1 fL (ref 80.0–100.0)
Monocytes Absolute: 0.8 10*3/uL (ref 0.1–1.0)
Monocytes Relative: 15 %
Neutro Abs: 3.4 10*3/uL (ref 1.7–7.7)
Neutrophils Relative %: 68 %
Platelets: 152 10*3/uL (ref 150–400)
RBC: 3.63 MIL/uL — ABNORMAL LOW (ref 4.22–5.81)
RDW: 13 % (ref 11.5–15.5)
WBC: 5.1 10*3/uL (ref 4.0–10.5)
nRBC: 0 % (ref 0.0–0.2)

## 2019-06-06 LAB — SEDIMENTATION RATE: Sed Rate: >140 mm/hr — ABNORMAL HIGH (ref 0–20)

## 2019-06-06 LAB — C-REACTIVE PROTEIN: CRP: 21.5 mg/dL — ABNORMAL HIGH (ref <1.0)

## 2019-06-06 MED ORDER — ENOXAPARIN SODIUM 40 MG/0.4ML ~~LOC~~ SOLN: 40.0000 mg | SUBCUTANEOUS | 0 refills | Status: DC

## 2019-06-06 MED ORDER — TRAMADOL HCL 50 MG PO TABS: 50.0000 mg | ORAL_TABLET | Freq: Four times a day (QID) | ORAL | 1 refills | Status: DC | PRN

## 2019-06-06 NOTE — Progress Notes (Signed)
Subjective: 4 Days Post-Op Procedure(s) (LRB): REPAIR QUADRICEP TENDON (Right) INCISION AND DRAINAGE (Right) Patient reports pain as mild.   Patient is well, and has had no acute complaints or problems Plan is for discharge to rehab at this time. Negative for chest pain and shortness of breath Fever: 98.7 last night. Gastrointestinal:Negative for nausea and vomiting  Objective: Vital signs in last 24 hours: Temp:  [97.9 F (36.6 C)-98.8 F (37.1 C)] 98.7 F (37.1 C) (06/20 0556) Pulse Rate:  [81-89] 89 (06/20 0556) Resp:  [20] 20 (06/20 0556) BP: (123-139)/(60-81) 139/77 (06/20 0556) SpO2:  [97 %-100 %] 100 % (06/20 0556) Weight:  [116.3 kg] 116.3 kg (06/20 0500)  Intake/Output from previous day:  Intake/Output Summary (Last 24 hours) at 06/06/2019 0719 Last data filed at 06/06/2019 0601 Gross per 24 hour  Intake -  Output 1450 ml  Net -1450 ml    Intake/Output this shift: No intake/output data recorded.  Labs: Recent Labs    06/04/19 0017 06/06/19 0501  HGB 11.6* 11.4*   Recent Labs    06/04/19 0017 06/06/19 0501  WBC 4.3 5.1  RBC 3.67* 3.63*  HCT 36.0* 34.9*  PLT 99* 152   Recent Labs    06/04/19 0017 06/06/19 0501  NA 137 136  K 4.0 4.1  CL 101 93*  CO2 27 33*  BUN 11 10  CREATININE 0.70 0.75  GLUCOSE 124* 112*  CALCIUM 8.3* 9.0   No results for input(s): LABPT, INR in the last 72 hours.   EXAM General - Patient is Alert, Appropriate and Oriented.  Patient is more alert this morning compared to yesterday. Extremity - Sensation intact distally Intact pulses distally Dorsiflexion/Plantar flexion intact Incision: dressing C/D/I   No erythema is noted to the right knee today, ecchymosis present. Dressing/Incision - Bulky dressing remains intact this AM.   Motor Function - intact, moving foot and toes well on exam.  Abdomen soft with normal BS.  Past Medical History:  Diagnosis Date  . Hypertension    Assessment/Plan: 4 Days Post-Op  Procedure(s) (LRB): REPAIR QUADRICEP TENDON (Right) INCISION AND DRAINAGE (Right) Active Problems:   Cellulitis of right knee  Estimated body mass index is 37.86 kg/m as calculated from the following:   Height as of this encounter: 5\' 9"  (1.753 m).   Weight as of this encounter: 116.3 kg. Advance diet Up with therapy D/C IV fluids when tolerating po intake.  Labs reviewed this AM, WBC 5.1 today. Culture from surgery demonstrated staph aureus, continue Ancef at this time. Infectious disease following patient at this time. Remain in knee immobilizer at this time Will transition to a long leg cast prior to discharge.  Possible today with Dr. Roland Rack  DVT Prophylaxis - Lovenox, Foot Pumps and TED hose Weight-Bearing as tolerated to right leg in the knee immobilizer  Reche Dixon, PA-C [facility] 06/06/2019, 7:19 AM

## 2019-06-06 NOTE — Progress Notes (Signed)
[employer] - [place] [facility]     PATIENT NAME: Ethan AduDouglas Munce    MR#:  [id number]  DATE OF BIRTH:  11/16/1961  SUBJECTIVE:   Patient states he is feeling better this morning.  He still endorses some right knee pain, but states that the pain medicines are helping.  No fevers or chills.   REVIEW OF SYSTEMS:    Review of Systems  Constitutional: Negative for chills and fever.  HENT: Negative for congestion and tinnitus.   Eyes: Negative for blurred vision and double vision.  Respiratory: Negative for cough, shortness of breath and wheezing.   Cardiovascular: Negative for chest pain, orthopnea and PND.  Gastrointestinal: Negative for abdominal pain, diarrhea, nausea and vomiting.  Genitourinary: Negative for dysuria and hematuria.  Musculoskeletal: Positive for joint pain (Right knee pain. ).  Neurological: Positive for weakness (globally weak. ). Negative for dizziness, sensory change and focal weakness.  All other systems reviewed and are negative.   Nutrition: Carb control diet Tolerating Diet: Yes Tolerating PT: await Eval.   DRUG ALLERGIES:   Allergies  Allergen Reactions  . Haloperidol Lactate Other (See Comments)    Per [facility]; nature of intolerance unclear intolerance Other reaction(s): Other (See Comments) Per [facility]; nature of intolerance unclear     VITALS:  Blood pressure 133/60, pulse 83, temperature 97.6 F (36.4 C), temperature source Oral, resp. rate 18, height 5\' 9"  (1.753 m), weight 116.3 kg, SpO2 99 %.  PHYSICAL EXAMINATION:   Physical Exam  GENERAL:  58 y.o.-year-old patient lying in bed in no acute distress.  EYES: Pupils equal, round, reactive to light and accommodation. No scleral icterus. Extraocular muscles intact.  HEENT: Head atraumatic, normocephalic. Oropharynx and nasopharynx clear.  NECK:  Supple, no jugular venous distention. No thyroid enlargement, no tenderness.  LUNGS: Normal breath sounds bilaterally, no wheezing,  rales, rhonchi. No use of accessory muscles of respiration.  CARDIOVASCULAR: RRR, S1, S2 normal. No murmurs, rubs, or gallops.  ABDOMEN: Soft, nontender, nondistended. Bowel sounds present. No organomegaly or mass.  EXTREMITIES: No cyanosis, clubbing or edema b/l.  Knee immobilizer present over right knee. NEUROLOGIC: Cranial nerves II through XII are intact. No focal Motor or sensory deficits b/l.  Globally weak.   Unable to assess strength in right lower extremity. PSYCHIATRIC: The patient is alert and oriented x 2.  SKIN: No obvious rash, lesion, or ulcer.    LABORATORY PANEL:   CBC Recent Labs  Lab 06/06/19 0501  WBC 5.1  HGB 11.4*  HCT 34.9*  PLT 152   ------------------------------------------------------------------------------------------------------------------  Chemistries  Recent Labs  Lab 06/01/19 2218  06/06/19 0501  NA  --    < > 136  K  --    < > 4.1  CL  --    < > 93*  CO2  --    < > 33*  GLUCOSE  --    < > 112*  BUN  --    < > 10  CREATININE  --    < > 0.75  CALCIUM  --    < > 9.0  MG 2.0  --   --   AST  --   --  14*  ALT  --   --  12  ALKPHOS  --   --  47  BILITOT  --   --  0.4   < > = values in this interval not displayed.   ------------------------------------------------------------------------------------------------------------------  Cardiac Enzymes Recent Labs  Lab 06/01/19 1513  TROPONINI <0.03   ------------------------------------------------------------------------------------------------------------------  RADIOLOGY:  No results found.   ASSESSMENT AND PLAN:   58 yo male with past medical history of hypertension, schizophrenia, recent hospitalization for a fall and right quadriceps tendon rupture which was repaired and discharged return back due to shortness of breath.  Patient during this hospitalization was noted to have sepsis secondary to septic arthritis of the right knee.  MSSA bacteremia secondary to right knee septic  arthritis- initially meeting sepsis criteria, but this has resolved. -Repeat blood cultures with no growth -S/p I&D of right knee and primary repair of quadriceps tendon rupture on 6/16 -Orthopedic surgery following- plan to transition from knee immobilizer to a long-leg cast prior to discharge -Pain control -Continue Ancef IV x4-6 weeks and rifampin 300 mg p.o. twice daily x 2 weeks per ID recommendations -Will need PICC prior to discharge  Shortness of breath- likely secondary to underlying OSA. -Monitor  History of schizophrenia- stable  -Continue Depakote, Zyprexa, Cogentin.  Essential hypertension- blood pressures normal  -Continue propranolol.  Depression- stable  -Continue Zoloft.  Plan for discharge to SNF, likely Monday.  All the records are reviewed and case discussed with Care Management/Social Worker. Management plans discussed with the patient, family and they are in agreement.  CODE STATUS: Full code  DVT Prophylaxis: Lovenox  TOTAL TIME TAKING CARE OF THIS PATIENT: 33 minutes.   POSSIBLE D/C IN 2 DAYS, DEPENDING ON CLINICAL CONDITION.   Berna Spare Mayo M.D on 06/06/2019 at 1:54 PM  Between 7am to 6pm - Pager [phone]  After 6pm go to [email or web address] - [employer] [place] [facility]  Office  [phone]  CC: Primary care physician; Ezequiel Kayser, MD

## 2019-06-06 NOTE — Progress Notes (Signed)
Physical Therapy Treatment Patient Details Name: Ethan Alvarez MRN: [id number] DOB: 12/27/1960 Today's Date: 06/06/2019    History of Present Illness Ethan Alvarez is a 58 y.o. male arrived at the ED per EMS 06/01/2019 from [facility] rehab and nursing facility for shortness of breath, reports of confusion. Patient had a recent fall and was found to have ruptured his R quad tendon repair performed on 04/25/2019, and an infection of the wound. He was admitted to the hospital for deep infection s/p primary repair of R quad tendon rupture with recurrent R quad tendon rupture on 06/02/2019. He underwent irrigation and debridement with primary repair of recurrent R quadriceps tendon rupture on 06/02/2019. PMH includes schizophrenia, hypertension, history of repeated falls and quad tendon repair 04/25/2019. MRI R knee: Patellar tendon rupture and infection. RLE WBAT with knee immobilizer locked in extension, no ROM R knee    PT Comments    Pt awake and alert this am.  On bedpan.  Tech in to assist with care as rolling left/right remains difficult for pt.  To edge of bed with mod a x 1.  Once sitting, min a for balance.  He was able to stand with walker and mod a x 2 and walker twice but he was unable to step.  Arm dropped on recliner and squat pivot max a x 2 to recliner.  Remained up after session.  Pt engaged and happy to be up today.   Follow Up Recommendations  SNF;Supervision for mobility/OOB     Equipment Recommendations  Rolling walker with 5" wheels;3in1 (PT)    Recommendations for Other Services       Precautions / Restrictions Precautions Precautions: Knee Precaution Comments: RLE WBAT with R knee immobilizer applied; no ROM in R knee Restrictions Weight Bearing Restrictions: Yes RLE Weight Bearing: Weight bearing as tolerated Other Position/Activity Restrictions: with immobilizer applied, no ROM at R knee    Mobility  Bed Mobility Overal bed mobility: Needs Assistance Bed  Mobility: Supine to Sit     Supine to sit: Mod assist        Transfers Overall transfer level: Needs assistance Equipment used: Rolling walker (2 wheeled) Transfers: Sit to/from [employer]Stand;Stand Pivot Transfers Sit to Stand: +2 physical assistance;Mod assist;From elevated surface Stand pivot transfers: Mod assist;Max assist;+2 physical assistance          Ambulation/Gait             General Gait Details: unable   Stairs             Wheelchair Mobility    Modified Rankin (Stroke Patients Only)       Balance Overall balance assessment: Needs assistance Sitting-balance support: Bilateral upper extremity supported;Feet supported Sitting balance-Leahy Scale: Fair Sitting balance - Comments: steady sitting at edge of bed with BUE support   Standing balance support: During functional activity Standing balance-Leahy Scale: Poor                              Cognition Arousal/Alertness: Awake/alert Behavior During Therapy: WFL for tasks assessed/performed Overall Cognitive Status: Within Functional Limits for tasks assessed                                 General Comments: present as if he has developmental delay      Exercises Other Exercises Other Exercises: Pt on bedpan, rolling with max  a x 1, resists at times.  tech in to assist with care    General Comments        Pertinent Vitals/Pain Pain Assessment: Faces Faces Pain Scale: Hurts a little bit Pain Descriptors / Indicators: Sore Pain Intervention(s): Limited activity within patient's tolerance;Monitored during session    Home Living                      Prior Function            PT Goals (current goals can now be found in the care plan section) Progress towards PT goals: Progressing toward goals    Frequency    7X/week      PT Plan Current plan remains appropriate    Co-evaluation              AM-PAC PT "6 Clicks" Mobility   Outcome  Measure  Help needed turning from your back to your side while in a flat bed without using bedrails?: Total Help needed moving from lying on your back to sitting on the side of a flat bed without using bedrails?: Total Help needed moving to and from a bed to a chair (including a wheelchair)?: Total Help needed standing up from a chair using your arms (e.g., wheelchair or bedside chair)?: Total Help needed to walk in hospital room?: Total Help needed climbing 3-5 steps with a railing? : Total 6 Click Score: 6    End of Session Equipment Utilized During Treatment: Gait belt Activity Tolerance: Patient tolerated treatment well Patient left: in chair;with chair alarm set;with call bell/phone within reach Nurse Communication: Mobility status       Time: 4010-2725 PT Time Calculation (min) (ACUTE ONLY): 20 min  Charges:  $Therapeutic Activity: 8-22 mins                     Chesley Noon, PTA 06/06/19, 1:01 PM

## 2019-06-07 LAB — GLUCOSE, CAPILLARY
Glucose-Capillary: 89 mg/dL (ref 70–99)
Glucose-Capillary: 110 mg/dL — ABNORMAL HIGH (ref 70–99)
Glucose-Capillary: 83 mg/dL (ref 70–99)
Glucose-Capillary: 94 mg/dL (ref 70–99)

## 2019-06-07 LAB — AEROBIC/ANAEROBIC CULTURE W GRAM STAIN (SURGICAL/DEEP WOUND): Gram Stain: NONE SEEN

## 2019-06-07 LAB — BASIC METABOLIC PANEL
Anion gap: 10 (ref 5–15)
BUN: 10 mg/dL (ref 6–20)
CO2: 31 mmol/L (ref 22–32)
Calcium: 8.9 mg/dL (ref 8.9–10.3)
Chloride: 94 mmol/L — ABNORMAL LOW (ref 98–111)
Creatinine, Ser: 0.57 mg/dL — ABNORMAL LOW (ref 0.61–1.24)
GFR calc Af Amer: >60 mL/min (ref >60)
GFR calc non Af Amer: >60 mL/min (ref >60)
Glucose, Bld: 102 mg/dL — ABNORMAL HIGH (ref 70–99)
Potassium: 4.1 mmol/L (ref 3.5–5.1)
Sodium: 135 mmol/L (ref 135–145)

## 2019-06-07 LAB — CBC
HCT: 36.7 % — ABNORMAL LOW (ref 39.0–52.0)
Hemoglobin: 12 g/dL — ABNORMAL LOW (ref 13.0–17.0)
MCH: 31.7 pg (ref 26.0–34.0)
MCHC: 32.7 g/dL (ref 30.0–36.0)
MCV: 97.1 fL (ref 80.0–100.0)
Platelets: 166 10*3/uL (ref 150–400)
RBC: 3.78 MIL/uL — ABNORMAL LOW (ref 4.22–5.81)
RDW: 13 % (ref 11.5–15.5)
WBC: 4.6 10*3/uL (ref 4.0–10.5)
nRBC: 0 % (ref 0.0–0.2)

## 2019-06-07 NOTE — Progress Notes (Signed)
[employer] - [place] [facility]     PATIENT NAME: Ethan AduDouglas Dorion    MR#:  [id number]  DATE OF BIRTH:  01/28/1961  SUBJECTIVE:   Patient states he is feeling well this morning.  He still has some right knee pain, but this is much better than the previous couple of days.  He denies any fevers or chills.  REVIEW OF SYSTEMS:    Review of Systems  Constitutional: Negative for chills and fever.  HENT: Negative for congestion and tinnitus.   Eyes: Negative for blurred vision and double vision.  Respiratory: Negative for cough, shortness of breath and wheezing.   Cardiovascular: Negative for chest pain, orthopnea and PND.  Gastrointestinal: Negative for abdominal pain, diarrhea, nausea and vomiting.  Genitourinary: Negative for dysuria and hematuria.  Musculoskeletal: Positive for joint pain (Right knee pain. ).  Neurological: Positive for weakness (globally weak. ). Negative for dizziness, sensory change and focal weakness.  All other systems reviewed and are negative.   Nutrition: Carb control diet Tolerating Diet: Yes Tolerating PT: await Eval.   DRUG ALLERGIES:   Allergies  Allergen Reactions  . Haloperidol Lactate Other (See Comments)    Per [facility]; nature of intolerance unclear intolerance Other reaction(s): Other (See Comments) Per [facility]; nature of intolerance unclear     VITALS:  Blood pressure (!) 157/89, pulse 90, temperature 98.2 F (36.8 C), resp. rate 18, height 5\' 9"  (1.753 m), weight 116.3 kg, SpO2 97 %.  PHYSICAL EXAMINATION:   Physical Exam  GENERAL:  58 y.o.-year-old patient lying in bed in no acute distress.  EYES: Pupils equal, round, reactive to light and accommodation. No scleral icterus. Extraocular muscles intact.  HEENT: Head atraumatic, normocephalic. Oropharynx and nasopharynx clear.  NECK:  Supple, no jugular venous distention. No thyroid enlargement, no tenderness.  LUNGS: Normal breath sounds bilaterally, no wheezing, rales,  rhonchi. No use of accessory muscles of respiration.  CARDIOVASCULAR: RRR, S1, S2 normal. No murmurs, rubs, or gallops.  ABDOMEN: Soft, nontender, nondistended. Bowel sounds present. No organomegaly or mass.  EXTREMITIES: No cyanosis, clubbing or edema b/l.  Knee immobilizer present over right leg. NEUROLOGIC: Cranial nerves II through XII are intact. No focal Motor or sensory deficits b/l.  Globally weak.   Unable to assess strength in right lower extremity. PSYCHIATRIC: The patient is alert and oriented x 3.  SKIN: No obvious rash, lesion, or ulcer.    LABORATORY PANEL:   CBC Recent Labs  Lab 06/07/19 0646  WBC 4.6  HGB 12.0*  HCT 36.7*  PLT 166   ------------------------------------------------------------------------------------------------------------------  Chemistries  Recent Labs  Lab 06/01/19 2218  06/06/19 0501 06/07/19 0646  NA  --    < > 136 135  K  --    < > 4.1 4.1  CL  --    < > 93* 94*  CO2  --    < > 33* 31  GLUCOSE  --    < > 112* 102*  BUN  --    < > 10 10  CREATININE  --    < > 0.75 0.57*  CALCIUM  --    < > 9.0 8.9  MG 2.0  --   --   --   AST  --   --  14*  --   ALT  --   --  12  --   ALKPHOS  --   --  47  --   BILITOT  --   --  0.4  --    < > =  values in this interval not displayed.   ------------------------------------------------------------------------------------------------------------------  Cardiac Enzymes Recent Labs  Lab 06/01/19 1513  TROPONINI <0.03   ------------------------------------------------------------------------------------------------------------------  RADIOLOGY:  No results found.   ASSESSMENT AND PLAN:   58 yo male with past medical history of hypertension, schizophrenia, recent hospitalization for a fall and right quadriceps tendon rupture which was repaired and discharged return back due to shortness of breath.  Patient during this hospitalization was noted to have sepsis secondary to septic arthritis of the  right knee.  MSSA bacteremia secondary to right knee septic arthritis- initially meeting sepsis criteria, but this has resolved. -Repeat blood cultures with no growth -S/p I&D of right knee and primary repair of quadriceps tendon rupture on 6/16 -Orthopedic surgery following- plan to transition from knee immobilizer to a long-leg cast tomorrow -Pain control -Continue Ancef IV x4-6 weeks and rifampin 300 mg p.o. twice daily x 2 weeks per ID recommendations -Will need PICC prior to discharge  Shortness of breath- likely secondary to underlying OSA. SOB has resolved. Currenly on 2L O2, but O2 saturations are 100%. -Monitor -Wean patient to room air today.  History of schizophrenia- stable  -Continue Depakote, Zyprexa, Cogentin.  Essential hypertension- blood pressures normal  -Continue propranolol.  Depression- stable  -Continue Zoloft.  Plan for discharge to SNF likely tomorrow.  All the records are reviewed and case discussed with Care Management/Social Worker. Management plans discussed with the patient, family and they are in agreement.  CODE STATUS: Full code  DVT Prophylaxis: Lovenox  TOTAL TIME TAKING CARE OF THIS PATIENT: 33 minutes.   POSSIBLE D/C tomorrow, DEPENDING ON CLINICAL CONDITION.   Berna Spare Xsavier Seeley M.D on 06/07/2019 at 11:43 AM  Between 7am to 6pm - Pager - [phone]  After 6pm go to [email or web address] - [employer] [facility]  Office  [phone]  CC: Primary care physician; Ezequiel Kayser, MD

## 2019-06-07 NOTE — Progress Notes (Signed)
Physical Therapy Treatment Patient Details Name: Ethan Alvarez MRN: [id number] DOB: June 05, 1961 Today's Date: 06/07/2019    History of Present Illness Ethan Alvarez is a 58 y.o. male arrived at the ED per EMS 06/01/2019 from [facility] re[facility] nursing facility for shortness of breath, reports of confusion. Patient had a recent fall and was found to have ruptured his R quad tendon repair performed on 04/25/2019, and an infection of the wound. He was admitted to the hospital for deep infection s/p primary repair of R quad tendon rupture with recurrent R quad tendon rupture on 06/02/2019. He underwent irrigation and debridement with primary repair of recurrent R quadriceps tendon rupture on 06/02/2019. PMH includes schizophrenia, hypertension, history of repeated falls and quad tendon repair 04/25/2019. MRI R knee: Patellar tendon rupture and infection. RLE WBAT with knee immobilizer locked in extension, no ROM R knee    PT Comments    Pt awake but a bit drowsy today.  Participated in exercises as described below.To edge of bed with mod/max a x 1.  Pt unable to sit upright today despite mod a x 1 and verbal/tactile cues.  Post left lean.  Pt sat x 5 minutes and was unable to sit without extensive assist.  Further standing mobility/transfers deferred for pt and staff safety.  Nursing tech stated he was +3 assist to get back to bed yesterday afternoon.  When drowsy or fatigued, pt is appropriate for lift transfers as needed.   Follow Up Recommendations  SNF;Supervision for mobility/OOB     Equipment Recommendations  Rolling walker with 5" wheels;3in1 (PT)    Recommendations for Other Services       Precautions / Restrictions Precautions Precautions: Knee Precaution Comments: RLE WBAT with R knee immobilizer applied; no ROM in R knee Restrictions Weight Bearing Restrictions: Yes RLE Weight Bearing: Weight bearing as tolerated Other Position/Activity Restrictions: with immobilizer applied, no ROM  at R knee    Mobility  Bed Mobility Overal bed mobility: Needs Assistance Bed Mobility: Supine to Sit     Supine to sit: Mod assist        Transfers                 General transfer comment: deferred today - unable to sit upright today desite mod a x 1.  Ambulation/Gait             General Gait Details: unable   Stairs             Wheelchair Mobility    Modified Rankin (Stroke Patients Only)       Balance Overall balance assessment: Needs assistance Sitting-balance support: Bilateral upper extremity supported;Feet supported Sitting balance-Leahy Scale: Poor Sitting balance - Comments: uanble to sit upright today despite mod a x 1.       Standing balance comment: deferred due to poor sitting balance                            Cognition Arousal/Alertness: Awake/alert Behavior During Therapy: WFL for tasks assessed/performed Overall Cognitive Status: Within Functional Limits for tasks assessed                                 General Comments: present as if he has developmental delay      Exercises Other Exercises Other Exercises: RLE SLR, ab/add and ankle pumps Other Exercises: sitting EOB x  5 mintues    General Comments        Pertinent Vitals/Pain Pain Assessment: No/denies pain    Home Living                      Prior Function            PT Goals (current goals can now be found in the care plan section) Progress towards PT goals: Progressing toward goals    Frequency    7X/week      PT Plan Current plan remains appropriate    Co-evaluation              AM-PAC PT "6 Clicks" Mobility   Outcome Measure  Help needed turning from your back to your side while in a flat bed without using bedrails?: Total Help needed moving from lying on your back to sitting on the side of a flat bed without using bedrails?: Total Help needed moving to and from a bed to a chair (including a  wheelchair)?: Total Help needed standing up from a chair using your arms (e.g., wheelchair or bedside chair)?: Total Help needed to walk in hospital room?: Total Help needed climbing 3-5 steps with a railing? : Total 6 Click Score: 6    End of Session   Activity Tolerance: Patient tolerated treatment well Patient left: in bed;with call bell/phone within reach;with bed alarm set         Time: 1141-1153 PT Time Calculation (min) (ACUTE ONLY): 12 min  Charges:  $Therapeutic Exercise: 8-22 mins                     Danielle DessSarah Shakti Fleer, PTA 06/07/19, 1:01 PM

## 2019-06-08 ENCOUNTER — Inpatient Hospital Stay: Payer: Self-pay

## 2019-06-08 LAB — GLUCOSE, CAPILLARY
Glucose-Capillary: 96 mg/dL (ref 70–99)
Glucose-Capillary: 98 mg/dL (ref 70–99)
Glucose-Capillary: 93 mg/dL (ref 70–99)

## 2019-06-08 LAB — CREATININE, SERUM
Creatinine, Ser: 0.67 mg/dL (ref 0.61–1.24)
GFR calc Af Amer: >60 mL/min (ref >60)
GFR calc non Af Amer: >60 mL/min (ref >60)

## 2019-06-08 MED ORDER — POLYETHYLENE GLYCOL 3350 17 G PO PACK: 17.0000 g | PACK | Freq: Every day | ORAL | 0 refills | Status: DC | PRN

## 2019-06-08 MED ORDER — RIFAMPIN 300 MG PO CAPS: 300.0000 mg | ORAL_CAPSULE | Freq: Two times a day (BID) | ORAL | 0 refills | Status: AC

## 2019-06-08 MED ORDER — SODIUM CHLORIDE 0.9% FLUSH
10.0000 mL | Freq: Two times a day (BID) | INTRAVENOUS | Status: DC
  Administered 2019-06-08 – 2019-06-09 (×2): 10 mL

## 2019-06-08 MED ORDER — OXYCODONE HCL 5 MG PO TABS: 5.0000 mg | ORAL_TABLET | ORAL | 0 refills | Status: DC | PRN

## 2019-06-08 MED ORDER — SODIUM CHLORIDE 0.9% FLUSH: 10.0000 mL | INTRAVENOUS | Status: DC | PRN

## 2019-06-08 MED ORDER — CEFAZOLIN SODIUM-DEXTROSE 2-4 GM/100ML-% IV SOLN: 2.0000 g | Freq: Three times a day (TID) | INTRAVENOUS | 0 refills | Status: DC

## 2019-06-08 NOTE — Discharge Summary (Signed)
[facility] [place] NAME: Ethan Alvarez    MR#:  [id number]  DATE OF BIRTH:  [birth year]  DATE OF ADMISSION:  06/01/2019   ADMITTING PHYSICIAN: Hillary Bow, MD  DATE OF DISCHARGE: 06/08/19  PRIMARY CARE PHYSICIAN: Ezequiel Kayser, MD   ADMISSION DIAGNOSIS:  Hypocalcemia [E83.51] Shortness of breath [R06.02] Altered mental status, unspecified altered mental status type [R41.82] DISCHARGE DIAGNOSIS:  Active Problems:   Cellulitis of right knee  SECONDARY DIAGNOSIS:   Past Medical History:  Diagnosis Date  . Hypertension    HOSPITAL COURSE:   Ethan Alvarez is a 58 year old male who presented to the ED with right knee pain, redness, and swelling after possible fall at home. Right knee x-ray showed an effusion. MRI showed complete rupture of the patellar tendon, possible septic arthritis, and patellar osteomyelitis. Patient was admitted for further management.  MSSA bacteremia secondary to right knee septic arthritis- initially meeting sepsis criteria, but this has resolved. -Repeat blood cultures with no growth -S/p I&D of right knee and primary repair of recurrent quadriceps tendon rupture on 6/16 -Patient placed in long leg cast prior to discharge -Seen by ID, who recommended Ancef 2g IV every 8 hours until 07/16/19 and Rifampin 320m PO BID until 07/16/19  -Patient needs the following weekly labs while on IV antibiotics: CBC with differential, CMP, CRP, ESR -PICC placed prior to discharge -Needs to f/u with ID and orthopedic surgery as an outpatient  Shortness of breath- likely secondary to underlying OSA. SOB has resolved. -Monitor  History of schizophrenia- stable  -Continued Depakote, Zyprexa, Cogentin.  Essential hypertension- blood pressures normal  -Continued propranolol.  Depression- stable  -Continued Zoloft.  DISCHARGE CONDITIONS:  MSSA bacteremia Right knee septic arthritis Schizophrenia Hypertension Depression  CONSULTS OBTAINED:  Treatment Team:  Poggi, JMarshall Cork MD DRUG ALLERGIES:   Allergies  Allergen Reactions  . Haloperidol Lactate Other (See Comments)    Per [facility]; nature of intolerance unclear intolerance Other reaction(s): Other (See Comments) Per [facility]; nature of intolerance unclear    DISCHARGE MEDICATIONS:   Allergies as of 06/08/2019      Reactions   Haloperidol Lactate Other (See Comments)   Per [facility]; nature of intolerance unclear intolerance Other reaction(s): Other (See Comments) Per [facility]; nature of intolerance unclear      Medication List    TAKE these medications   acetaminophen 325 MG tablet Commonly known as: TYLENOL Take 650 mg by mouth every 4 (four) hours as needed for mild pain.   benztropine 0.5 MG tablet Commonly known as: COGENTIN Take 0.5 mg by mouth every morning.   benztropine 1 MG tablet Commonly known as: COGENTIN Take 1 mg by mouth 2 (two) times daily.   ceFAZolin 2-4 GM/100ML-% IVPB Commonly known as: ANCEF Inject 100 mLs (2 g total) into the vein every 8 (eight) hours.   clonazePAM 0.5 MG tablet Commonly known as: KLONOPIN Take 0.5 mg by mouth 3 (three) times daily as needed for agitation.   divalproex 500 MG DR tablet Commonly known as: DEPAKOTE Take 1,000 mg by mouth 2 (two) times a day.   enoxaparin 40 MG/0.4ML injection Commonly known as: LOVENOX Inject 0.4 mLs (40 mg total) into the skin daily for 14 days.   ILorayne BenderSustenna 234 MG/1.5ML Susy injection Generic drug: paliperidone Inject 234 mg into the muscle every 30 (thirty) days.   ketoconazole 2 % cream Commonly known as: NIZORAL Apply 1 application topically. To groin and surrounding areas twice  daily until resolved   LORazepam 0.5 MG tablet Commonly known as: ATIVAN Take 0.5 mg by mouth every morning.   LORazepam 0.5 MG tablet Commonly known as: ATIVAN Take 0.5 mg by mouth daily as needed for anxiety.   LORazepam 1 MG tablet Commonly known as: ATIVAN Take 1 mg by  mouth 2 (two) times daily.   metroNIDAZOLE 0.75 % gel Commonly known as: METROGEL Apply 1 application topically daily.   OLANZapine 15 MG tablet Commonly known as: ZYPREXA Take 15 mg by mouth daily.   oxyCODONE 5 MG immediate release tablet Commonly known as: Oxy IR/ROXICODONE Take 1-2 tablets (5-10 mg total) by mouth every 4 (four) hours as needed for severe pain. What changed: reasons to take this   polyethylene glycol 17 g packet Commonly known as: MIRALAX / GLYCOLAX Take 17 g by mouth daily as needed for mild constipation.   propranolol 20 MG tablet Commonly known as: INDERAL Take 20 mg by mouth daily.   rifampin 300 MG capsule Commonly known as: RIFADIN Take 1 capsule (300 mg total) by mouth every 12 (twelve) hours.   sertraline 100 MG tablet Commonly known as: ZOLOFT Take 100 mg by mouth 2 (two) times a day.   traMADol 50 MG tablet Commonly known as: ULTRAM Take 1 tablet (50 mg total) by mouth every 6 (six) hours as needed for moderate pain.        DISCHARGE INSTRUCTIONS:  1. F/u with PCP in 5 days 2. F/u with ortho in 2 weeks 3. F/u with ID in 4 weeks 4. Continue IV ancef and PO Rifampin until 07/16/19 5. Needs the following labs while on IV abx- CBC w/ diff, CMP, ESR, CRP DIET:  Regular diet and Cardiac diet DISCHARGE CONDITION:  Stable ACTIVITY:  WBAT to right leg OXYGEN:  Home Oxygen: No.  Oxygen Delivery: room air DISCHARGE LOCATION:  nursing home   If you experience worsening of your admission symptoms, develop shortness of breath, life threatening emergency, suicidal or homicidal thoughts you must seek medical attention immediately by calling 911 or calling your MD immediately  if symptoms less severe.  You Must read complete instructions/literature along with all the possible adverse reactions/side effects for all the Medicines you take and that have been prescribed to you. Take any new Medicines after you have completely understood and accpet  all the possible adverse reactions/side effects.   Please note  You were cared for by a hospitalist during your hospital stay. If you have any questions about your discharge medications or the care you received while you were in the hospital after you are discharged, you can call the unit and asked to speak with the hospitalist on call if the hospitalist that took care of you is not available. Once you are discharged, your primary care physician will handle any further medical issues. Please note that NO REFILLS for any discharge medications will be authorized once you are discharged, as it is imperative that you return to your primary care physician (or establish a relationship with a primary care physician if you do not have one) for your aftercare needs so that they can reassess your need for medications and monitor your lab values.    On the day of Discharge:  VITAL SIGNS:  Blood pressure 130/69, pulse (!) 107, temperature 98.4 F (36.9 C), temperature source Oral, resp. rate 20, height _0  (1.753 m), weight 116.3 kg, SpO2 93 %. PHYSICAL EXAMINATION:  GENERAL:  58 y.o.-year-old patient lying in the bed  with no acute distress.  EYES: Pupils equal, round, reactive to light and accommodation. No scleral icterus. Extraocular muscles intact.  HEENT: Head atraumatic, normocephalic. Oropharynx and nasopharynx clear.  NECK:  Supple, no jugular venous distention. No thyroid enlargement, no tenderness.  LUNGS: Normal breath sounds bilaterally, no wheezing, rales,rhonchi or crepitation. No use of accessory muscles of respiration.  CARDIOVASCULAR: S1, S2 normal. No murmurs, rubs, or gallops.  ABDOMEN: Soft, non-tender, non-distended. Bowel sounds present. No organomegaly or mass.  EXTREMITIES: No pedal edema, cyanosis, or clubbing. +knee immobilizer to right leg. NEUROLOGIC: Cranial nerves II through XII are intact.+global weakness, unable to assess strength in right leg. Sensation intact. Gait not  checked.  PSYCHIATRIC: The patient is alert and oriented x 3.  SKIN: No obvious rash, lesion, or ulcer.  DATA REVIEW:   CBC Recent Labs  Lab 06/07/19 0646  WBC 4.6  HGB 12.0*  HCT 36.7*  PLT 166    Chemistries  Recent Labs  Lab 06/01/19 2218  06/06/19 0501 06/07/19 0646 06/08/19 0456  NA  --    < > 136 135  --   K  --    < > 4.1 4.1  --   CL  --    < > 93* 94*  --   CO2  --    < > 33* 31  --   GLUCOSE  --    < > 112* 102*  --   BUN  --    < > 10 10  --   CREATININE  --    < > 0.75 0.57* 0.67  CALCIUM  --    < > 9.0 8.9  --   MG 2.0  --   --   --   --   AST  --   --  14*  --   --   ALT  --   --  12  --   --   ALKPHOS  --   --  47  --   --   BILITOT  --   --  0.4  --   --    < > = values in this interval not displayed.     Microbiology Results  Results for orders placed or performed during the hospital encounter of 06/01/19  Blood culture (routine x 2)     Status: Abnormal   Collection Time: 06/01/19  3:45 PM   Specimen: BLOOD  Result Value Ref Range Status   Specimen Description   Final    BLOOD LEFT ANTECUBITAL Performed at [facility], [address] [place], [place] [address]    Special Requests   Final    BOTTLES DRAWN AEROBIC AND ANAEROBIC Blood Culture results may not be optimal due to an excessive volume of blood received in culture bottles Performed at [facility], [address], [place], [place] [address]    Culture  Setup Time   Final    GRAM POSITIVE COCCI ANAEROBIC BOTTLE ONLY CRITICAL RESULT CALLED TO, READ BACK BY AND VERIFIED WITH: Rayna Sexton AT 1000 06/02/2019 Organ Performed at [facility], [place] [address] [place], [place] [address]    Culture STAPHYLOCOCCUS AUREUS (A)  Final   Report Status 06/04/2019 FINAL  Final   Organism ID, Bacteria STAPHYLOCOCCUS AUREUS  Final      Susceptibility   Staphylococcus aureus - MIC*    CIPROFLOXACIN <=0.5 SENSITIVE Sensitive     ERYTHROMYCIN <=0.25 SENSITIVE Sensitive      GENTAMICIN <=  0.5 SENSITIVE Sensitive     OXACILLIN <=0.25 SENSITIVE Sensitive     TETRACYCLINE <=1 SENSITIVE Sensitive     VANCOMYCIN 1 SENSITIVE Sensitive     TRIMETH/SULFA <=10 SENSITIVE Sensitive     CLINDAMYCIN <=0.25 SENSITIVE Sensitive     RIFAMPIN <=0.5 SENSITIVE Sensitive     Inducible Clindamycin NEGATIVE Sensitive     * STAPHYLOCOCCUS AUREUS  Blood Culture ID Panel (Reflexed)     Status: Abnormal   Collection Time: 06/01/19  3:45 PM  Result Value Ref Range Status   Enterococcus species NOT DETECTED NOT DETECTED Final   Listeria monocytogenes NOT DETECTED NOT DETECTED Final   Staphylococcus species DETECTED (A) NOT DETECTED Final    Comment: CRITICAL RESULT CALLED TO, READ BACK BY AND VERIFIED WITH:  Rayna Sexton AT 1000 06/02/2019 SDR    Staphylococcus aureus (BCID) DETECTED (A) NOT DETECTED Final    Comment: Methicillin (oxacillin) susceptible Staphylococcus aureus (MSSA). Preferred therapy is anti staphylococcal beta lactam antibiotic (Cefazolin or Nafcillin), unless clinically contraindicated. CRITICAL RESULT CALLED TO, READ BACK BY AND VERIFIED WITH:  Rayna Sexton AT 1000 06/02/2019 SDR    Methicillin resistance NOT DETECTED NOT DETECTED Final   Streptococcus species NOT DETECTED NOT DETECTED Final   Streptococcus agalactiae NOT DETECTED NOT DETECTED Final   Streptococcus pneumoniae NOT DETECTED NOT DETECTED Final   Streptococcus pyogenes NOT DETECTED NOT DETECTED Final   Acinetobacter baumannii NOT DETECTED NOT DETECTED Final   Enterobacteriaceae species NOT DETECTED NOT DETECTED Final   Enterobacter cloacae complex NOT DETECTED NOT DETECTED Final   Escherichia coli NOT DETECTED NOT DETECTED Final   Klebsiella oxytoca NOT DETECTED NOT DETECTED Final   Klebsiella pneumoniae NOT DETECTED NOT DETECTED Final   Proteus species NOT DETECTED NOT DETECTED Final   Serratia marcescens NOT DETECTED NOT DETECTED Final   Haemophilus influenzae NOT DETECTED NOT DETECTED Final    Neisseria meningitidis NOT DETECTED NOT DETECTED Final   Pseudomonas aeruginosa NOT DETECTED NOT DETECTED Final   Candida albicans NOT DETECTED NOT DETECTED Final   Candida glabrata NOT DETECTED NOT DETECTED Final   Candida krusei NOT DETECTED NOT DETECTED Final   Candida parapsilosis NOT DETECTED NOT DETECTED Final   Candida tropicalis NOT DETECTED NOT DETECTED Final    Comment: Performed at [facility], [address] [place], [place] [address]  SARS Coronavirus 2 (CEPHEID- Performed in [place] hospital lab), Hosp Order     Status: None   Collection Time: 06/01/19  5:47 PM   Specimen: Nasopharyngeal Swab  Result Value Ref Range Status   SARS Coronavirus 2 NEGATIVE NEGATIVE Final    Comment: (NOTE) If result is NEGATIVE SARS-CoV-2 target nucleic acids are NOT DETECTED. The SARS-CoV-2 RNA is generally detectable in upper and lower  respiratory specimens during the acute phase of infection. The lowest  concentration of SARS-CoV-2 viral copies this assay can detect is 250  copies / mL. A negative result does not preclude SARS-CoV-2 infection  and should not be used as the sole basis for treatment or other  patient management decisions.  A negative result may occur with  improper specimen collection / handling, submission of specimen other  than nasopharyngeal swab, presence of viral mutation(s) within the  areas targeted by this assay, and inadequate number of viral copies  (<250 copies / mL). A negative result must be combined with clinical  observations, patient history, and epidemiological information. If result is POSITIVE SARS-CoV-2 target nucleic acids are DETECTED. The SARS-CoV-2 RNA is generally detectable  in upper and lower  respiratory specimens dur ing the acute phase of infection.  Positive  results are indicative of active infection with SARS-CoV-2.  Clinical  correlation with patient history and other diagnostic information is  necessary to determine  patient infection status.  Positive results do  not rule out bacterial infection or co-infection with other viruses. If result is PRESUMPTIVE POSTIVE SARS-CoV-2 nucleic acids MAY BE PRESENT.   A presumptive positive result was obtained on the submitted specimen  and confirmed on repeat testing.  While 2019 novel coronavirus  (SARS-CoV-2) nucleic acids may be present in the submitted sample  additional confirmatory testing may be necessary for epidemiological  and / or clinical management purposes  to differentiate between  SARS-CoV-2 and other Sarbecovirus currently known to infect humans.  If clinically indicated additional testing with an alternate test  methodology [phone]) is advised. The SARS-CoV-2 RNA is generally  detectable in upper and lower respiratory sp ecimens during the acute  phase of infection. The expected result is Negative. Fact Sheet for Patients:  StrictlyIdeas.no Fact Sheet for Healthcare Providers: BankingDealers.co.za This test is not yet approved or cleared by the [place] FDA and has been authorized for detection and/or diagnosis of SARS-CoV-2 by FDA under an Emergency Use Authorization (EUA).  This EUA will remain in effect (meaning this test can be used) for the duration of the COVID-19 declaration under Section 564(b)(1) of the Act, 21 U.S.C. section 360bbb-3(b)(1), unless the authorization is terminated or revoked sooner. Performed at [facility], [address] [place], [place] [address]   Blood culture (routine x 2)     Status: None   Collection Time: 06/01/19 10:17 PM   Specimen: BLOOD  Result Value Ref Range Status   Specimen Description BLOOD BLOOD RIGHT HAND  Final   Special Requests   Final    BOTTLES DRAWN AEROBIC AND ANAEROBIC Blood Culture adequate volume   Culture   Final    NO GROWTH 5 DAYS Performed at [facility], [address], [place], [place] [address]     Report Status 06/06/2019 FINAL  Final  Aerobic/Anaerobic Culture (surgical/deep wound)     Status: None   Collection Time: 06/02/19  4:03 PM   Specimen: Wound  Result Value Ref Range Status   Specimen Description   Final    WOUND Performed at [facility], [address] [place], [place] [address]    Special Requests RIGHT KNEE JOINT  Final   Gram Stain NO WBC SEEN NO ORGANISMS SEEN   Final   Culture   Final    FEW STAPHYLOCOCCUS AUREUS NO ANAEROBES ISOLATED Performed at [facility], [place] [address], [place], [place] [address]    Report Status 06/07/2019 FINAL  Final   Organism ID, Bacteria STAPHYLOCOCCUS AUREUS  Final      Susceptibility   Staphylococcus aureus - MIC*    CIPROFLOXACIN <=0.5 SENSITIVE Sensitive     ERYTHROMYCIN <=0.25 SENSITIVE Sensitive     GENTAMICIN <=0.5 SENSITIVE Sensitive     OXACILLIN <=0.25 SENSITIVE Sensitive     TETRACYCLINE <=1 SENSITIVE Sensitive     VANCOMYCIN <=0.5 SENSITIVE Sensitive     TRIMETH/SULFA <=10 SENSITIVE Sensitive     CLINDAMYCIN <=0.25 SENSITIVE Sensitive     RIFAMPIN <=0.5 SENSITIVE Sensitive     Inducible Clindamycin NEGATIVE Sensitive     * FEW STAPHYLOCOCCUS AUREUS  Culture, blood (Routine X 2) w Reflex to ID Panel     Status: None (  Preliminary result)   Collection Time: 06/04/19 12:16 AM   Specimen: BLOOD  Result Value Ref Range Status   Specimen Description BLOOD LEFT ANTECUBITAL  Final   Special Requests   Final    BOTTLES DRAWN AEROBIC AND ANAEROBIC Blood Culture adequate volume   Culture   Final    NO GROWTH 4 DAYS Performed at [facility], [address] [place], [place] [address]    Report Status PENDING  Incomplete  Culture, blood (Routine X 2) w Reflex to ID Panel     Status: None (Preliminary result)   Collection Time: 06/04/19 12:23 AM   Specimen: BLOOD  Result Value Ref Range Status   Specimen Description BLOOD BLOOD RIGHT WRIST  Final   Special Requests   Final    BOTTLES  DRAWN AEROBIC AND ANAEROBIC Blood Culture adequate volume   Culture   Final    NO GROWTH 4 DAYS Performed at [facility], [address], [place], [place] [address]    Report Status PENDING  Incomplete    RADIOLOGY:  [place] Ekg Site Rite  Result Date: 06/08/2019 If Site Rite image not attached, placement could not be confirmed due to current cardiac rhythm.    Management plans discussed with the patient, family and they are in agreement.  CODE STATUS: Full Code   TOTAL TIME TAKING CARE OF THIS PATIENT: 45 minutes.    Berna Spare Annmarie Plemmons M.D on 06/08/2019 at 8:29 AM  Between 7am to 6pm - Pager - [phone]  After 6pm go to [email or web address] - Technical [employer] [facility]  Office  [phone]  CC: Primary care physician; Ezequiel Kayser, MD   Note: This dictation was prepared with Dragon dictation along with smaller phrase technology. Any transcriptional errors that result from this process are unintentional.

## 2019-06-08 NOTE — TOC Progression Note (Signed)
Transition of Care [facility]) - Progression Note    Patient Details  Name: Ethan Alvarez MRN: [id number] Date of Birth: 07-29-1961  [facility] CM/SW Contact  Jarmel Linhardt, [name] Phone Number: [phone]  06/08/2019, 9:39 AM  Clinical Narrative: Clinical Social Worker (CSW) contacted [employer] at [place] and made him aware that patient's family has chosen to move him to a different facility and asked Liliane Channel to cancel his [facility] SNF authorization. Per Liliane Channel he will cancel the authorization for [facility]. Plan is for patient to D/C to [facility] pending [facility] SNF authorization. [facility] is also still pending. RN and MD aware of above.    Expected Discharge Plan: [place] Barriers to Discharge: Continued Medical Work up  Expected Discharge Plan and Services Expected Discharge Plan: [place] In-house Referral: Clinical Social Work Discharge Planning Services: CM Consult   Living arrangements for the past 2 months: [place], [place] Expected Discharge Date: 06/08/19                                     Social Determinants of Health (SDOH) Interventions    Readmission Risk Interventions Readmission Risk Prevention Plan 04/26/2019  Medication Screening Complete  Transportation Screening Complete  Some recent data might be hidden

## 2019-06-08 NOTE — Progress Notes (Signed)
Pastoral Care Visit   06/08/19 1830  Clinical Encounter Type  Visited With Patient  Visit Type Initial;Psychological support  Referral From Nurse  Consult/Referral To Chaplain  Recommendations chap f/u as available  Spiritual Encounters  Spiritual Needs Prayer;Emotional  Stress Factors  Patient Stress Factors Health changes   Pt was sitting up, alert, and smiling when chap entered room.  Pt shared with chap about his family and about how he broke his leg.  Pt requested chap pray for his leg to heal and asked for addtl visits.  Chap provided emotional support through active listening.  Melven Sartorius prayed for pt.  Pt was thankful for visit.  Darcey Nora, Chaplain

## 2019-06-08 NOTE — TOC Progression Note (Signed)
Transition of Care [facility]) - Progression Note    Patient Details  Name: Ethan Alvarez MRN: [id number] Date of Birth: 01/26/1961  [facility] CM/SW Contact  [name], [name] Phone Number: [phone]  06/08/2019, 11:38 AM  Clinical Narrative: [name] has been received, [id number] F expires 08/07/2019. Barrier to D/C is pending [facility] SNF authorization. Per [name] [facility] liaison she is working on [employer] authorization.       Expected Discharge Plan: [place] Barriers to Discharge: Continued Medical Work up  Expected Discharge Plan and Services Expected Discharge Plan: [place] In-house Referral: Clinical Social Work Discharge Planning Services: CM Consult   Living arrangements for the past 2 months: [place], [place] Expected Discharge Date: 06/08/19                                     Social Determinants of Health (SDOH) Interventions    Readmission Risk Interventions Readmission Risk Prevention Plan 04/26/2019  Medication Screening Complete  Transportation Screening Complete  Some recent data might be hidden

## 2019-06-08 NOTE — Progress Notes (Signed)
Subjective: 6 Days Post-Op Procedure(s) (LRB): REPAIR QUADRICEP TENDON (Right) INCISION AND DRAINAGE (Right) Patient reports pain as mild.   Patient is well, and has had no acute complaints or problems  Patient is very drowsy this morning but is able to open his eyes and answer questions appropriately. Plan is for discharge to rehab at this time. Negative for chest pain and shortness of breath Fever: No recent fevers. Gastrointestinal:Negative for nausea and vomiting  Objective: Vital signs in last 24 hours: Temp:  [98 F (36.7 C)-98.7 F (37.1 C)] 98 F (36.7 C) (06/21 2342) Pulse Rate:  [90-92] 92 (06/21 2342) Resp:  [16-20] 20 (06/21 2342) BP: (131-157)/(71-89) 138/78 (06/21 2342) SpO2:  [95 %-98 %] 95 % (06/21 2342)  Intake/Output from previous day:  Intake/Output Summary (Last 24 hours) at 06/08/2019 0736 Last data filed at 06/08/2019 0200 Gross per 24 hour  Intake 897.61 ml  Output 1100 ml  Net -202.39 ml    Intake/Output this shift: No intake/output data recorded.  Labs: Recent Labs    06/06/19 0501 06/07/19 0646  HGB 11.4* 12.0*   Recent Labs    06/06/19 0501 06/07/19 0646  WBC 5.1 4.6  RBC 3.63* 3.78*  HCT 34.9* 36.7*  PLT 152 166   Recent Labs    06/06/19 0501 06/07/19 0646 06/08/19 0456  NA 136 135  --   K 4.1 4.1  --   CL 93* 94*  --   CO2 33* 31  --   BUN 10 10  --   CREATININE 0.75 0.57* 0.67  GLUCOSE 112* 102*  --   CALCIUM 9.0 8.9  --    No results for input(s): LABPT, INR in the last 72 hours.   EXAM General - Patient is Alert, Appropriate and Oriented Extremity - Sensation intact distally Intact pulses distally Dorsiflexion/Plantar flexion intact Incision: dressing C/D/I   No erythema is noted to the right knee today, ecchymosis improving compared to last week when he was last evaluate by me. Dressing/Incision - ACE wrap and knee immobilizer still applied to the right leg. Motor Function - intact, moving foot and toes well on  exam.  Abdomen soft with normal BS.  Past Medical History:  Diagnosis Date  . Hypertension    Assessment/Plan: 6 Days Post-Op Procedure(s) (LRB): REPAIR QUADRICEP TENDON (Right) INCISION AND DRAINAGE (Right) Active Problems:   Cellulitis of right knee  Estimated body mass index is 37.86 kg/m as calculated from the following:   Height as of this encounter: 5\' 9"  (1.753 m).   Weight as of this encounter: 116.3 kg. Advance diet Up with therapy D/C IV fluids when tolerating po intake.  Labs reviewed this AM, WBC 4.6 yesterday. Culture from surgery demonstrated staph aureus, Infectious disease is following patient. Plan is for PICC placement today for continued Abx for 4-6 weeks. Remain in knee immobilizer at this time Will transition to a long leg cast prior to discharge today.  DVT Prophylaxis - Lovenox, Foot Pumps and TED hose Weight-Bearing as tolerated to right leg in the knee [place], PA-C [facility] 06/08/2019, 7:36 AM

## 2019-06-08 NOTE — TOC Progression Note (Signed)
Transition of Care [facility]) - Progression Note    Patient Details  Name: Ethan Alvarez MRN: [id number] Date of Birth: [birth year]  Transition of Care [facility]) CM/SW Contact  Clydene Burack, [name] Phone Number: [phone]  06/08/2019, 4:17 PM  Clinical Narrative: Per [facility] authorization has been received however they do not have bed on their isolation unit and they can't accept patient today. Per [name] they will have a bed open tomorrow and can accept patient tomorrow. Ortho nursing director approved for patient to have 1 visitor tomorrow while the sheriff's department is serving patient guardianship paper work. Patient's sister [name] is aware of above. Per [name] she will call CSW back with a time frame the visitor and deputy are coming to [facility] tomorrow. Charge RN aware of above. CSW will continue to follow and assist as needed.     Expected Discharge Plan: [place] Barriers to Discharge: Continued Medical Work up  Expected Discharge Plan and Services Expected Discharge Plan: [place] In-house Referral: Clinical Social Work Discharge Planning Services: CM Consult   Living arrangements for the past 2 months: [place], [place] Expected Discharge Date: 06/08/19                                     Social Determinants of Health (SDOH) Interventions    Readmission Risk Interventions Readmission Risk Prevention Plan 04/26/2019  Medication Screening Complete  Transportation Screening Complete  Some recent data might be hidden

## 2019-06-08 NOTE — Treatment Plan (Signed)
Diagnosis: Staph aureus bacteremia with septic arthritis rt knee/possible osteomyelitis with presence of foreign body Baseline Creatinine 0.67  Culture Result: MSSA( staph aureus)  Allergies  Allergen Reactions  . Haloperidol Lactate Other (See Comments)    Per [facility]; nature of intolerance unclear intolerance Other reaction(s): Other (See Comments) Per [facility]; nature of intolerance unclear     OPAT Orders Discharge antibiotics: Cefazolin 2 grams IV every 8 hours End Date: 07/16/19 ----------------------------------------------------- Rifampin 32m PO BID until 07/16/19  Watch closely for any interaction with rifampin and other meds ( anti-psychotic) and also watch for jaundice - check Liver function test weekly along with other labs listed below  [facility] Per Protocol:place biopatch  Labs weekly while on IV antibiotics: _X_ CBC with differential  _X_ CMP _X_ CRP _X_ ESR   _X_ Please pull PIC at completion of IV antibiotics  Fax weekly labs promptly  to ([phone] Clinic Follow Up Appt:4 weeks  Call [phone] make appt with Dr.Kentrell Hallahan -Infectious Disease or if you have any questions or concerns

## 2019-06-08 NOTE — Care Management Important Message (Signed)
Important Message  Patient Details  Name: RAHEIM BEUTLER MRN: [id number] Date of Birth: 1961/12/13   Medicare Important Message Given:  Yes     Juliann Pulse A Kel Senn 06/08/2019, 10:35 AM

## 2019-06-08 NOTE — Progress Notes (Signed)
PHARMACY CONSULT NOTE FOR:  OUTPATIENT  PARENTERAL ANTIBIOTIC THERAPY (OPAT)  Indication: MSSA bacteremia w/ septic arthritis with poss. Osteo w/ presence of foreign body Regimen:  Cefazolin 2 gm IV q8h End date: 07/16/2019  Rifampin 300mg  po bid also ordered  IV antibiotic discharge orders are pended. To discharging provider:  please sign these orders via discharge navigator,  Select New Orders & click on the button choice - Manage This Unsigned Work.     Thank you for allowing pharmacy to be a part of this patient's care.  Shyana Kulakowski A 06/08/2019, 8:41 AM

## 2019-06-08 NOTE — Progress Notes (Signed)
Peripherally Inserted Central Catheter/Midline Placement  The IV Nurse has discussed with the patient and/or persons authorized to consent for the patient, the purpose of this procedure and the potential benefits and risks involved with this procedure.  The benefits include less needle sticks, lab draws from the catheter, and the patient may be discharged home with the catheter. Risks include, but not limited to, infection, bleeding, blood clot (thrombus formation), and puncture of an artery; nerve damage and irregular heartbeat and possibility to perform a PICC exchange if needed/ordered by physician.  Alternatives to this procedure were also discussed.  Bard Power PICC patient education guide, fact sheet on infection prevention and patient information card has been provided to patient /or left at bedside.    PICC/Midline Placement Documentation  PICC Single Lumen 14/38/88 PICC Left Basilic 46 cm 0 cm (Active)  Indication for Insertion or Continuance of Line Prolonged intravenous therapies 06/08/19 1303  Exposed Catheter (cm) 0 cm 06/08/19 1303  Site Assessment Clean;Dry;Intact 06/08/19 1303  Line Status Flushed;Blood return noted 06/08/19 1303  Dressing Type Transparent 06/08/19 1303  Dressing Status Clean;Dry;Intact;Antimicrobial disc in place 06/08/19 1303  Dressing Intervention New dressing 06/08/19 1303  Dressing Change Due 06/15/19 06/08/19 1303    Telephone consent signed by POA sister    [name] 06/08/2019, 1:05 PM

## 2019-06-09 LAB — CULTURE, BLOOD (ROUTINE X 2)
Culture: NO GROWTH
Culture: NO GROWTH
Special Requests: ADEQUATE
Special Requests: ADEQUATE

## 2019-06-09 LAB — HEPATITIS PANEL, ACUTE
HCV Ab: 0.2 s/co ratio (ref 0.0–0.9)
Hep A IgM: NEGATIVE
Hep B C IgM: NEGATIVE
Hepatitis B Surface Ag: NEGATIVE

## 2019-06-09 MED ORDER — CEFAZOLIN IV (FOR PTA / DISCHARGE USE ONLY): 2.0000 g | Freq: Three times a day (TID) | INTRAVENOUS | 0 refills | Status: AC

## 2019-06-09 NOTE — TOC Transition Note (Signed)
Transition of Care [facility]) - CM/SW Discharge Note   Patient Details  Name: ARSHDEEP BOLGER MRN: [id number] Date of Birth: [birth year]  [facility]) CM/SW Contact:  Heiress Williamson, [name] Phone Number: [phone]  06/09/2019, 12:12 PM   Clinical Narrative: Sheriff's deputy served patient guardianship papers and patient's brother was at bedside. Patient is medically stable for D/C to [facility] today. PASARR has been received and [facility] authorization has been received. Per [name] [facility] liaison patient can come today to room 711. RN will call report and arrange EMS for transport. Clinical Education officer, museum (CSW) sent D/C orders to [facility] via [facility]. Patient's sister in law Alanda Slim is aware of above. Please reconsult if future social work needs arise. CSW signing off.     Final next level of care: Skilled Nursing Facility Barriers to Discharge: Barriers Resolved   Patient Goals and CMS Choice Patient states their goals for this hospitalization and ongoing recovery are:: For infection to get better.   Choice offered to / list presented to : [place] / Guardian  Discharge Placement PASRR number recieved: 06/08/19            Patient chooses bed at: [facility] [place] Patient to be transferred to facility by: [facility] Name of family member notified: Patient's sister in law Alanda Slim is aware of D/C today. Patient and family notified of of transfer: 06/09/19  Discharge Plan and Services In-house Referral: Clinical Social Work Discharge Planning Services: CM Consult                                 Social Determinants of Health (SDOH) Interventions     Readmission Risk Interventions Readmission Risk Prevention Plan 04/26/2019  Medication Screening Complete  Transportation Screening Complete  Some recent data might be hidden

## 2019-06-09 NOTE — Progress Notes (Signed)
I called to give [facility] report four times, the first time someone answered and hung up, last three times it rang then went to voicemail. Discharge information placed in packet for facility. Midline removed. PICC line still in place for continuing IV antibiotics. I am about to call transport at this time.

## 2019-06-09 NOTE — Progress Notes (Signed)
ID MSSA bacteremia Recent quad tendon repair for a rupture Infection of the surgical site/spetic arthritis and OM at the site of the anchors  Will be treated with cefazolin + Rifampin ( as Foreign body in the knee joint) X 6 weeks- 07/16/19 Follow up as OP in 4 weeks

## 2019-06-09 NOTE — Progress Notes (Signed)
Subjective: 7 Days Post-Op Procedure(s) (LRB): REPAIR QUADRICEP TENDON (Right) INCISION AND DRAINAGE (Right) Patient reports pain as mild.   Patient is well, and has had no acute complaints or problems  Patient is alert this AM. Long leg cast in place to the right leg. Plan is for discharge to rehab today. Negative for chest pain and shortness of breath Fever: No recent fevers. Gastrointestinal:Negative for nausea and vomiting  Objective: Vital signs in last 24 hours: Temp:  [97.5 F (36.4 C)-98.3 F (36.8 C)] 98 F (36.7 C) (06/23 1621) Pulse Rate:  [90-93] 90 (06/23 1621) Resp:  [18-20] 18 (06/23 1621) BP: (143-153)/(82-95) 143/82 (06/23 1621) SpO2:  [93 %-96 %] 94 % (06/23 1621)  Intake/Output from previous day:  Intake/Output Summary (Last 24 hours) at 06/09/2019 1630 Last data filed at 06/09/2019 1440 Gross per 24 hour  Intake 20 ml  Output 1630 ml  Net -1610 ml    Intake/Output this shift: Total I/O In: 20 [P.O.:20] Out: 600 [Urine:600]  Labs: Recent Labs    06/07/19 0646  HGB 12.0*   Recent Labs    06/07/19 0646  WBC 4.6  RBC 3.78*  HCT 36.7*  PLT 166   Recent Labs    06/07/19 0646 06/08/19 0456  NA 135  --   K 4.1  --   CL 94*  --   CO2 31  --   BUN 10  --   CREATININE 0.57* 0.67  GLUCOSE 102*  --   CALCIUM 8.9  --    No results for input(s): LABPT, INR in the last 72 hours.   EXAM General - Patient is Alert, Appropriate and Oriented Extremity - Sensation intact distally   Able to flex and extend toes without pain. Long leg cast in place. Motor Function - intact toes well on exam.  Abdomen soft with normal BS.  Past Medical History:  Diagnosis Date  . Hypertension    Assessment/Plan: 7 Days Post-Op Procedure(s) (LRB): REPAIR QUADRICEP TENDON (Right) INCISION AND DRAINAGE (Right) Active Problems:   Cellulitis of right knee  Estimated body mass index is 37.86 kg/m as calculated from the following:   Height as of this  encounter: 5\' 9"  (1.753 m).   Weight as of this encounter: 116.3 kg. Advance diet Up with therapy D/C IV fluids when tolerating po intake.  Continue with long leg cast at this time. Culture from surgery demonstrated staph aureus, Infectious disease is following patient. PICC line placed yesterday.  Continue with Cefazolin and Rifampin per infectious disease. Remain in long leg cast, follow-up in 7-8 days in the office for staple removal and repeat casting.  DVT Prophylaxis - Lovenox, Foot Pumps and TED hose Weight-Bearing as tolerated to right leg in the knee [place], PA-C [facility] 06/09/2019, 4:30 PM

## 2019-06-09 NOTE — Discharge Summary (Addendum)
[facility] NAME: Ethan Alvarez    MR#:  [id number]  DATE OF BIRTH:  13-May-1961  DATE OF ADMISSION:  06/01/2019   ADMITTING PHYSICIAN: Hillary Bow, MD  DATE OF DISCHARGE: 06/08/19  PRIMARY CARE PHYSICIAN: Ezequiel Kayser, MD   ADMISSION DIAGNOSIS:  Hypocalcemia [E83.51] Shortness of breath [R06.02] Altered mental status, unspecified altered mental status type [R41.82] DISCHARGE DIAGNOSIS:  Active Problems:   Cellulitis of right knee  SECONDARY DIAGNOSIS:   Past Medical History:  Diagnosis Date  . Hypertension    HOSPITAL COURSE:   Ethan Alvarez is a 58 year old male who presented to the ED with right knee pain, redness, and swelling after possible fall at home. Right knee x-ray showed an effusion. MRI showed complete rupture of the patellar tendon, possible septic arthritis, and patellar osteomyelitis. Patient was admitted for further management.  MSSA bacteremia secondary to right knee septic arthritis- initially meeting sepsis criteria, but this has resolved. -Repeat blood cultures with no growth -S/p I&D of right knee and primary repair of recurrent quadriceps tendon rupture on 6/16 -Patient placed in long leg cast prior to discharge -Seen by ID, who recommended Ancef 2g IV every 8 hours until 07/16/19 and Rifampin 360m PO BID until 07/16/19  -Patient needs the following weekly labs while on IV antibiotics: CBC with differential, CMP, CRP, ESR -PICC placed prior to discharge -Needs to f/u with ID and orthopedic surgery as an outpatient  Shortness of breath- likely secondary to underlying OSA. SOB has resolved.  -Patient weaned to room air -Monitor  History of schizophrenia- stable  -Continued Depakote, Zyprexa, Cogentin.  Essential hypertension- blood pressures normal  -Continued propranolol.  Depression- stable  -Continued Zoloft.  DISCHARGE CONDITIONS:  MSSA bacteremia Right knee septic arthritis  Schizophrenia Hypertension Depression CONSULTS OBTAINED:  Treatment Team:  Poggi, JMarshall Cork MD DRUG ALLERGIES:   Allergies  Allergen Reactions  . Haloperidol Lactate Other (See Comments)    Per [facility]; nature of intolerance unclear intolerance Other reaction(s): Other (See Comments) Per [facility]; nature of intolerance unclear    DISCHARGE MEDICATIONS:   Allergies as of 06/09/2019      Reactions   Haloperidol Lactate Other (See Comments)   Per [facility]; nature of intolerance unclear intolerance Other reaction(s): Other (See Comments) Per [facility]; nature of intolerance unclear      Medication List    TAKE these medications   acetaminophen 325 MG tablet Commonly known as: TYLENOL Take 650 mg by mouth every 4 (four) hours as needed for mild pain.   benztropine 0.5 MG tablet Commonly known as: COGENTIN Take 0.5 mg by mouth every morning.   benztropine 1 MG tablet Commonly known as: COGENTIN Take 1 mg by mouth 2 (two) times daily.   ceFAZolin  IVPB Commonly known as: ANCEF Inject 2 g into the vein every 8 (eight) hours. Indication: MSSA bacteremia with septic arthritis and possible osteomyelitis w/ presence of foreign body Last Day of Therapy:  07/16/2019 Labs weekly while on IV antibiotics: _X_ CBC with differential  _X_ CMP _X_ CRP _X_ ESR   _X_ Please pull PIC at completion of IV antibiotics  Fax weekly labs promptly  to ([phone] Follow Up Appt:4 weeks Call [phone] make appt with Dr.Ravishankar -Infectious Disease or if you have any questions or concerns   clonazePAM 0.5 MG tablet Commonly known as: KLONOPIN Take 0.5 mg by mouth 3 (three) times daily as needed for agitation.   divalproex 500  MG DR tablet Commonly known as: DEPAKOTE Take 1,000 mg by mouth 2 (two) times a day.   enoxaparin 40 MG/0.4ML injection Commonly known as: LOVENOX Inject 0.4 mLs (40 mg total) into the skin daily for 14 days.   Lorayne Bender Sustenna 234 MG/1.5ML Susy injection  Generic drug: paliperidone Inject 234 mg into the muscle every 30 (thirty) days.   ketoconazole 2 % cream Commonly known as: NIZORAL Apply 1 application topically. To groin and surrounding areas twice daily until resolved   LORazepam 0.5 MG tablet Commonly known as: ATIVAN Take 0.5 mg by mouth every morning.   LORazepam 0.5 MG tablet Commonly known as: ATIVAN Take 0.5 mg by mouth daily as needed for anxiety.   LORazepam 1 MG tablet Commonly known as: ATIVAN Take 1 mg by mouth 2 (two) times daily.   metroNIDAZOLE 0.75 % gel Commonly known as: METROGEL Apply 1 application topically daily.   OLANZapine 15 MG tablet Commonly known as: ZYPREXA Take 15 mg by mouth daily.   oxyCODONE 5 MG immediate release tablet Commonly known as: Oxy IR/ROXICODONE Take 1-2 tablets (5-10 mg total) by mouth every 4 (four) hours as needed for severe pain. What changed: reasons to take this   polyethylene glycol 17 g packet Commonly known as: MIRALAX / GLYCOLAX Take 17 g by mouth daily as needed for mild constipation.   propranolol 20 MG tablet Commonly known as: INDERAL Take 20 mg by mouth daily.   rifampin 300 MG capsule Commonly known as: RIFADIN Take 1 capsule (300 mg total) by mouth every 12 (twelve) hours.   sertraline 100 MG tablet Commonly known as: ZOLOFT Take 100 mg by mouth 2 (two) times a day.   traMADol 50 MG tablet Commonly known as: ULTRAM Take 1 tablet (50 mg total) by mouth every 6 (six) hours as needed for moderate pain.            Home Infusion Instuctions  (From admission, onward)         Start     Ordered   06/09/19 0000  Home infusion instructions [facility] May follow [place] Dosing Protocol; May administer Cathflo as needed to maintain patency of vascular access device.; Flushing of vascular access device: per [facility] Protocol: 0.9% NaCl pre/post medica...    Question Answer Comment  Instructions May follow [place] Dosing Protocol    Instructions May administer Cathflo as needed to maintain patency of vascular access device.   Instructions Flushing of vascular access device: per [facility] Protocol: 0.9% NaCl pre/post medication administration and prn patency; Heparin 100 u/ml, 59m for implanted ports and Heparin 10u/ml, 578mfor all other central venous catheters.   Instructions May follow [facility] Anaphylaxis Protocol for First Dose Administration in the home: 0.9% NaCl at 25-50 ml/hr to maintain IV access for protocol meds. Epinephrine 0.3 ml IV/IM PRN and Benadryl 25-50 IV/IM PRN s/s of anaphylaxis.   Instructions [facility] Infusion Coordinator (RN) to assist per patient IV care needs in the home PRN.      06/09/19 0704           DISCHARGE INSTRUCTIONS:  1. F/u with PCP in 5 days 2. F/u with ortho in 2 weeks 3. F/u with ID in 4 weeks 4. Continue IV ancef and PO Rifampin until 07/16/19 5. Needs the following labs while on IV abx- CBC w/ diff, CMP, ESR, CRP DIET:  Regular diet and Cardiac diet DISCHARGE CONDITION:  Stable ACTIVITY:  WBAT to right leg OXYGEN:  Home Oxygen: No.  Oxygen Delivery: room air DISCHARGE LOCATION:  nursing home   If you experience worsening of your admission symptoms, develop shortness of breath, life threatening emergency, suicidal or homicidal thoughts you must seek medical attention immediately by calling 911 or calling your MD immediately  if symptoms less severe.  You Must read complete instructions/literature along with all the possible adverse reactions/side effects for all the Medicines you take and that have been prescribed to you. Take any new Medicines after you have completely understood and accpet all the possible adverse reactions/side effects.   Please note  You were cared for by a hospitalist during your hospital stay. If you have any questions about your discharge medications or the care you received while you were in the hospital after you are discharged, you can call  the unit and asked to speak with the hospitalist on call if the hospitalist that took care of you is not available. Once you are discharged, your primary care physician will handle any further medical issues. Please note that NO REFILLS for any discharge medications will be authorized once you are discharged, as it is imperative that you return to your primary care physician (or establish a relationship with a primary care physician if you do not have one) for your aftercare needs so that they can reassess your need for medications and monitor your lab values.    On the day of Discharge:  VITAL SIGNS:  Blood pressure (!) 153/91, pulse 92, temperature (!) 97.5 F (36.4 C), temperature source Oral, resp. rate 18, height 5' 9"  (1.753 m), weight 116.3 kg, SpO2 96 %. PHYSICAL EXAMINATION:  GENERAL:  58 y.o.-year-old patient lying in the bed with no acute distress.  EYES: Pupils equal, round, reactive to light and accommodation. No scleral icterus. Extraocular muscles intact.  HEENT: Head atraumatic, normocephalic. Oropharynx and nasopharynx clear.  NECK:  Supple, no jugular venous distention. No thyroid enlargement, no tenderness.  LUNGS: Normal breath sounds bilaterally, no wheezing, rales,rhonchi or crepitation. No use of accessory muscles of respiration.  CARDIOVASCULAR: S1, S2 normal. No murmurs, rubs, or gallops.  ABDOMEN: Soft, non-tender, non-distended. Bowel sounds present. No organomegaly or mass.  EXTREMITIES: No pedal edema, cyanosis, or clubbing. +long cast in place over right leg. +PICC in RUE. NEUROLOGIC: Cranial nerves II through XII are intact.+global weakness, unable to assess strength in right leg. Sensation intact. Gait not checked.  PSYCHIATRIC: The patient is alert and oriented x 3.  SKIN: No obvious rash, lesion, or ulcer.  DATA REVIEW:   CBC Recent Labs  Lab 06/07/19 0646  WBC 4.6  HGB 12.0*  HCT 36.7*  PLT 166    Chemistries  Recent Labs  Lab 06/06/19 0501  06/07/19 0646 06/08/19 0456  NA 136 135  --   K 4.1 4.1  --   CL 93* 94*  --   CO2 33* 31  --   GLUCOSE 112* 102*  --   BUN 10 10  --   CREATININE 0.75 0.57* 0.67  CALCIUM 9.0 8.9  --   AST 14*  --   --   ALT 12  --   --   ALKPHOS 47  --   --   BILITOT 0.4  --   --      Microbiology Results  Results for orders placed or performed during the hospital encounter of 06/01/19  Blood culture (routine x 2)     Status: Abnormal   Collection Time: 06/01/19  3:45 PM  Specimen: BLOOD  Result Value Ref Range Status   Specimen Description   Final    BLOOD LEFT ANTECUBITAL Performed at [facility], [address] [place], [place] [address]    Special Requests   Final    BOTTLES DRAWN AEROBIC AND ANAEROBIC Blood Culture results may not be optimal due to an excessive volume of blood received in culture bottles Performed at [facility], [address], [place], [place] [address]    Culture  Setup Time   Final    GRAM POSITIVE COCCI ANAEROBIC BOTTLE ONLY CRITICAL RESULT CALLED TO, READ BACK BY AND VERIFIED WITH: Rayna Sexton AT 1000 06/02/2019 SDR Performed at [facility], [place] [address], [place], [place] [address]    Culture STAPHYLOCOCCUS AUREUS (A)  Final   Report Status 06/04/2019 FINAL  Final   Organism ID, Bacteria STAPHYLOCOCCUS AUREUS  Final      Susceptibility   Staphylococcus aureus - MIC*    CIPROFLOXACIN <=0.5 SENSITIVE Sensitive     ERYTHROMYCIN <=0.25 SENSITIVE Sensitive     GENTAMICIN <=0.5 SENSITIVE Sensitive     OXACILLIN <=0.25 SENSITIVE Sensitive     TETRACYCLINE <=1 SENSITIVE Sensitive     VANCOMYCIN 1 SENSITIVE Sensitive     TRIMETH/SULFA <=10 SENSITIVE Sensitive     CLINDAMYCIN <=0.25 SENSITIVE Sensitive     RIFAMPIN <=0.5 SENSITIVE Sensitive     Inducible Clindamycin NEGATIVE Sensitive     * STAPHYLOCOCCUS AUREUS  Blood Culture ID Panel (Reflexed)     Status: Abnormal   Collection Time: 06/01/19  3:45 PM  Result Value Ref  Range Status   Enterococcus species NOT DETECTED NOT DETECTED Final   Listeria monocytogenes NOT DETECTED NOT DETECTED Final   Staphylococcus species DETECTED (A) NOT DETECTED Final    Comment: CRITICAL RESULT CALLED TO, READ BACK BY AND VERIFIED WITH:  Rayna Sexton AT 1000 06/02/2019 SDR    Staphylococcus aureus (BCID) DETECTED (A) NOT DETECTED Final    Comment: Methicillin (oxacillin) susceptible Staphylococcus aureus (MSSA). Preferred therapy is anti staphylococcal beta lactam antibiotic (Cefazolin or Nafcillin), unless clinically contraindicated. CRITICAL RESULT CALLED TO, READ BACK BY AND VERIFIED WITH:  Rayna Sexton AT 1000 06/02/2019 SDR    Methicillin resistance NOT DETECTED NOT DETECTED Final   Streptococcus species NOT DETECTED NOT DETECTED Final   Streptococcus agalactiae NOT DETECTED NOT DETECTED Final   Streptococcus pneumoniae NOT DETECTED NOT DETECTED Final   Streptococcus pyogenes NOT DETECTED NOT DETECTED Final   Acinetobacter baumannii NOT DETECTED NOT DETECTED Final   Enterobacteriaceae species NOT DETECTED NOT DETECTED Final   Enterobacter cloacae complex NOT DETECTED NOT DETECTED Final   Escherichia coli NOT DETECTED NOT DETECTED Final   Klebsiella oxytoca NOT DETECTED NOT DETECTED Final   Klebsiella pneumoniae NOT DETECTED NOT DETECTED Final   Proteus species NOT DETECTED NOT DETECTED Final   Serratia marcescens NOT DETECTED NOT DETECTED Final   Haemophilus influenzae NOT DETECTED NOT DETECTED Final   Neisseria meningitidis NOT DETECTED NOT DETECTED Final   Pseudomonas aeruginosa NOT DETECTED NOT DETECTED Final   Candida albicans NOT DETECTED NOT DETECTED Final   Candida glabrata NOT DETECTED NOT DETECTED Final   Candida krusei NOT DETECTED NOT DETECTED Final   Candida parapsilosis NOT DETECTED NOT DETECTED Final   Candida tropicalis NOT DETECTED NOT DETECTED Final    Comment: Performed at [facility], [address], [place], [place] [address]  SARS  Coronavirus 2 (CEPHEID- Performed in [place] hospital lab), [facility]  Status: None   Collection Time: 06/01/19  5:47 PM   Specimen: Nasopharyngeal Swab  Result Value Ref Range Status   SARS Coronavirus 2 NEGATIVE NEGATIVE Final    Comment: (NOTE) If result is NEGATIVE SARS-CoV-2 target nucleic acids are NOT DETECTED. The SARS-CoV-2 RNA is generally detectable in upper and lower  respiratory specimens during the acute phase of infection. The lowest  concentration of SARS-CoV-2 viral copies this assay can detect is 250  copies / mL. A negative result does not preclude SARS-CoV-2 infection  and should not be used as the sole basis for treatment or other  patient management decisions.  A negative result may occur with  improper specimen collection / handling, submission of specimen other  than nasopharyngeal swab, presence of viral mutation(s) within the  areas targeted by this assay, and inadequate number of viral copies  (<250 copies / mL). A negative result must be combined with clinical  observations, patient history, and epidemiological information. If result is POSITIVE SARS-CoV-2 target nucleic acids are DETECTED. The SARS-CoV-2 RNA is generally detectable in upper and lower  respiratory specimens dur ing the acute phase of infection.  Positive  results are indicative of active infection with SARS-CoV-2.  Clinical  correlation with patient history and other diagnostic information is  necessary to determine patient infection status.  Positive results do  not rule out bacterial infection or co-infection with other viruses. If result is PRESUMPTIVE POSTIVE SARS-CoV-2 nucleic acids MAY BE PRESENT.   A presumptive positive result was obtained on the submitted specimen  and confirmed on repeat testing.  While 2019 novel coronavirus  (SARS-CoV-2) nucleic acids may be present in the submitted sample  additional confirmatory testing may be necessary for epidemiological  and / or  clinical management purposes  to differentiate between  SARS-CoV-2 and other Sarbecovirus currently known to infect humans.  If clinically indicated additional testing with an alternate test  methodology [phone]) is advised. The SARS-CoV-2 RNA is generally  detectable in upper and lower respiratory sp ecimens during the acute  phase of infection. The expected result is Negative. Fact Sheet for Patients:  StrictlyIdeas.no Fact Sheet for Healthcare Providers: BankingDealers.co.za This test is not yet approved or cleared by the [place] FDA and has been authorized for detection and/or diagnosis of SARS-CoV-2 by FDA under an Emergency Use Authorization (EUA).  This EUA will remain in effect (meaning this test can be used) for the duration of the COVID-19 declaration under Section 564(b)(1) of the Act, 21 U.S.C. section 360bbb-3(b)(1), unless the authorization is terminated or revoked sooner. Performed at [facility], [address] [place], [place] [address]   Blood culture (routine x 2)     Status: None   Collection Time: 06/01/19 10:17 PM   Specimen: BLOOD  Result Value Ref Range Status   Specimen Description BLOOD BLOOD RIGHT HAND  Final   Special Requests   Final    BOTTLES DRAWN AEROBIC AND ANAEROBIC Blood Culture adequate volume   Culture   Final    NO GROWTH 5 DAYS Performed at [facility], [address], [place], [place] [address]    Report Status 06/06/2019 FINAL  Final  Aerobic/Anaerobic Culture (surgical/deep wound)     Status: None   Collection Time: 06/02/19  4:03 PM   Specimen: Wound  Result Value Ref Range Status   Specimen Description   Final    WOUND Performed at [facility], [address] [place], [place] [address]  Special Requests RIGHT KNEE JOINT  Final   Gram Stain NO WBC SEEN NO ORGANISMS SEEN   Final   Culture   Final    FEW STAPHYLOCOCCUS AUREUS NO ANAEROBES  ISOLATED Performed at [facility], [address] [place], [place] [address]    Report Status 06/07/2019 FINAL  Final   Organism ID, Bacteria STAPHYLOCOCCUS AUREUS  Final      Susceptibility   Staphylococcus aureus - MIC*    CIPROFLOXACIN <=0.5 SENSITIVE Sensitive     ERYTHROMYCIN <=0.25 SENSITIVE Sensitive     GENTAMICIN <=0.5 SENSITIVE Sensitive     OXACILLIN <=0.25 SENSITIVE Sensitive     TETRACYCLINE <=1 SENSITIVE Sensitive     VANCOMYCIN <=0.5 SENSITIVE Sensitive     TRIMETH/SULFA <=10 SENSITIVE Sensitive     CLINDAMYCIN <=0.25 SENSITIVE Sensitive     RIFAMPIN <=0.5 SENSITIVE Sensitive     Inducible Clindamycin NEGATIVE Sensitive     * FEW STAPHYLOCOCCUS AUREUS  Culture, blood (Routine X 2) w Reflex to ID Panel     Status: None (Preliminary result)   Collection Time: 06/04/19 12:16 AM   Specimen: BLOOD  Result Value Ref Range Status   Specimen Description BLOOD LEFT ANTECUBITAL  Final   Special Requests   Final    BOTTLES DRAWN AEROBIC AND ANAEROBIC Blood Culture adequate volume   Culture   Final    NO GROWTH 4 DAYS Performed at [facility], [address], [place], [place] [address]    Report Status PENDING  Incomplete  Culture, blood (Routine X 2) w Reflex to ID Panel     Status: None (Preliminary result)   Collection Time: 06/04/19 12:23 AM   Specimen: BLOOD  Result Value Ref Range Status   Specimen Description BLOOD BLOOD RIGHT WRIST  Final   Special Requests   Final    BOTTLES DRAWN AEROBIC AND ANAEROBIC Blood Culture adequate volume   Culture   Final    NO GROWTH 4 DAYS Performed at [facility], [address] [place], [place] [address]    Report Status PENDING  Incomplete    RADIOLOGY:  [place] Ekg Site Rite  Result Date: 06/08/2019 If Site Rite image not attached, placement could not be confirmed due to current cardiac rhythm.    Management plans discussed with the patient, family and they are in agreement.  CODE STATUS: Full  Code   TOTAL TIME TAKING CARE OF THIS PATIENT: 45 minutes.    Berna Spare Frisco Cordts M.D on 06/09/2019 at 7:05 AM  Between 7am to 6pm - Pager - [phone]  After 6pm go to [email or web address] - Technical brewer  Hospitalists  Office  [phone]  CC: Primary care physician; Ezequiel Kayser, MD   Note: This dictation was prepared with Dragon dictation along with smaller phrase technology. Any transcriptional errors that result from this process are unintentional.

## 2019-06-16 LAB — CULTURE, BLOOD (ROUTINE X 2)

## 2019-07-09 ENCOUNTER — Ambulatory Visit: Payer: Medicare Other | Attending: Infectious Diseases | Admitting: Infectious Diseases

## 2019-07-09 ENCOUNTER — Other Ambulatory Visit: Payer: Self-pay

## 2019-07-09 ENCOUNTER — Encounter: Payer: Self-pay | Admitting: Infectious Diseases

## 2019-07-09 VITALS — BP 122/84 | HR 93 | Temp 98.0°F | Ht 72.0 in

## 2019-07-09 DIAGNOSIS — M00061 Staphylococcal arthritis, right knee: Secondary | ICD-10-CM

## 2019-07-09 DIAGNOSIS — Z95828 Presence of other vascular implants and grafts: Secondary | ICD-10-CM

## 2019-07-09 DIAGNOSIS — R7881 Bacteremia: Secondary | ICD-10-CM | POA: Diagnosis not present

## 2019-07-09 DIAGNOSIS — Z79899 Other long term (current) drug therapy: Secondary | ICD-10-CM

## 2019-07-09 DIAGNOSIS — B9561 Methicillin susceptible Staphylococcus aureus infection as the cause of diseases classified elsewhere: Secondary | ICD-10-CM | POA: Diagnosis not present

## 2019-07-09 DIAGNOSIS — M009 Pyogenic arthritis, unspecified: Secondary | ICD-10-CM | POA: Insufficient documentation

## 2019-07-09 DIAGNOSIS — T8149XD Infection following a procedure, other surgical site, subsequent encounter: Secondary | ICD-10-CM

## 2019-07-09 DIAGNOSIS — M6518 Other infective (teno)synovitis, other site: Secondary | ICD-10-CM

## 2019-07-09 DIAGNOSIS — I10 Essential (primary) hypertension: Secondary | ICD-10-CM

## 2019-07-09 DIAGNOSIS — Z888 Allergy status to other drugs, medicaments and biological substances status: Secondary | ICD-10-CM

## 2019-07-09 DIAGNOSIS — R4183 Borderline intellectual functioning: Secondary | ICD-10-CM

## 2019-07-09 DIAGNOSIS — F209 Schizophrenia, unspecified: Secondary | ICD-10-CM

## 2019-07-09 NOTE — Progress Notes (Signed)
NAME: Ethan Alvarez  DOB: 04-26-1961  MRN: [id number]  Date/Time: 07/09/2019 9:21 AM   Subjective:   ?Patient here for follow-up after recent hospitalization.  He is currently on IV cefazolin for staph aureus bacteremia secondary to an infected right quadriceps tendon repair. Ethan Alvarez is a 59 y.o. male with a history of schizophrenia, borderline intellectual functioning, hypertension .  Patient initially presented to [facility] on 04/25/2019 after a fall and found to have rupture of the right quadriceps tendon.  He was taken to the OR by Dr. Roland Rack  and had primary repair of the right quadriceps tendon.  There were 3 titanium anchors placed in the tendon.  He was discharged on 04/28/2019.  He presented to the ED on 06/01/2019 with shortness of breath.  He was found to have staph aureus bacteremia.  And also the right knee was found to have swelling.  There was a concern for septic arthritis.  An MRI showed a large joint effusion with septation and debris.  He was taken to the OR by Dr. Roland Rack and underwent  Irrigation and debridement with primary repair of recurrent right quadriceps tendon rupture.  As per his OR note   a substantial synovectomy was performed.  The 3 metallic anchors placed previously were removed from the superior pole of the patella.  And the lateral anchor site did have infection around it as the bone was soft.  Anchor sites were curetted before the knee was irrigated.  The degenerative and avascular margin of the quadriceps tendon were debrided back to clean margin.  And then utilizing the 3 previous anchor holes a 2.5 mm drill was used to make a tunnel through the patella and at the inferior pole.  Four #5 FiberWire's were passed up through the central tunnel.  Each of the sutures were then woven in a modified Bennell fashion through the quadriceps tendon while maintaining the knee in extension before tying them securely.  Patient was on IV antibiotics in the hospital and was discharged to  be on cefazolin plus rifampin to complete 6 weeks of treatment on 07/16/2019.  He is here for follow-up.  He still has a right leg cast.  I reviewed the labs from the nursing home.  His white count has been slowly declining at discharge it was 4.7 and now from 07/06/2027 is 2.7. The LFTs have also increased.  The ALT was 55 and AST was 88.  His last ESR was 45 and CRP 0.95.  The patient is currently on propanolol, Zoloft, Zyprexa, Invega, benztropine PRN tramadol, as needed Ativan, Depakote.   Past medical history Acute pancreatitis Schizophrenia  borderline intellectual functioning History of adenomatous polyp of colon 10/07/2014 Hyperprolactinemia secondary to medication Hypertension OCD Rosacea Thrombocytopenia due to medication.  Past surgical history Recent right quadriceps tendon repair  Social history  single Non-smoker No drug use No alcohol?    No family history on file.    Allergies  Allergen Reactions  . Haloperidol Lactate Other (See Comments)    Per [facility]; nature of intolerance unclear intolerance Other reaction(s): Other (See Comments) Per [facility]; nature of intolerance unclear     ? Current Outpatient Medications  Medication Sig Dispense Refill  . benztropine (COGENTIN) 0.5 MG tablet Take 0.5 mg by mouth every morning.     . benztropine (COGENTIN) 1 MG tablet Take 1 mg by mouth 2 (two) times daily.     . divalproex (DEPAKOTE) 500 MG DR tablet Take 1,000 mg by mouth 2 (two)  times a day.    Lorayne Bender SUSTENNA 234 MG/1.5ML SUSY injection Inject 234 mg into the muscle every 30 (thirty) days.    Marland Kitchen ketoconazole (NIZORAL) 2 % cream Apply 1 application topically. To groin and surrounding areas twice daily until resolved    . LORazepam (ATIVAN) 0.5 MG tablet Take 0.5 mg by mouth every morning.     Marland Kitchen LORazepam (ATIVAN) 0.5 MG tablet Take 0.5 mg by mouth daily as needed for anxiety.    Marland Kitchen LORazepam (ATIVAN) 1 MG tablet Take 1 mg by mouth 2 (two) times daily.     .  metroNIDAZOLE (METROGEL) 0.75 % gel Apply 1 application topically daily.    Marland Kitchen OLANZapine (ZYPREXA) 15 MG tablet Take 15 mg by mouth daily.    Marland Kitchen oxyCODONE (OXY IR/ROXICODONE) 5 MG immediate release tablet Take 1-2 tablets (5-10 mg total) by mouth every 4 (four) hours as needed for severe pain. 30 tablet 0  . polyethylene glycol (MIRALAX / GLYCOLAX) 17 g packet Take 17 g by mouth daily as needed for mild constipation. 14 each 0  . propranolol (INDERAL) 20 MG tablet Take 20 mg by mouth daily.     . rifampin (RIFADIN) 300 MG capsule Take 1 capsule (300 mg total) by mouth every 12 (twelve) hours. 76 capsule 0  . sertraline (ZOLOFT) 100 MG tablet Take 100 mg by mouth 2 (two) times a day.    . traMADol (ULTRAM) 50 MG tablet Take 1 tablet (50 mg total) by mouth every 6 (six) hours as needed for moderate pain. 30 tablet 1  . acetaminophen (TYLENOL) 325 MG tablet Take 650 mg by mouth every 4 (four) hours as needed for mild pain.    Marland Kitchen ceFAZolin (ANCEF) IVPB Inject 2 g into the vein every 8 (eight) hours. Indication: MSSA bacteremia with septic arthritis and possible osteomyelitis w/ presence of foreign body Last Day of Therapy:  07/16/2019 Labs weekly while on IV antibiotics: _X_ CBC with differential  _X_ CMP _X_ CRP _X_ ESR   _X_ Please pull PIC at completion of IV antibiotics  Fax weekly labs promptly  to [phone] Clinic Follow Up Appt:4 weeks Call [phone] to make appt with Dr.Nadalee Neiswender -Infectious Disease or if you have any questions or concerns 114 Units 0  . clonazePAM (KLONOPIN) 0.5 MG tablet Take 0.5 mg by mouth 3 (three) times daily as needed for agitation.    . enoxaparin (LOVENOX) 40 MG/0.4ML injection Inject 0.4 mLs (40 mg total) into the skin daily for 14 days. 5.6 mL 0   No current facility-administered medications for this visit.      Abtx:  Anti-infectives (From admission, onward)   None      REVIEW OF SYSTEMS: says he wants the cast out Const: negative  fever, negative chills, negative weight loss Eyes: negative diplopia or visual changes, negative eye pain ENT: negative coryza, negative sore throat Resp: negative cough, hemoptysis, dyspnea Cards: negative for chest pain, palpitations, lower extremity edema GU: negative for frequency, dysuria and hematuria GI: Negative for abdominal pain, diarrhea, bleeding, constipation Skin:itching  Heme: negative for easy bruising and gum/nose bleeding MS: negative for myalgias, arthralgias, back pain and muscle weakness Neurolo:negative for headaches, dizziness, vertigo, memory problems  Psych:anxiety  Allergy/Immunology- as noted ? Pertinent Positives include : Objective:  VITALS:  BP 122/84 (BP Location: Right Arm, Patient Position: Sitting, Cuff Size: Normal)   Pulse 93   Temp 98 F (36.7 C) (Oral)   Ht 6' (1.829 m)   BMI  34.77 kg/m  PHYSICAL EXAM:  General: Alert, anxious in a wheelchair with his right leg in extension Head: Normocephalic, without obvious abnormality, atraumatic. Eyes: Conjunctivae clear, anicteric sclerae. Pupils are equal ENT not examined due to mask Lungs: Clear to auscultation bilaterally. No Wheezing or Rhonchi. No rales. Heart: Regular rate and rhythm, no murmur, rub or gallop. Abdomen: Did not examine Extremities: Right leg in cast, left PIC line.  The dressing over the line is lifting up Skin: Limited examination healed excoriation  over the arms lymph: Cervical, supraclavicular normal. Neurologic: Grossly non-focal Pertinent Labs Lab Results As listed above   IMAGING RESULTS: None to review ? Impression/Recommendation 58 year old male with history of schizophrenia, hypertension is here for follow-up after recent hospital discharge.  Staph aureus bacteremia secondary to infected right quadriceps tendon and right septic knee arthritis and possible osteomyelitis of the patellar bone at the site of the anchor. Patient has had 38 days of IV cefazolin and  p.o. rifampin. The plan was to give it to him until 7/30.  But because of decreasing leukocytes and slightly increasing LFTs we will stop both IV cefazolin and p.o. rifampin. The PICC line will have to be removed We will give him doxycycline 100 mg p.o. twice daily for 2 weeks. We will asked the nursing home to repeat his labs next week.  ? ? ?Patient is very anxious and because of his underlying schizophrenia unable to understand the situation well. Sent a written note to his nursing home. My office staff spoke with his nurse at the nursing home and updated the plan as well.. Note:  This document was prepared using Dragon voice recognition software and may include unintentional dictation errors.

## 2019-07-09 NOTE — Patient Instructions (Signed)
You are here for follow up after recent recent hospitalization for staph aureus bacteremia and infection of the right knee area from an infected tendon.  He has been on intravenous antibiotics cefazolin 2 g 3 times a day and oral rifampin 300 mg twice a day for the 38 days.  The infection has resolved.  The initial plan was to continue on 2 07/16/2019 for another week.  But because of increasing white count abnormal liver function tests plan is to stop both antibiotics today and remove the PICC line. We will put you on tablet doxycycline 100 mg tablet 1 in the morning and 1 in the evening for 2 weeks.  Please follow-up with Dr. Roland Rack for the cast removal.

## 2019-07-10 ENCOUNTER — Telehealth: Payer: Self-pay | Admitting: Licensed Clinical Social Worker

## 2019-07-10 NOTE — Telephone Encounter (Signed)
Spoke with Linda,RN at [facility] and gave verbal order to check cmp, cbc on 7/27 or 7/28. RN states she will fax results to [place]

## 2019-09-09 ENCOUNTER — Ambulatory Visit
Admission: RE | Admit: 2019-09-09 | Discharge: 2019-09-09 | Disposition: A | Discharge status: Home / Self Care | Payer: Medicare Other | Source: Ambulatory Visit | Attending: Student | Admitting: Student

## 2019-09-09 ENCOUNTER — Other Ambulatory Visit: Payer: Self-pay

## 2019-09-09 ENCOUNTER — Other Ambulatory Visit: Payer: Self-pay | Admitting: Student

## 2019-09-09 DIAGNOSIS — M7989 Other specified soft tissue disorders: Secondary | ICD-10-CM | POA: Insufficient documentation

## 2019-09-10 ENCOUNTER — Inpatient Hospital Stay
Admission: EM | Admit: 2019-09-10 | Discharge: 2019-09-15 | DRG: 948 | Disposition: A | Discharge status: Skilled Nursing Facility | Payer: Medicare Other | Attending: Internal Medicine | Admitting: Internal Medicine

## 2019-09-10 ENCOUNTER — Emergency Department: Payer: Medicare Other

## 2019-09-10 ENCOUNTER — Other Ambulatory Visit: Payer: Self-pay

## 2019-09-10 DIAGNOSIS — M25561 Pain in right knee: Secondary | ICD-10-CM | POA: Diagnosis not present

## 2019-09-10 DIAGNOSIS — Z86718 Personal history of other venous thrombosis and embolism: Secondary | ICD-10-CM | POA: Diagnosis not present

## 2019-09-10 DIAGNOSIS — Z79899 Other long term (current) drug therapy: Secondary | ICD-10-CM

## 2019-09-10 DIAGNOSIS — R531 Weakness: Principal | ICD-10-CM | POA: Diagnosis present

## 2019-09-10 DIAGNOSIS — R2681 Unsteadiness on feet: Secondary | ICD-10-CM | POA: Diagnosis present

## 2019-09-10 DIAGNOSIS — Y92009 Unspecified place in unspecified non-institutional (private) residence as the place of occurrence of the external cause: Secondary | ICD-10-CM

## 2019-09-10 DIAGNOSIS — F79 Unspecified intellectual disabilities: Secondary | ICD-10-CM | POA: Diagnosis present

## 2019-09-10 DIAGNOSIS — S0081XA Abrasion of other part of head, initial encounter: Secondary | ICD-10-CM | POA: Diagnosis present

## 2019-09-10 DIAGNOSIS — R45851 Suicidal ideations: Secondary | ICD-10-CM | POA: Diagnosis not present

## 2019-09-10 DIAGNOSIS — I10 Essential (primary) hypertension: Secondary | ICD-10-CM | POA: Diagnosis not present

## 2019-09-10 DIAGNOSIS — F209 Schizophrenia, unspecified: Secondary | ICD-10-CM | POA: Diagnosis present

## 2019-09-10 DIAGNOSIS — Z20828 Contact with and (suspected) exposure to other viral communicable diseases: Secondary | ICD-10-CM | POA: Diagnosis present

## 2019-09-10 DIAGNOSIS — W19XXXA Unspecified fall, initial encounter: Secondary | ICD-10-CM | POA: Diagnosis not present

## 2019-09-10 DIAGNOSIS — R296 Repeated falls: Secondary | ICD-10-CM | POA: Diagnosis present

## 2019-09-10 LAB — COMPREHENSIVE METABOLIC PANEL
ALT: 10 U/L (ref 0–44)
AST: 17 U/L (ref 15–41)
Albumin: 3.7 g/dL (ref 3.5–5.0)
Alkaline Phosphatase: 65 U/L (ref 38–126)
Anion gap: 11 (ref 5–15)
BUN: 11 mg/dL (ref 6–20)
CO2: 27 mmol/L (ref 22–32)
Calcium: 8.9 mg/dL (ref 8.9–10.3)
Chloride: 99 mmol/L (ref 98–111)
Creatinine, Ser: 0.69 mg/dL (ref 0.61–1.24)
GFR calc Af Amer: >60 mL/min (ref >60)
GFR calc non Af Amer: >60 mL/min (ref >60)
Glucose, Bld: 91 mg/dL (ref 70–99)
Potassium: 4.3 mmol/L (ref 3.5–5.1)
Sodium: 137 mmol/L (ref 135–145)
Total Bilirubin: 0.7 mg/dL (ref 0.3–1.2)
Total Protein: 7.7 g/dL (ref 6.5–8.1)

## 2019-09-10 LAB — CBC WITH DIFFERENTIAL/PLATELET
Abs Immature Granulocytes: 0.12 10*3/uL — ABNORMAL HIGH (ref 0.00–0.07)
Basophils Absolute: 0 10*3/uL (ref 0.0–0.1)
Basophils Relative: 1 %
Eosinophils Absolute: 0.2 10*3/uL (ref 0.0–0.5)
Eosinophils Relative: 3 %
HCT: 41.9 % (ref 39.0–52.0)
Hemoglobin: 13.9 g/dL (ref 13.0–17.0)
Immature Granulocytes: 2 %
Lymphocytes Relative: 15 %
Lymphs Abs: 0.9 10*3/uL (ref 0.7–4.0)
MCH: 32 pg (ref 26.0–34.0)
MCHC: 33.2 g/dL (ref 30.0–36.0)
MCV: 96.5 fL (ref 80.0–100.0)
Monocytes Absolute: 0.9 10*3/uL (ref 0.1–1.0)
Monocytes Relative: 14 %
Neutro Abs: 4.2 10*3/uL (ref 1.7–7.7)
Neutrophils Relative %: 65 %
Platelets: 127 10*3/uL — ABNORMAL LOW (ref 150–400)
RBC: 4.34 MIL/uL (ref 4.22–5.81)
RDW: 13.4 % (ref 11.5–15.5)
WBC: 6.3 10*3/uL (ref 4.0–10.5)
nRBC: 0 % (ref 0.0–0.2)

## 2019-09-10 LAB — URINALYSIS, COMPLETE (UACMP) WITH MICROSCOPIC
Bacteria, UA: NONE SEEN
Bilirubin Urine: NEGATIVE
Glucose, UA: NEGATIVE mg/dL
Hgb urine dipstick: NEGATIVE
Ketones, ur: NEGATIVE mg/dL
Leukocytes,Ua: NEGATIVE
Nitrite: NEGATIVE
Protein, ur: NEGATIVE mg/dL
Specific Gravity, Urine: 1.017 (ref 1.005–1.030)
WBC, UA: NONE SEEN WBC/hpf (ref 0–5)
pH: 6 (ref 5.0–8.0)

## 2019-09-10 LAB — URINE DRUG SCREEN, QUALITATIVE (ARMC ONLY)
Amphetamines, Ur Screen: NOT DETECTED
Barbiturates, Ur Screen: NOT DETECTED
Benzodiazepine, Ur Scrn: POSITIVE — AB
Cannabinoid 50 Ng, Ur ~~LOC~~: NOT DETECTED
Cocaine Metabolite,Ur ~~LOC~~: NOT DETECTED
MDMA (Ecstasy)Ur Screen: NOT DETECTED
Methadone Scn, Ur: NOT DETECTED
Opiate, Ur Screen: NOT DETECTED
Phencyclidine (PCP) Ur S: NOT DETECTED
Tricyclic, Ur Screen: NOT DETECTED

## 2019-09-10 LAB — SARS CORONAVIRUS 2 BY RT PCR (HOSPITAL ORDER, PERFORMED IN ~~LOC~~ HOSPITAL LAB): SARS Coronavirus 2: NEGATIVE

## 2019-09-10 LAB — CK: Total CK: 36 U/L — ABNORMAL LOW (ref 49–397)

## 2019-09-10 MED ORDER — SERTRALINE HCL 50 MG PO TABS
100.0000 mg | ORAL_TABLET | Freq: Two times a day (BID) | ORAL | Status: DC
  Administered 2019-09-11 – 2019-09-15 (×9): 100 mg via ORAL
  Filled 2019-09-10 (×9): qty 2

## 2019-09-10 MED ORDER — LORAZEPAM 0.5 MG PO TABS
0.5000 mg | ORAL_TABLET | ORAL | Status: DC
  Administered 2019-09-11 – 2019-09-15 (×5): 0.5 mg via ORAL
  Filled 2019-09-10 (×5): qty 1

## 2019-09-10 MED ORDER — PROPRANOLOL HCL 20 MG PO TABS
20.0000 mg | ORAL_TABLET | Freq: Every day | ORAL | Status: DC
  Administered 2019-09-11 – 2019-09-15 (×5): 20 mg via ORAL
  Filled 2019-09-10 (×5): qty 1

## 2019-09-10 MED ORDER — OLANZAPINE 5 MG PO TABS
5.0000 mg | ORAL_TABLET | Freq: Every day | ORAL | Status: DC
  Administered 2019-09-11 – 2019-09-14 (×4): 5 mg via ORAL
  Filled 2019-09-10 (×5): qty 1

## 2019-09-10 MED ORDER — DIVALPROEX SODIUM 500 MG PO DR TAB
1000.0000 mg | DELAYED_RELEASE_TABLET | Freq: Two times a day (BID) | ORAL | Status: DC
  Administered 2019-09-10 – 2019-09-15 (×10): 1000 mg via ORAL
  Filled 2019-09-10 (×12): qty 2

## 2019-09-10 MED ORDER — BENZTROPINE MESYLATE 0.5 MG PO TABS
0.5000 mg | ORAL_TABLET | ORAL | Status: DC
  Administered 2019-09-11 – 2019-09-15 (×5): 0.5 mg via ORAL
  Filled 2019-09-10 (×6): qty 1

## 2019-09-10 MED ORDER — BENZTROPINE MESYLATE 1 MG PO TABS
1.0000 mg | ORAL_TABLET | Freq: Two times a day (BID) | ORAL | Status: DC
  Administered 2019-09-10 – 2019-09-14 (×8): 1 mg via ORAL
  Filled 2019-09-10 (×9): qty 1

## 2019-09-10 MED ORDER — ACETAMINOPHEN 500 MG PO TABS
1000.0000 mg | ORAL_TABLET | Freq: Once | ORAL | Status: AC
  Administered 2019-09-10: 20:00:00 1000 mg via ORAL

## 2019-09-10 MED ORDER — LORAZEPAM 1 MG PO TABS
1.0000 mg | ORAL_TABLET | Freq: Two times a day (BID) | ORAL | Status: DC
  Administered 2019-09-10 – 2019-09-14 (×8): 1 mg via ORAL
  Filled 2019-09-10 (×8): qty 1

## 2019-09-10 MED ORDER — OLANZAPINE 7.5 MG PO TABS
15.0000 mg | ORAL_TABLET | Freq: Every day | ORAL | Status: DC
  Administered 2019-09-11 – 2019-09-15 (×5): 15 mg via ORAL
  Filled 2019-09-10 (×4): qty 1
  Filled 2019-09-10: qty 2

## 2019-09-10 MED ORDER — ACETAMINOPHEN 500 MG PO TABS
ORAL_TABLET | ORAL | Status: AC
  Filled 2019-09-10: qty 2

## 2019-09-10 NOTE — ED Notes (Signed)
Received report from [place], [place]. Checked on patient, patient is resting comfortably at this time. Repositioned patient for sleep, turned lights off, put music on TV. Patient has no requests at this time, provided teach back on call light for assistance

## 2019-09-10 NOTE — ED Provider Notes (Signed)
[facility] Emergency Department Provider Note  ____________________________________________   First MD Initiated Contact with Patient 09/10/19 1441     (approximate)  I have reviewed the triage vital signs and the nursing notes.  History  Chief Complaint Fall and Leg Swelling    HPI Ozella AlmondDouglas M Carsten is a 58 y.o. male with a history of borderline intellectual functioning, schizophrenia, hypertension, recent right quadriceps tendon repair in May with resultant chronic R leg swelling, who presents emergency department for generalized weakness and frequent falls.  Patient recently returned home from a rehab facility (after operation and hospitalization for infection related to his tendon repair) on Tuesday.  Since returning home, the family states he has been very unsteady on his feet, and fallen several times.  They do not feel he was ready for return home, and feel like they do not have enough resources at home to adequately care for him.  Patient states yesterday, he felt so generally weak, that he fell in his room.  He fell forward and hit his head.  He denies any loss of consciousness.  He has an abrasion to his forehead, and resultant mild right knee pain.  He reports chronic swelling of the right lower extremity secondary to above.  He saw orthopedics in the clinic for this yesterday, and had ultrasound done that was negative for DVT.  With regards to his fall, he denies any preceding presyncopal symptoms including chest pain, palpitations, lightheadedness, or dizziness.  He denies any recent illnesses, no fevers, cough, difficulty breathing, chest pain, abdominal pain, nausea, vomiting, diarrhea, or urinary symptoms.  He lives at home with family (brother and sister in law), and ambulates with a walker.   Past Medical Hx Past Medical History:  Diagnosis Date  . Hypertension     Problem List Patient Active Problem List   Diagnosis Date Noted  . MSSA  bacteremia 07/09/2019  . Septic arthritis (HCC) 07/09/2019  . Cellulitis of right knee 06/01/2019  . Rupture of right quadriceps tendon 04/25/2019    Past Surgical Hx Past Surgical History:  Procedure Laterality Date  . INCISION AND DRAINAGE Right 06/02/2019   Procedure: INCISION AND DRAINAGE;  Surgeon: Christena FlakePoggi, John J, MD;  Location: [facility] ORS;  Service: Orthopedics;  Laterality: Right;  . QUADRICEPS TENDON REPAIR Right 04/25/2019   Procedure: REPAIR QUADRICEP TENDON;  Surgeon: Christena FlakePoggi, John J, MD;  Location: [facility] ORS;  Service: Orthopedics;  Laterality: Right;  . QUADRICEPS TENDON REPAIR Right 06/02/2019   Procedure: REPAIR QUADRICEP TENDON;  Surgeon: Christena FlakePoggi, John J, MD;  Location: [facility] ORS;  Service: Orthopedics;  Laterality: Right;    Medications Prior to Admission medications   Medication Sig Start Date End Date Taking? Authorizing Provider  acetaminophen (TYLENOL) 325 MG tablet Take 650 mg by mouth every 4 (four) hours as needed for mild pain. 03/21/17   [provider]  benztropine (COGENTIN) 0.5 MG tablet Take 0.5 mg by mouth every morning.     [provider]  benztropine (COGENTIN) 1 MG tablet Take 1 mg by mouth 2 (two) times daily.  03/25/19   [provider]  clonazePAM (KLONOPIN) 0.5 MG tablet Take 0.5 mg by mouth 3 (three) times daily as needed for agitation. 04/08/19   [provider]  divalproex (DEPAKOTE) 500 MG DR tablet Take 1,000 mg by mouth 2 (two) times a day. 02/13/17 07/23/19  [provider]  enoxaparin (LOVENOX) 40 MG/0.4ML injection Inject 0.4 mLs (40 mg total) into the skin daily for  14 days. 06/06/19 06/20/19  Reche Dixon, PA-C  INVEGA SUSTENNA 234 MG/1.5ML SUSY injection Inject 234 mg into the muscle every 30 (thirty) days. 04/10/19   [provider]  ketoconazole (NIZORAL) 2 % cream Apply 1 application topically. To groin and surrounding areas twice daily until resolved    [provider]  LORazepam (ATIVAN) 0.5 MG  tablet Take 0.5 mg by mouth every morning.     [provider]  LORazepam (ATIVAN) 0.5 MG tablet Take 0.5 mg by mouth daily as needed for anxiety.    [provider]  LORazepam (ATIVAN) 1 MG tablet Take 1 mg by mouth 2 (two) times daily.  01/23/19   [provider]  metroNIDAZOLE (METROGEL) 0.75 % gel Apply 1 application topically daily. 04/04/19   [provider]  OLANZapine (ZYPREXA) 15 MG tablet Take 15 mg by mouth daily. 03/25/19 07/23/19  [provider]  oxyCODONE (OXY IR/ROXICODONE) 5 MG immediate release tablet Take 1-2 tablets (5-10 mg total) by mouth every 4 (four) hours as needed for severe pain. 06/08/19   Mayo, Pete Pelt, MD  polyethylene glycol (MIRALAX / GLYCOLAX) 17 g packet Take 17 g by mouth daily as needed for mild constipation. 06/08/19   Mayo, Pete Pelt, MD  propranolol (INDERAL) 20 MG tablet Take 20 mg by mouth daily.  08/12/18   [provider]  sertraline (ZOLOFT) 100 MG tablet Take 100 mg by mouth 2 (two) times a day. 01/30/18 07/23/19  [provider]  traMADol (ULTRAM) 50 MG tablet Take 1 tablet (50 mg total) by mouth every 6 (six) hours as needed for moderate pain. 06/06/19   Reche Dixon, PA-C    Allergies Haloperidol lactate  Family Hx No family history on file.  Social Hx Social History   Tobacco Use  . Smoking status: Never Smoker  . Smokeless tobacco: Never Used  Substance Use Topics  . Alcohol use: Not on file  . Drug use: Not on file     Review of Systems  Constitutional: Negative for fever, chills. + generalized weakness Eyes: Negative for visual changes. ENT: Negative for sore throat. Cardiovascular: Negative for chest pain. Respiratory: Negative for shortness of breath. Gastrointestinal: Negative for nausea, vomiting.  Genitourinary: Negative for dysuria. Musculoskeletal: + for chornic leg swelling, R knee pain Skin: Negative for rash. Neurological: Negative for for headaches.   Physical  Exam  Vital Signs: ED Triage Vitals  Enc Vitals Group     BP 09/10/19 1447 101/84     Pulse Rate 09/10/19 1447 90     Resp 09/10/19 1447 19     Temp 09/10/19 1447 98.7 F (37.1 C)     Temp Source 09/10/19 1447 Oral     SpO2 09/10/19 1447 98 %     Weight 09/10/19 1448 (!) 350 lb (158.8 kg)     Height 09/10/19 1448 6' (1.829 m)     Head Circumference --      [facility] --      Pain Score 09/10/19 1448 8     Pain Loc --      Pain Edu? --      Excl. in [place]? --     Constitutional: Alert and oriented.  Head: Normocephalic. Abrasion to forehead.  Eyes: Conjunctivae clear. Sclera anicteric. Nose: No congestion. No rhinorrhea. Mouth/Throat: Mucous membranes are moist.  Neck: No stridor.  No midline C-spine tenderness. Cardiovascular: Normal rate, regular rhythm. Extremities well perfused. Chest: Chest wall stable and nontender.  No crepitance.  Respiratory: Normal respiratory effort.  No wheezing. Gastrointestinal: Soft. Non-tender. Non-distended.  Pelvis: Stable and nontender with AP and lateral compression. Musculoskeletal: Significant pitting edema of RLE, chronic per patient.  No obvious deformities.  LUE: Full range of motion, including shoulders, elbow, wrist, non-tender to palpation. RUE: Full range of motion, including shoulders, elbow, wrist, non-tender to palpation. LLE: Full range of motion, including hip, knee, ankle, nontender to palpation. RLE: Full range of motion at hip, ankle.  Range of motion at knee limited secondary to pain and swelling.  Mild tenderness of the right anterior knee. Neurologic:  Normal speech and language. No gross focal neurologic deficits are appreciated.  Skin: Abrasion to forehead.  No lacerations. Prior incision site at knee well appears, no erythema, warmth or drainage. Psychiatric: Mood and affect are appropriate for situation.  EKG  Personally reviewed.   Rate: 92 Rhythm: sinus Axis: normal Intervals: WNL No acute ischemic changes No  STEMI    Radiology  CT head/CS: IMPRESSION: Head CT: No acute intracranial abnormality.  Cervical CT: No acute fracture or malalignment of the cervical spine.  XR knee: IMPRESSION: No acute fracture or dislocation.  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   Procedures  Procedure(s) performed (including critical care):  Procedures   Initial Impression / Assessment and Plan / ED Course  58 y.o. male who presents to the ED for generalized weakness, falls, as above.  Ddx: electrolyte abnormality, UTI, injury related to the fall  Plan: labs, urine, EKG, imaging  Imaging is negative for acute traumatic injury.  Labs without actionable derangements.  No evidence of anemia, no electrolyte derangements, no UTI, no evidence of arrhythmia. Work up essentially negative.  Discussed results of work-up with family at bedside.  At this point in time, they do not feel like they can take him home, and are requesting evaluation by physical therapy and social work for placement.  Will obtain a COVID swab and pursue PT/SW evaluation.  Final Clinical Impression(s) / ED Diagnosis  Final diagnoses:  Generalized weakness  Fall in home, initial encounter       Note:  This document was prepared using Dragon voice recognition software and may include unintentional dictation errors.   Miguel Aschoff., MD 09/10/19 2152

## 2019-09-10 NOTE — ED Notes (Addendum)
Fall Posie alarm hooked up, bracelet on right arm and yellow socks placed on pt

## 2019-09-10 NOTE — ED Notes (Signed)
Patient transported to CT 

## 2019-09-10 NOTE — ED Notes (Signed)
Pt's sister at bedside.

## 2019-09-10 NOTE — ED Triage Notes (Signed)
He arrives today via [facility] from home  Pt reports experiencing a fall x2 in the last 24 hours  8/10 pain reported to his right leg  Peripheral edema present to right ankle  Pt reports edema present to this leg since "a DVT a few months ago"  2 reddened areas noted to forehead  Pt reports hitting his head with his fall last pm

## 2019-09-10 NOTE — ED Notes (Signed)
Called lab and spoke with Levada Dy they will add on CK to blood already in lab

## 2019-09-10 NOTE — ED Notes (Signed)
Patient transported to X-ray 

## 2019-09-10 NOTE — ED Notes (Signed)
Pt given meal tray and drink at this time 

## 2019-09-11 MED ORDER — ACETAMINOPHEN 325 MG PO TABS
ORAL_TABLET | ORAL | Status: AC
  Filled 2019-09-11: qty 1

## 2019-09-11 MED ORDER — ACETAMINOPHEN 325 MG PO TABS
650.0000 mg | ORAL_TABLET | Freq: Once | ORAL | Status: AC
  Administered 2019-09-11: 08:00:00 650 mg via ORAL
  Filled 2019-09-11: qty 2

## 2019-09-11 NOTE — ED Notes (Signed)
Cleansed pt of incontinent urine and small amount of incontinent bowel with assistance from [place] NT. Full linen change. Urinal and callbell at bedside

## 2019-09-11 NOTE — ED Notes (Signed)
Linens changed, pt clean and dry. PT at bedside to give physical exercise. Pt ate 75% of lunch.

## 2019-09-11 NOTE — ED Notes (Signed)
Pt assisted with emptying his urinal, put out approx 250 ml of urine, ate all his biscuit and said it was good, no distress noted, assisted with using a portable phone to call his mom

## 2019-09-11 NOTE — ED Notes (Signed)
Pt resting quieltty, no distress noted, waiting for breakfast tray, dining services called

## 2019-09-11 NOTE — Progress Notes (Addendum)
2nd shift ED CSW received a handoff from the 1st shift [facility] ED CSW.    According to Tammy at [facility] pt only has "possibly" 3 days of his 100 authorized SNF days left for the year and thus was declined by [facility].    This has not been necessarily confirmed through any other source.  CSW will continue to follow for D/C needs.  Alphonse Guild. Omir Cooprider, LCSW, LCAS, CSI Transitions of Care Clinical Social Worker Care Coordination Department Ph: [phone]

## 2019-09-11 NOTE — ED Notes (Signed)
Pt ringing call bell asking for something for pain. Pt reports pain is at a 10/10 in his right knee and states that he is having difficulty bending it, pt asking if he will go home today and asking for breakfast. Pt is pleasant and thankful for his care.

## 2019-09-11 NOTE — Progress Notes (Signed)
Per message from [facility] in the hub, patient was declined at [facility] for only having 3 days left on 100 day benefit for the year. May not qualify for SNF at all with [facility] benefits at another SNF.

## 2019-09-11 NOTE — ED Notes (Signed)
Patient placed on 2L due to desat while sleeping. Urinal at bedside; fall socks and bracelet in place

## 2019-09-11 NOTE — ED Notes (Signed)
Patient given breakfast tray. Patient also requesting to go back to [facility]. MD Jimmye Norman made aware

## 2019-09-11 NOTE — NC FL2 (Signed)
[place] MEDICAID FL2 LEVEL OF CARE SCREENING TOOL     IDENTIFICATION  Patient Name: Ethan Alvarez Birthdate: 1961/09/30 Sex: male Admission Date (Current Location): 09/10/2019  [place] and [place] Number:  Chiropodist and Address:  [facility], [address], [place], [place] [address]      Provider Number: [id number]  Attending Physician Name and Address:  No att. providers found  Relative Name and Phone Number:       Current Level of Care: Hospital Recommended Level of Care: Skilled Nursing Facility Prior Approval Number:    Date Approved/Denied:   PASRR Number: [id number]  Discharge Plan: SNF    Current Diagnoses: Patient Active Problem List   Diagnosis Date Noted  . MSSA bacteremia 07/09/2019  . Septic arthritis (HCC) 07/09/2019  . Cellulitis of right knee 06/01/2019  . Rupture of right quadriceps tendon 04/25/2019    Orientation RESPIRATION BLADDER Height & Weight     Self, Time, Situation, Place  Normal Continent Weight: (!) 350 lb (158.8 kg) Height:  6' (182.9 cm)  BEHAVIORAL SYMPTOMS/MOOD NEUROLOGICAL BOWEL NUTRITION STATUS      Continent Diet(regular)  AMBULATORY STATUS COMMUNICATION OF NEEDS Skin   Limited Assist Verbally Normal                       Personal Care Assistance Level of Assistance  Bathing, Feeding, Dressing Bathing Assistance: Limited assistance Feeding assistance: Independent Dressing Assistance: Limited assistance     Functional Limitations Info  Sight, Hearing, Speech Sight Info: Adequate Hearing Info: Adequate Speech Info: Adequate    SPECIAL CARE FACTORS FREQUENCY  PT (By licensed PT), OT (By licensed OT)     PT Frequency: 5x weekly OT Frequency: 5x weekly            Contractures Contractures Info: Not present    Additional Factors Info  Code Status, Allergies, Psychotropic Code Status Info: Full Allergies Info: Haloperidol Lactate Psychotropic Info: see med list         Current Medications (09/11/2019):  This is the current hospital active medication list Current Facility-Administered Medications  Medication Dose Route Frequency Provider Last Rate Last Dose  . acetaminophen (TYLENOL) 325 MG tablet           . benztropine (COGENTIN) tablet 0.5 mg  0.5 mg Oral Carolann Littler., MD   0.5 mg at 09/11/19 0800  . benztropine (COGENTIN) tablet 1 mg  1 mg Oral BID Miguel Aschoff., MD   1 mg at 09/11/19 1308  . divalproex (DEPAKOTE) DR tablet 1,000 mg  1,000 mg Oral Q12H Miguel Aschoff., MD   1,000 mg at 09/11/19 0802  . LORazepam (ATIVAN) tablet 0.5 mg  0.5 mg Oral Carolann Littler., MD   0.5 mg at 09/11/19 0803  . LORazepam (ATIVAN) tablet 1 mg  1 mg Oral BID Miguel Aschoff., MD   1 mg at 09/11/19 1308  . OLANZapine (ZYPREXA) tablet 15 mg  15 mg Oral Daily Miguel Aschoff., MD   15 mg at 09/11/19 0801  . OLANZapine (ZYPREXA) tablet 5 mg  5 mg Oral Daily Miguel Aschoff., MD   5 mg at 09/11/19 1308  . propranolol (INDERAL) tablet 20 mg  20 mg Oral Daily Miguel Aschoff., MD   20 mg at 09/11/19 0802  . sertraline (ZOLOFT) tablet 100 mg  100 mg Oral BID Miguel Aschoff., MD   100 mg at 09/11/19 [phone]  Current Outpatient Medications  Medication Sig Dispense Refill  . ascorbic acid (VITAMIN C) 500 MG tablet Take 500 mg by mouth daily.    . benztropine (COGENTIN) 0.5 MG tablet Take 0.5 mg by mouth every morning.     . benztropine (COGENTIN) 1 MG tablet Take 1 mg by mouth 2 (two) times daily.     . cholecalciferol (VITAMIN D3) 25 MCG (1000 UT) tablet Take 1,000 Units by mouth daily.    . divalproex (DEPAKOTE) 500 MG DR tablet Take 1,000 mg by mouth 2 (two) times a day.    Lorayne Bender SUSTENNA 234 MG/1.5ML SUSY injection Inject 234 mg into the muscle every 30 (thirty) days.    Marland Kitchen LORazepam (ATIVAN) 0.5 MG tablet Take 0.5 mg by mouth every morning.     Marland Kitchen LORazepam (ATIVAN) 1 MG tablet Take 1 mg by mouth 2 (two) times daily.     Marland Kitchen OLANZapine (ZYPREXA) 15 MG  tablet Take 15 mg by mouth daily.    Marland Kitchen OLANZapine (ZYPREXA) 5 MG tablet Take 5 mg by mouth daily.    . propranolol (INDERAL) 20 MG tablet Take 20 mg by mouth daily.     . sertraline (ZOLOFT) 100 MG tablet Take 100 mg by mouth 2 (two) times a day.    . oxyCODONE (OXY IR/ROXICODONE) 5 MG immediate release tablet Take 1-2 tablets (5-10 mg total) by mouth every 4 (four) hours as needed for severe pain. (Patient not taking: Reported on 09/10/2019) 30 tablet 0  . polyethylene glycol (MIRALAX / GLYCOLAX) 17 g packet Take 17 g by mouth daily as needed for mild constipation. (Patient not taking: Reported on 09/10/2019) 14 each 0  . traMADol (ULTRAM) 50 MG tablet Take 1 tablet (50 mg total) by mouth every 6 (six) hours as needed for moderate pain. (Patient not taking: Reported on 09/10/2019) 30 tablet 1     Discharge Medications: Please see discharge summary for a list of discharge medications.  Relevant Imaging Results:  Relevant Lab Results:   Additional Information SSN: [id number]  Janace Hoard, LCSW

## 2019-09-11 NOTE — ED Notes (Signed)
Breakfast tray given to pt 

## 2019-09-11 NOTE — TOC Initial Note (Addendum)
Transition of Care [facility]) - Initial/Assessment Note    Patient Details  Name: Ethan Alvarez MRN: [id number] Date of Birth: [birth year]  [facility] CM/SW Contact:    Janace Hoard, LCSW Phone Number: 09/11/2019, 2:33 PM  Clinical Narrative:                 Patient is a 58 yr old, Caucasian, male who presented from the ED for generalized weakness. Patient is to be seen by PT for recommedrupture of the patellar tendon, possible septic arthritis, and patellar osteomyelitisnations. Of note, patient sent to [facility] back in July for rupture of the patellar tendon, possible septic arthritis, and patellar osteomyelitis. [facility] was discharged from [facility] on Tuesday and since then patient has fallen several times.   CSW spoke with patient briefly after being unable to reach his brother/legal guardian [name]. Patient reports he would like to go back to [facility]. Patient talked about the falls he experienced at home. CSW then contacted patient's sister-in-law, [name]. [name] reports she is the primary contact person as her husband works and is unable to answer the phone. [name] reports she and her husband do want patient to go back to SNF and feels he needs more rehab. She reports patient was discharged for insurance reasons. Patient was sent home with [facility] through [facility] and they came out to see patient. [name] reports she is okay with patient going back to [facility] since patient expressed he liked it there. Patient has also been to [facility] [place]. [name] was agreeable to have patient's information faxed to other facilities as well.  CSW contacted [facility] to see if they will take patient back once he has PT recommendations. CSW left a message with admissions person, Tammy for a call back. CSW also confirmed that patient is active with [facility] and made them aware the recommendation at this time is SNF. CSW spoke with Blanchard Mane.  CSW will continue to follow up.     Expected Discharge Plan: Skilled Nursing Facility Barriers to Discharge: No Barriers Identified   Patient Goals and CMS Choice Patient states their goals for this hospitalization and ongoing recovery are:: Per sister-in-law "he needs more rehab." CMS Medicare.gov Compare Post Acute Care list provided to:: Legal Guardian Choice offered to / list presented to : [place] / Guardian  Expected Discharge Plan and Services Expected Discharge Plan: [place] Choice: [place] arrangements for the past 2 months: Single Family Home                                      Prior Living Arrangements/Services Living arrangements for the past 2 months: Single Family Home Lives with:: Relatives([name] (brother/LG) and sister in law) Patient language and need for interpreter reviewed:: Yes Do you feel safe going back to the place where you live?: Yes      Need for Family Participation in Patient Care: Yes (Comment) Care giver support system in place?: Yes (comment) Current home services: Home OT, Home PT, Home RN Criminal Activity/Legal Involvement Pertinent to Current Situation/Hospitalization: Yes - Comment as needed  Activities of Daily Living      Permission Sought/Granted Permission sought to share information with : Facility Art therapist granted to share information with : Yes, Verbal Permission Granted  Share Information with NAME: Juancarlos Crescenzo and [employer]  Permission granted to share info w AGENCY: SNFs  Permission granted to share info w Relationship: brother/legal guardian and sister-in-law  Permission granted to share info w Contact Information: [phone] and [phone]  Emotional Assessment Appearance:: (unable to assess, spoke with patient and family on phone) Attitude/Demeanor/Rapport: Engaged Affect (typically observed): Accepting Orientation: : Oriented to Self, Oriented to Place,  Oriented to  Time, Oriented to Situation      Admission diagnosis:  r knee pain Patient Active Problem List   Diagnosis Date Noted  . MSSA bacteremia 07/09/2019  . Septic arthritis (HCC) 07/09/2019  . Cellulitis of right knee 06/01/2019  . Rupture of right quadriceps tendon 04/25/2019   PCP:  Mickey Farber, MD Pharmacy:   [facility] - [place], [place] - [address] [place] [address] Phone: [phone] Fax: [phone]     Social Determinants of Health (SDOH) Interventions    Readmission Risk Interventions Readmission Risk Prevention Plan 04/26/2019  Medication Screening Complete  Transportation Screening Complete  Some recent data might be hidden

## 2019-09-11 NOTE — ED Notes (Signed)
Patient provided with crackers at this time

## 2019-09-11 NOTE — ED Notes (Signed)
Provided hand hygiene, given lunch. Pt clean and dry. Denies further needs. Eating lunch now and awaiting PT consult. Pt knee brace here with patient now and PT informed.

## 2019-09-11 NOTE — ED Notes (Signed)
Sister in law going to get knee brace from patient home and bring to ER. On her way now.

## 2019-09-11 NOTE — Evaluation (Signed)
Physical Therapy Evaluation Patient Details Name: Ethan AlmondDouglas M Wigal MRN: [id number] DOB: 05/10/1961 Today's Date: 09/11/2019   History of Present Illness  presented to ER secondary to fall (hitting head) and generalized R LE edema.  Recent PMH significant for R quad tendon rupture and repair 04/25/2019 with subsequent irrigation/debridement and repair of recurrent R quad tendon repair (06/02/19).  Clinical Impression  Upon evaluation, patient alert and oriented to basic information; follows commands and demonstrates good effort with all mobility tasks.  R LE immobilizer donned for participation with session, adjusted to appropriate position and fit.  Significant generalized edema noted throughout R LE, limited R ankle DF noted (may benefit from bracing if persists).  Currently requiring min assist for bed mobility; cga/min assist +2 for sit/stand, basic transfers and gait (20') with RW.  Demonstrates  very short, shuffling steps; excessive L LE weight shift/trunk lean to assist with R LE clearance/advancement (KI locked in ext).  R foot drop evident.  Very slow and effortful, limited balance reactions. Unsafe to attempt without RW and +1 at all times. Would benefit from skilled PT to address above deficits and promote optimal return to PLOF; recommend brief transition to STR to readdress balance, mobility (and brace donning/doffing/use) after recent falls.  May consider transition to LTC for longer-term care needs.     Follow Up Recommendations SNF(may consider transition to LTC for increased support long-term)    Equipment Recommendations       Recommendations for Other Services       Precautions / Restrictions Precautions Precautions: Fall Restrictions Weight Bearing Restrictions: Yes RLE Weight Bearing: Weight bearing as tolerated Other Position/Activity Restrictions: Per Horris LatinoLance McGhee, "continue knee immobilzation while ambulatory and he can unlock it and work on as much flexion as tolerated  when non-weightbearing to that leg"  (09/11/19 via secure chat)      Mobility  Bed Mobility Overal bed mobility: Needs Assistance Bed Mobility: Supine to Sit;Sit to Supine     Supine to sit: Min assist Sit to supine: Min guard   General bed mobility comments: heavy use of momentum and bedrails to complete  Transfers Overall transfer level: Needs assistance Equipment used: Rolling walker (2 wheeled) Transfers: Sit to/from Stand Sit to Stand: Min guard;Min assist;+2 physical assistance         General transfer comment: cuing for hand placement; slightly impulsive, decreased eccentric control with stand to sit  Ambulation/Gait Ambulation/Gait assistance: Min assist;Min guard Gait Distance (Feet): 25 Feet Assistive device: Rolling walker (2 wheeled)       General Gait Details: very short, shuffling steps; excessive L LE weight shift/trunk lean to assist with R LE clearance/advancement (KI locked in ext).  R foot drop evident.  Very slow and effortful, limited balance reactions. Unsafe to attempt without RW and +1 at all times.  Stairs            Wheelchair Mobility    Modified Rankin (Stroke Patients Only)       Balance Overall balance assessment: Needs assistance Sitting-balance support: No upper extremity supported;Feet supported Sitting balance-Leahy Scale: Good     Standing balance support: Bilateral upper extremity supported Standing balance-Leahy Scale: Fair                               Pertinent Vitals/Pain Pain Assessment: Faces Faces Pain Scale: Hurts a little bit Pain Location: R LE Pain Descriptors / Indicators: Aching;Guarding Pain Intervention(s): Limited activity within patient's tolerance;Monitored  during session;Repositioned    Home Living Family/patient expects to be discharged to:: Skilled nursing facility                 Additional Comments: At baseline, patient lives in basement level apartment (no stairs  required for entry/exit) of mother's home per his report.    Prior Function           Comments: Per patient, ambulatory with RW for ADLs and baseline mobility; caregiver (present to assist both mother and patient) available to assist with household chores/responsibilities as needed.  Does endorse at least 1-2 falls in previous week (completed course of rehab and was home one week)     Hand Dominance        Extremity/Trunk Assessment   Upper Extremity Assessment Upper Extremity Assessment: Overall WFL for tasks assessed    Lower Extremity Assessment Lower Extremity Assessment: (R LE immobilized in bledsoe brace (adjusted for position and fit), R ankle DF 0/5; L LE grossly 4/5 throughout)       Communication      Cognition Arousal/Alertness: Awake/alert Behavior During Therapy: WFL for tasks assessed/performed Overall Cognitive Status: No family/caregiver present to determine baseline cognitive functioning                                 General Comments: oriented to self, location and general situation; follows commands and participates well.  Baseline developmental delay evident.      General Comments      Exercises Other Exercises Other Exercises: Donned R LE bledsoe brace for protection of R knee; dep assist to don/adjust/manage.  Patient does endorse not wearing brace reliably at home.   Assessment/Plan    PT Assessment Patient needs continued PT services  PT Problem List Decreased strength;Decreased range of motion;Decreased cognition;Decreased balance;Decreased activity tolerance;Decreased mobility;Decreased coordination;Decreased knowledge of use of DME;Decreased knowledge of precautions;Decreased safety awareness       PT Treatment Interventions DME instruction;Gait training;Functional mobility training;Therapeutic activities;Therapeutic exercise;Balance training;Neuromuscular re-education;Cognitive remediation;Patient/family education    PT  Goals (Current goals can be found in the Care Plan section)  Acute Rehab PT Goals Patient Stated Goal: [facility] Goal Formulation: With patient Time For Goal Achievement: 09/25/19 Potential to Achieve Goals: Good    Frequency Min 2X/week   Barriers to discharge        Co-evaluation               AM-PAC PT "6 Clicks" Mobility  Outcome Measure Help needed turning from your back to your side while in a flat bed without using bedrails?: A Little Help needed moving from lying on your back to sitting on the side of a flat bed without using bedrails?: A Little Help needed moving to and from a bed to a chair (including a wheelchair)?: A Little Help needed standing up from a chair using your arms (e.g., wheelchair or bedside chair)?: A Little Help needed to walk in hospital room?: A Little Help needed climbing 3-5 steps with a railing? : A Lot 6 Click Score: 17    End of Session Equipment Utilized During Treatment: Gait belt Activity Tolerance: Patient tolerated treatment well Patient left: in bed;with bed alarm set;with call bell/phone within reach Nurse Communication: Mobility status PT Visit Diagnosis: Difficulty in walking, not elsewhere classified (R26.2);Muscle weakness (generalized) (M62.81);Repeated falls (R29.6);Unsteadiness on feet (R26.81)    Time: 1430-1455 PT Time Calculation (min) (ACUTE ONLY):  25 min   Charges:   PT Evaluation $PT Eval Moderate Complexity: 1 Mod PT Treatments $Therapeutic Activity: 8-22 mins       Reagen Goates H. Manson Passey, PT, DPT, NCS 09/11/19, 3:22 PM [phone]

## 2019-09-11 NOTE — ED Notes (Signed)
Verified correct medications entered in pt med rec and MAR with both pharmacy tech and EDP.

## 2019-09-11 NOTE — ED Notes (Signed)
PT at bedside.

## 2019-09-11 NOTE — ED Notes (Signed)
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 Pt asking again about being dishcharged today and reminded that we are waiting for other consultations to see about his knee and leg strength no distress noted, cont to monitor

## 2019-09-12 MED ORDER — LORAZEPAM 2 MG PO TABS
2.0000 mg | ORAL_TABLET | Freq: Once | ORAL | Status: AC
  Administered 2019-09-12: 20:00:00 2 mg via ORAL
  Filled 2019-09-12: qty 1

## 2019-09-12 NOTE — Social Work (Signed)
  [place] worker called family (brother and sister) to inform them that the patient received a bed offer at [facility].  Family (guardian) accepted bed offer.   [place] called [employer] [facility] [employer] left message on [employer].   Plan: Discharge to [facility]. Pending insurance [facility]) authorization.   Berenice Bouton, MSW, LCSW  [phone] 8am-6pm (weekends) or CSW ED # [phone]

## 2019-09-12 NOTE — ED Provider Notes (Signed)
-----------------------------------------   6:05 AM on 09/12/2019 -----------------------------------------   Blood pressure 119/68, pulse 78, temperature 97.7 F (36.5 C), temperature source Oral, resp. rate 14, height 1.829 m (6'), weight (!) 158.8 kg, SpO2 94 %.  The patient is currently sleeping.  He has been awake and intermittently somewhat agitated during the night.  No acute medical complaints.  Awaiting social work assistance for placement.   Hinda Kehr, MD 09/12/19 [phone]

## 2019-09-12 NOTE — ED Notes (Signed)
Patient placed on 2L oxygen

## 2019-09-12 NOTE — ED Notes (Signed)
Pt cleaned of large amount of stool.  All linens changed.  Pt reminded to call staff for use of bedpan, verbalizes understanding.

## 2019-09-12 NOTE — Social Work (Signed)
TOC CM/SW - Handoff received.  Patient to discharge to SNF pending bed offers.  According to [facility] CM/SW notes in chart the patient has exhausted his 97 days of 100 days qualifying stay at a preferred nursing facility for this calendar year. The patient has [facility] which might only pay for 3 days.    Berenice Bouton, MSW, LCSW  [phone] 8am-6pm (weekends) or CSW ED # [phone]

## 2019-09-12 NOTE — ED Notes (Signed)
Pt incontinent of urine, pt cleaned and dry linen placed.  Pt voices no other c/o at this time.

## 2019-09-12 NOTE — ED Notes (Signed)
Report received, care of pt assumed.  Pt resting at this time, NAD noted.  Pt requesting update, will check in with Social work and provide update for pt.  Will monitor.

## 2019-09-12 NOTE — ED Notes (Signed)
Pt provided lunch tray.

## 2019-09-12 NOTE — Social Work (Addendum)
TOC CM/SW   1024 call received from Concha Pyo Peplinski - Updated [name] on status of SNF bed offer. Pending bed offers. Discussed with sister possibly going home with existing [place] services.    [place] worker call family to inform them of pending SNF bed offers.   Berenice Bouton, MSW, LCSW  [phone] 8am-6pm (weekends) or CSW ED # [phone]

## 2019-09-12 NOTE — ED Notes (Signed)
Pt extremely anxious, crying and worried he was going to be sent to [facility]. EDP aware. Reassurance given.

## 2019-09-12 NOTE — ED Notes (Signed)
Meal tray provided at this time.

## 2019-09-12 NOTE — ED Notes (Signed)
PT sleeping quietly at this time.  Resp even and nonlabored, medications held at this time, will administer when pt awakes.

## 2019-09-13 MED ORDER — LORAZEPAM 1 MG PO TABS
1.0000 mg | ORAL_TABLET | Freq: Once | ORAL | Status: AC
  Administered 2019-09-13: 1 mg via ORAL
  Filled 2019-09-13: qty 1

## 2019-09-13 MED ORDER — ACETAMINOPHEN 325 MG PO TABS
650.0000 mg | ORAL_TABLET | Freq: Once | ORAL | Status: AC
  Administered 2019-09-13: 01:00:00 650 mg via ORAL
  Filled 2019-09-13: qty 2

## 2019-09-13 NOTE — ED Notes (Signed)
Pt brief wet from urine, brief changed. Socks changed and pt position moved in bed. Water cup provided at bedside.

## 2019-09-13 NOTE — ED Notes (Signed)
Pt moved into hospital bed. Breakfast tray eaten.

## 2019-09-13 NOTE — ED Notes (Signed)
Pt R ankle/foot swollen. Skin color appropriate for ethnicity. Cap refill <3 sec

## 2019-09-13 NOTE — Social Work (Signed)
TOC CM/SW received call from the patient's sister, [name].  Updated on sister on SNF progress - SW waiting for facility administrators to call back from [facility].   Berenice Bouton, MSW, LCSW  [phone] 8am-6pm (weekends) or CSW ED # [phone]

## 2019-09-13 NOTE — ED Notes (Signed)
Patient repositioned, brief changed, Allevyn foam pad placed. Patient did not want to be turned at this time. Heels elevated off bed, nighttime meds given. Vitals taken

## 2019-09-13 NOTE — ED Provider Notes (Signed)
-----------------------------------------   4:27 AM on 09/13/2019 -----------------------------------------  Blood pressure 140/72, pulse 80, temperature (!) 97.5 F (36.4 C), temperature source Oral, resp. rate 18, height 6' (1.829 m), weight (!) 158.8 kg, SpO2 100 %.  The patient is calm and cooperative at this time.  There have been no acute events since the last update.  Awaiting further assistance with disposition from social work.   Blake Divine, MD 09/13/19 [phone]

## 2019-09-13 NOTE — ED Notes (Signed)
Patient repositioned in bed, brief dry at this time, offered toileting; patient declined. Vitals obtained, patient turned to R side to prevent skin breakdown. Patient told that we would place Allevyn foam to support bottom. Pillow applied under heels. Patient told he could have one ice cream as a snack before bed. Classical music playing for patient comfort

## 2019-09-13 NOTE — ED Notes (Signed)
Pt remains unlabored, still asleep. NAD.  Waiting on placement.

## 2019-09-13 NOTE — ED Notes (Signed)
Pt given meal tray.

## 2019-09-13 NOTE — ED Notes (Signed)
Pt refused oral care.

## 2019-09-13 NOTE — ED Notes (Signed)
Patient resting comfortably

## 2019-09-13 NOTE — ED Notes (Signed)
Pt given breakfast tray. States he doesn't want it.

## 2019-09-13 NOTE — ED Notes (Signed)
Pt had very loose BM. Pt cleaned up and new brief placed.

## 2019-09-13 NOTE — ED Notes (Signed)
Pt cleaned up and new brief placed on pt. Pt scooted up in bed. Gave ice cream.

## 2019-09-13 NOTE — ED Notes (Signed)
Pt using phone  

## 2019-09-14 DIAGNOSIS — I10 Essential (primary) hypertension: Secondary | ICD-10-CM | POA: Diagnosis present

## 2019-09-14 DIAGNOSIS — W19XXXA Unspecified fall, initial encounter: Secondary | ICD-10-CM | POA: Diagnosis present

## 2019-09-14 DIAGNOSIS — Z86718 Personal history of other venous thrombosis and embolism: Secondary | ICD-10-CM | POA: Diagnosis not present

## 2019-09-14 DIAGNOSIS — S0081XA Abrasion of other part of head, initial encounter: Secondary | ICD-10-CM | POA: Diagnosis present

## 2019-09-14 DIAGNOSIS — Z79899 Other long term (current) drug therapy: Secondary | ICD-10-CM | POA: Diagnosis not present

## 2019-09-14 DIAGNOSIS — F79 Unspecified intellectual disabilities: Secondary | ICD-10-CM | POA: Diagnosis present

## 2019-09-14 DIAGNOSIS — R531 Weakness: Secondary | ICD-10-CM | POA: Diagnosis present

## 2019-09-14 DIAGNOSIS — R296 Repeated falls: Secondary | ICD-10-CM | POA: Diagnosis present

## 2019-09-14 DIAGNOSIS — M25561 Pain in right knee: Secondary | ICD-10-CM | POA: Diagnosis present

## 2019-09-14 DIAGNOSIS — Y92009 Unspecified place in unspecified non-institutional (private) residence as the place of occurrence of the external cause: Secondary | ICD-10-CM | POA: Diagnosis not present

## 2019-09-14 DIAGNOSIS — R45851 Suicidal ideations: Secondary | ICD-10-CM | POA: Diagnosis present

## 2019-09-14 DIAGNOSIS — Z20828 Contact with and (suspected) exposure to other viral communicable diseases: Secondary | ICD-10-CM | POA: Diagnosis present

## 2019-09-14 DIAGNOSIS — R2681 Unsteadiness on feet: Secondary | ICD-10-CM | POA: Diagnosis present

## 2019-09-14 DIAGNOSIS — F209 Schizophrenia, unspecified: Secondary | ICD-10-CM | POA: Diagnosis present

## 2019-09-14 LAB — CBC
HCT: 35.5 % — ABNORMAL LOW (ref 39.0–52.0)
Hemoglobin: 11.9 g/dL — ABNORMAL LOW (ref 13.0–17.0)
MCH: 32.4 pg (ref 26.0–34.0)
MCHC: 33.5 g/dL (ref 30.0–36.0)
MCV: 96.7 fL (ref 80.0–100.0)
Platelets: 121 10*3/uL — ABNORMAL LOW (ref 150–400)
RBC: 3.67 MIL/uL — ABNORMAL LOW (ref 4.22–5.81)
RDW: 13.1 % (ref 11.5–15.5)
WBC: 4.3 10*3/uL (ref 4.0–10.5)
nRBC: 0 % (ref 0.0–0.2)

## 2019-09-14 LAB — BASIC METABOLIC PANEL
Anion gap: 9 (ref 5–15)
BUN: 10 mg/dL (ref 6–20)
CO2: 27 mmol/L (ref 22–32)
Calcium: 7.8 mg/dL — ABNORMAL LOW (ref 8.9–10.3)
Chloride: 99 mmol/L (ref 98–111)
Creatinine, Ser: 0.54 mg/dL — ABNORMAL LOW (ref 0.61–1.24)
GFR calc Af Amer: >60 mL/min (ref >60)
GFR calc non Af Amer: >60 mL/min (ref >60)
Glucose, Bld: 94 mg/dL (ref 70–99)
Potassium: 4.1 mmol/L (ref 3.5–5.1)
Sodium: 135 mmol/L (ref 135–145)

## 2019-09-14 LAB — SARS CORONAVIRUS 2 BY RT PCR (HOSPITAL ORDER, PERFORMED IN ~~LOC~~ HOSPITAL LAB): SARS Coronavirus 2: NEGATIVE

## 2019-09-14 MED ORDER — CHLORHEXIDINE GLUCONATE CLOTH 2 % EX PADS: 6.0000 | MEDICATED_PAD | Freq: Every day | CUTANEOUS | Status: DC

## 2019-09-14 MED ORDER — VITAMIN C 500 MG PO TABS
500.0000 mg | ORAL_TABLET | Freq: Every day | ORAL | Status: DC
  Administered 2019-09-15: 10:00:00 500 mg via ORAL
  Filled 2019-09-14: qty 1

## 2019-09-14 MED ORDER — ENOXAPARIN SODIUM 40 MG/0.4ML ~~LOC~~ SOLN: 40.0000 mg | SUBCUTANEOUS | Status: DC

## 2019-09-14 MED ORDER — VITAMIN D 25 MCG (1000 UNIT) PO TABS
1000.0000 [IU] | ORAL_TABLET | Freq: Every day | ORAL | Status: DC
  Administered 2019-09-14 – 2019-09-15 (×2): 1000 [IU] via ORAL
  Filled 2019-09-14 (×2): qty 1

## 2019-09-14 MED ORDER — ENOXAPARIN SODIUM 40 MG/0.4ML ~~LOC~~ SOLN
40.0000 mg | Freq: Two times a day (BID) | SUBCUTANEOUS | Status: DC
  Administered 2019-09-14 – 2019-09-15 (×2): 40 mg via SUBCUTANEOUS
  Filled 2019-09-14 (×2): qty 0.4

## 2019-09-14 NOTE — Progress Notes (Signed)
Physical Therapy Treatment Patient Details Name: Ethan Alvarez MRN: [id number] DOB: [birth year] Today's Date: 09/14/2019    History of Present Illness presented to ER secondary to fall (hitting head) and generalized R LE edema.  Recent PMH significant for R quad tendon rupture and repair 04/25/2019 with subsequent irrigation/debridement and repair of recurrent R quad tendon repair (06/02/19).    PT Comments    Pt agreeable to PT; reports 3/10 pain initially RLE. Pt with Mod I for supine to sit. Min A to stand from elevated surface; effortful. Upon stand pain increases in RLE rather quickly to 10. Pt participates in some exercise with HR increasing to low to mid 130's and O2 sat decreasing as well. Improved with seated rest and repeat response with second attempt to stand/walk. Pt unable to walk this session. Return to bed with Min A and educated on supine exercises. Pt declined work on R knee flexion due to elevated pain. Continue PT with request to treat daily; frequency changed to reflect. Continue to progress strength, R knee range and endurance to improve functional mobility.    Follow Up Recommendations  SNF     Equipment Recommendations       Recommendations for Other Services       Precautions / Restrictions Precautions Precautions: Fall Restrictions Weight Bearing Restrictions: Yes RLE Weight Bearing: Weight bearing as tolerated    Mobility  Bed Mobility Overal bed mobility: Needs Assistance Bed Mobility: Supine to Sit;Sit to Supine     Supine to sit: Modified independent (Device/Increase time) Sit to supine: Min assist   General bed mobility comments: Increased time/effort to sit with heavy use of rail. Min A for LE return to bed with increased Mod A to reposition upward in flat bed  Transfers Overall transfer level: Needs assistance Equipment used: Rolling walker (2 wheeled) Transfers: Sit to/from Stand Sit to Stand: Min assist;From elevated surface          General transfer comment: Effortful stand with need for assist to steady in stand. Increased pain with continued stand. Performed 2 times  Ambulation/Gait             General Gait Details: Unable to this date due to increased RLE pain and heavy lean on rw with inability to DF R foot/ankle for clearnace. Increased effort to maneuver RLE forward increasing pain and decreasing stamina. 2 attempts   Stairs             Wheelchair Mobility    Modified Rankin (Stroke Patients Only)       Balance Overall balance assessment: Needs assistance Sitting-balance support: Bilateral upper extremity supported;Feet supported Sitting balance-Leahy Scale: Good     Standing balance support: Bilateral upper extremity supported Standing balance-Leahy Scale: Fair                              Cognition Arousal/Alertness: Awake/alert Behavior During Therapy: WFL for tasks assessed/performed Overall Cognitive Status: No family/caregiver present to determine baseline cognitive functioning                                        Exercises General Exercises - Lower Extremity Ankle Circles/Pumps: AAROM;AROM;Right;20 reps(needs AAROM to start; poor range without assist) Quad Sets: Strengthening;Both;20 reps Gluteal Sets: Strengthening;Both;20 reps Hip ABduction/ADduction: Strengthening;Right;10 reps;Standing(AAROM R 20x supine) Straight Leg Raises: Strengthening;Right;10 reps;Standing(AAROM 2 x 10 supine)  General Comments        Pertinent Vitals/Pain Pain Assessment: 0-10 Pain Score: 3 (initially; increases to 10 with stand) Pain Location: R knee, foot, ankle Pain Descriptors / Indicators: Aching    Home Living                      Prior Function            PT Goals (current goals can now be found in the care plan section) Progress towards PT goals: Progressing toward goals(slowly)    Frequency    7X/week      PT Plan Current  plan remains appropriate    Co-evaluation              AM-PAC PT "6 Clicks" Mobility   Outcome Measure  Help needed turning from your back to your side while in a flat bed without using bedrails?: A Little Help needed moving from lying on your back to sitting on the side of a flat bed without using bedrails?: A Little Help needed moving to and from a bed to a chair (including a wheelchair)?: A Lot Help needed standing up from a chair using your arms (e.g., wheelchair or bedside chair)?: A Little Help needed to walk in hospital room?: A Lot Help needed climbing 3-5 steps with a railing? : Total 6 Click Score: 14    End of Session Equipment Utilized During Treatment: Gait belt Activity Tolerance: Patient limited by pain Patient left: in bed;with call bell/phone within reach;with bed alarm set   PT Visit Diagnosis: Difficulty in walking, not elsewhere classified (R26.2);Muscle weakness (generalized) (M62.81);Repeated falls (R29.6);Unsteadiness on feet (R26.81)     Time: 3086-5784 PT Time Calculation (min) (ACUTE ONLY): 32 min  Charges:  $Therapeutic Exercise: 8-22 mins $Therapeutic Activity: 8-22 mins                       Scot Dock, PTA 09/14/2019, 11:48 AM

## 2019-09-14 NOTE — H&P (Signed)
[employer] [facility] [place] [facility]   PATIENT NAME: Ethan Alvarez    MR#:  [id number]  DATE OF BIRTH:  June 23, 1961  DATE OF ADMISSION:  09/10/2019  PRIMARY CARE PHYSICIAN: Mickey Farber, MD   REQUESTING/REFERRING PHYSICIAN: ED physician  CHIEF COMPLAINT:   Chief Complaint  Patient presents with  . Fall  . Leg Swelling    HISTORY OF PRESENT ILLNESS:  Ethan Alvarez  is a 58 y.o. male with a known history of schizophrenia, hypertension and recent right quadriceps tendon repair in May with resultant chronic right lower extremity swelling and hypertension who initially presented to the emergency room on 09/10/2019 with complaints of generalized weakness and frequent falls.  Patient apparently had been to the rehab facility after the surgery and hospitalization for the prior tendon repair.  Since returning home patient has been very unsteady on his gait and having multiple falls at home.  Patient determined not to be safe for discharge back home.  Emergency room providers and case managers have attempted to have patient placed in skilled nursing facility over the last 94 hours.  Social worker following patient and still waiting on insurance authorization.  Multiple imaging studies including CT scan of the head and C-spine done with no acute findings.  X-ray of the right knee years ago with no acute findings.   Medical service called to admit patient pending transfer to skilled nursing facility.  Repeat labs requested today.    PAST MEDICAL HISTORY:   Past Medical History:  Diagnosis Date  . Hypertension     PAST SURGICAL HISTORY:   Past Surgical History:  Procedure Laterality Date  . INCISION AND DRAINAGE Right 06/02/2019   Procedure: INCISION AND DRAINAGE;  Surgeon: Christena Flake, MD;  Location: [facility] ORS;  Service: Orthopedics;  Laterality: Right;  . QUADRICEPS TENDON REPAIR Right 04/25/2019   Procedure: REPAIR QUADRICEP TENDON;  Surgeon: Christena Flake, MD;  Location:  [facility] ORS;  Service: Orthopedics;  Laterality: Right;  . QUADRICEPS TENDON REPAIR Right 06/02/2019   Procedure: REPAIR QUADRICEP TENDON;  Surgeon: Christena Flake, MD;  Location: [facility] ORS;  Service: Orthopedics;  Laterality: Right;    SOCIAL HISTORY:   Social History   Tobacco Use  . Smoking status: Never Smoker  . Smokeless tobacco: Never Used  Substance Use Topics  . Alcohol use: Not Currently    FAMILY HISTORY:  No family history on file.  DRUG ALLERGIES:   Allergies  Allergen Reactions  . Haloperidol Lactate Other (See Comments)    Per [facility]; nature of intolerance unclear intolerance Other reaction(s): Other (See Comments) Per [facility]; nature of intolerance unclear     REVIEW OF SYSTEMS:   Review of Systems  Constitutional: Negative for chills and fever.  HENT: Negative for hearing loss and tinnitus.   Eyes: Negative for blurred vision and double vision.  Respiratory: Negative for cough and shortness of breath.   Cardiovascular: Negative for chest pain and palpitations.  Gastrointestinal: Negative for heartburn, nausea and vomiting.  Genitourinary: Negative for dysuria.  Musculoskeletal: Positive for myalgias.       Generalized weakness  Skin: Negative for itching and rash.  Neurological: Positive for weakness. Negative for dizziness.  Psychiatric/Behavioral: Negative for depression and hallucinations.    MEDICATIONS AT HOME:   Prior to Admission medications   Medication Sig Start Date End Date Taking? Authorizing Provider  ascorbic acid (VITAMIN C) 500 MG tablet Take 500 mg by mouth daily.   Yes [provider]  benztropine (COGENTIN) 0.5 MG tablet Take 0.5 mg by mouth every morning.    Yes [provider]  benztropine (COGENTIN) 1 MG tablet Take 1 mg by mouth 2 (two) times daily.  03/25/19  Yes [provider]  cholecalciferol (VITAMIN D3) 25 MCG (1000 UT) tablet Take 1,000 Units by mouth daily.   Yes [provider]  divalproex  (DEPAKOTE) 500 MG DR tablet Take 1,000 mg by mouth 2 (two) times a day. 02/13/17 09/10/19 Yes [provider]  INVEGA SUSTENNA 234 MG/1.5ML SUSY injection Inject 234 mg into the muscle every 30 (thirty) days. 04/10/19  Yes [provider]  LORazepam (ATIVAN) 0.5 MG tablet Take 0.5 mg by mouth every morning.    Yes [provider]  LORazepam (ATIVAN) 1 MG tablet Take 1 mg by mouth 2 (two) times daily.  01/23/19  Yes [provider]  OLANZapine (ZYPREXA) 15 MG tablet Take 15 mg by mouth daily. 03/25/19 09/10/19 Yes [provider]  OLANZapine (ZYPREXA) 5 MG tablet Take 5 mg by mouth daily.   Yes [provider]  propranolol (INDERAL) 20 MG tablet Take 20 mg by mouth daily.  08/12/18  Yes [provider]  sertraline (ZOLOFT) 100 MG tablet Take 100 mg by mouth 2 (two) times a day. 01/30/18 09/10/19 Yes [provider]  oxyCODONE (OXY IR/ROXICODONE) 5 MG immediate release tablet Take 1-2 tablets (5-10 mg total) by mouth every 4 (four) hours as needed for severe pain. Patient not taking: Reported on 09/10/2019 06/08/19   Mayo, Allyn KennerKaty Dodd, MD  polyethylene glycol (MIRALAX / GLYCOLAX) 17 g packet Take 17 g by mouth daily as needed for mild constipation. Patient not taking: Reported on 09/10/2019 06/08/19   Mayo, Allyn KennerKaty Dodd, MD  traMADol (ULTRAM) 50 MG tablet Take 1 tablet (50 mg total) by mouth every 6 (six) hours as needed for moderate pain. Patient not taking: Reported on 09/10/2019 06/06/19   Dedra SkeensMundy, Todd, PA-C      VITAL SIGNS:  Blood pressure 126/74, pulse 78, temperature 97.6 F (36.4 C), temperature source Oral, resp. rate 18, height 6' (1.829 m), weight (!) 158.8 kg, SpO2 98 %.  PHYSICAL EXAMINATION:  Physical Exam  GENERAL:  58 y.o.-year-old patient lying in the bed with no acute distress.  EYES: Pupils equal, round, reactive to light and accommodation. No scleral icterus. Extraocular muscles intact.  HEENT: Head atraumatic,  normocephalic. Oropharynx and nasopharynx clear.  NECK:  Supple, no jugular venous distention. No thyroid enlargement, no tenderness.  LUNGS: Normal breath sounds bilaterally, no wheezing, rales,rhonchi or crepitation. No use of accessory muscles of respiration.  CARDIOVASCULAR: S1, S2 normal. No murmurs, rubs, or gallops.  ABDOMEN: Soft, nontender, nondistended. Bowel sounds present. No organomegaly or mass.  EXTREMITIES: Splint in place to right lower extremity at site of prior right knee surgery.   cyanosis, or clubbing.  NEUROLOGIC: Generalized weakness.  Limitation of movement of right lower extremity due to prior right knee surgery and splint in place.  Gait not checked.  PSYCHIATRIC: The patient is alert and oriented x 3.  SKIN: No obvious rash, lesion, or ulcer.   LABORATORY PANEL:   CBC Recent Labs  Lab 09/10/19 1507  WBC 6.3  HGB 13.9  HCT 41.9  PLT 127*   ------------------------------------------------------------------------------------------------------------------  Chemistries  Recent Labs  Lab 09/10/19 1507  NA 137  K 4.3  CL 99  CO2 27  GLUCOSE 91  BUN 11  CREATININE 0.69  CALCIUM 8.9  AST 17  ALT  10  ALKPHOS 65  BILITOT 0.7   ------------------------------------------------------------------------------------------------------------------  Cardiac Enzymes No results for input(s): TROPONINI in the last 168 hours. ------------------------------------------------------------------------------------------------------------------  RADIOLOGY:  No results found.    IMPRESSION AND PLAN:  Patient is a 58 year old male with history of schizophrenia, hypertension and recent right quadriceps tendon repair in May with resultant chronic right lower extremity swelling and hypertension who initially presented to the emergency room with generalized weakness on frequent falls.  1.  Frequent falls Secondary to generalized weakness .  Imaging studies done with no  acute findings.  Patient has been in the emergency room for at least 94 hours awaiting skilled nursing facility placement. Social worker already working on placement.  Awaiting insurance authorization. Being admitted to the medical floor pending discharge to skilled nursing facility. Labs requested for today.  Awaiting results.  2.  Prior right knee surgery Patient was previously at the rehab and discharged home.  Not able to function well at home and having frequent falls. Physical therapy saw patient.  Recommended skilled nursing facility Case manager working on placement.  3.  Schizophrenia Stable.  Continue psych meds. Psychiatric consult noted to have been placed already in the emergency room.  DVT prophylaxis; Lovenox  All the records are reviewed and case discussed with ED provider. Management plans discussed with the patient, and he is in agreement.  CODE STATUS: Full code  TOTAL TIME TAKING CARE OF THIS PATIENT: 57 minutes.    Viviane Semidey M.D on 09/14/2019 at 2:44 PM  Between 7am to 6pm - Pager - [phone]  After 6pm go to [email or web address] - [employer] brewer [place] [facility]  Office  [phone]  CC: Primary care physician; Ezequiel Kayser, MD   Note: This dictation was prepared with Dragon dictation along with smaller phrase technology. Any transcriptional errors that result from this process are unintentional.

## 2019-09-14 NOTE — ED Notes (Signed)
ED TO INPATIENT HANDOFF REPORT  ED Nurse Name and Phone #: Corrie Dandymary [phone]  S Name/Age/Gender Ethan Alvarez M Ligman 58 y.o. male Room/Bed: ED15A/ED15A  Code Status   Code Status: Full Code  Home/SNF/Other Skilled nursing facility Patient oriented to: self, place, time and situation Is this baseline? Pt is aware of what is going on and where he is but asks same questions repeatedly  Triage Complete: Triage complete  Chief Complaint r knee pain  Triage Note He arrives today via [facility] from home  Pt reports experiencing a fall x2 in the last 24 hours  8/10 pain reported to his right leg  Peripheral edema present to right ankle  Pt reports edema present to this leg since "a DVT a few months ago"  2 reddened areas noted to forehead  Pt reports hitting his head with his fall last pm    Allergies Allergies  Allergen Reactions  . Haloperidol Lactate Other (See Comments)    Per [facility]; nature of intolerance unclear intolerance Other reaction(s): Other (See Comments) Per [facility]; nature of intolerance unclear     Level of Care/Admitting Diagnosis ED Disposition    ED Disposition Condition Comment   Admit  [facility] [[id number]]  Level of Care: Med-Surg [16]  Covid Evaluation: Confirmed COVID Negative  Diagnosis: Fall [290176]  Admitting Physician: [name], [name] [[id number]]  Attending Physician: [name], [name] [[id number]]  Estimated length of stay: past midnight tomorrow  Certification:: I certify this patient will need inpatient services for at least 2 midnights  PT Class (Do Not Modify): Inpatient [101]  PT Acc Code (Do Not Modify): Private [1]       B Medical/Surgery History Past Medical History:  Diagnosis Date  . Hypertension    Past Surgical History:  Procedure Laterality Date  . INCISION AND DRAINAGE Right 06/02/2019   Procedure: INCISION AND DRAINAGE;  Surgeon: Christena FlakePoggi, John J, MD;  Location: [facility] ORS;  Service: Orthopedics;  Laterality: Right;  . QUADRICEPS  TENDON REPAIR Right 04/25/2019   Procedure: REPAIR QUADRICEP TENDON;  Surgeon: Christena FlakePoggi, John J, MD;  Location: [facility] OR[facility];  Service: Orthopedics;  Laterality: Right;  . QUADRICEPS TENDON REPAIR Right 06/02/2019   Procedure: REPAIR QUADRICEP TENDON;  Surgeon: Christena FlakePoggi, John J, MD;  Location: [facility] OR[facility];  Service: Orthopedics;  Laterality: Right;     A IV Location/Drains/Wounds Patient Lines/Drains/Airways Status   Active Line/Drains/Airways    Name:   Placement date:   Placement time:   Site:   Days:   Peripheral IV 09/10/19 Right Hand   09/10/19    1508    Hand   4   PICC Single Lumen 06/08/19   06/08/19    1358    -   98   Incision (Closed) 04/25/19 Knee Right   04/25/19    1554     142   Incision (Closed) 06/02/19 Knee Right   06/02/19    1711     104          Intake/Output Last 24 hours  Intake/Output Summary (Last 24 hours) at 09/14/2019 1659 Last data filed at 09/14/2019 1549 Gross per 24 hour  Intake -  Output 700 ml  Net -700 ml    Labs/Imaging Results for orders placed or performed during the hospital encounter of 09/10/19 (from the past 48 hour(s))  SARS Coronavirus 2 [facility](Hospital order, Performed in [facility] lab) Nasopharyngeal Nasopharyngeal Swab     Status: None   Collection Time: 09/14/19  2:30 PM  Specimen: Nasopharyngeal Swab  Result Value Ref Range   SARS Coronavirus 2 NEGATIVE NEGATIVE    Comment: (NOTE) If result is NEGATIVE SARS-CoV-2 target nucleic acids are NOT DETECTED. The SARS-CoV-2 RNA is generally detectable in upper and lower  respiratory specimens during the acute phase of infection. The lowest  concentration of SARS-CoV-2 viral copies this assay can detect is 250  copies / mL. A negative result does not preclude SARS-CoV-2 infection  and should not be used as the sole basis for treatment or other  patient management decisions.  A negative result may occur with  improper specimen collection / handling, submission of specimen other  than  nasopharyngeal swab, presence of viral mutation(s) within the  areas targeted by this assay, and inadequate number of viral copies  (<250 copies / mL). A negative result must be combined with clinical  observations, patient history, and epidemiological information. If result is POSITIVE SARS-CoV-2 target nucleic acids are DETECTED. The SARS-CoV-2 RNA is generally detectable in upper and lower  respiratory specimens dur ing the acute phase of infection.  Positive  results are indicative of active infection with SARS-CoV-2.  Clinical  correlation with patient history and other diagnostic information is  necessary to determine patient infection status.  Positive results do  not rule out bacterial infection or co-infection with other viruses. If result is PRESUMPTIVE POSTIVE SARS-CoV-2 nucleic acids MAY BE PRESENT.   A presumptive positive result was obtained on the submitted specimen  and confirmed on repeat testing.  While 2019 novel coronavirus  (SARS-CoV-2) nucleic acids may be present in the submitted sample  additional confirmatory testing may be necessary for epidemiological  and / or clinical management purposes  to differentiate between  SARS-CoV-2 and other Sarbecovirus currently known to infect humans.  If clinically indicated additional testing with an alternate test  methodology [phone]) is advised. The SARS-CoV-2 RNA is generally  detectable in upper and lower respiratory sp ecimens during the acute  phase of infection. The expected result is Negative. Fact Sheet for Patients:  StrictlyIdeas.no Fact Sheet for Healthcare Providers: BankingDealers.co.za This test is not yet approved or cleared by the [place] FDA and has been authorized for detection and/or diagnosis of SARS-CoV-2 by FDA under an Emergency Use Authorization (EUA).  This EUA will remain in effect (meaning this test can be used) for the duration of  the COVID-19 declaration under Section 564(b)(1) of the Act, 21 U.S.C. section 360bbb-3(b)(1), unless the authorization is terminated or revoked sooner. Performed at [facility], [address] [place], [place] [address]   CBC     Status: Abnormal   Collection Time: 09/14/19  2:30 PM  Result Value Ref Range   WBC 4.3 4.0 - 10.5 K/uL   RBC 3.67 (L) 4.22 - 5.81 MIL/uL   Hemoglobin 11.9 (L) 13.0 - 17.0 g/dL   HCT 35.5 (L) 39.0 - 52.0 %   MCV 96.7 80.0 - 100.0 fL   MCH 32.4 26.0 - 34.0 pg   MCHC 33.5 30.0 - 36.0 g/dL   RDW 13.1 11.5 - 15.5 %   Platelets 121 (L) 150 - 400 K/uL   nRBC 0.0 0.0 - 0.2 %    Comment: Performed at [facility], [address] [place], [place] [address]  Basic metabolic panel     Status: Abnormal   Collection Time: 09/14/19  2:30 PM  Result Value Ref Range   Sodium 135 135 - 145 mmol/L   Potassium 4.1 3.5 - 5.1 mmol/L  Chloride 99 98 - 111 mmol/L   CO2 27 22 - 32 mmol/L   Glucose, Bld 94 70 - 99 mg/dL   BUN 10 6 - 20 mg/dL   Creatinine, Ser 0.76 (L) 0.61 - 1.24 mg/dL   Calcium 7.8 (L) 8.9 - 10.3 mg/dL   GFR calc non Af Amer >60 >60 mL/min   GFR calc Af Amer >60 >60 mL/min   Anion gap 9 5 - 15    Comment: Performed at [facility], [address] [place], [place] [address]   No results found.  Pending Labs Unresulted Labs (From admission, onward)    Start     Ordered   09/15/19 0500  Basic metabolic panel  Tomorrow morning,   STAT     09/14/19 1444   09/15/19 0500  CBC  Tomorrow morning,   STAT     09/14/19 1444   09/15/19 0500  Magnesium  Tomorrow morning,   STAT     09/14/19 1444   09/10/19 1452  CBC with Differential  Once,   STAT     09/10/19 1452          Vitals/Pain Today's Vitals   09/14/19 1009 09/14/19 1026 09/14/19 1531 09/14/19 1532  BP: 126/74  126/74 124/69  Pulse: 88 78 74 76  Resp: 18   16  Temp:    97.7 F (36.5 C)  TempSrc:    Oral  SpO2: 97% 98% 100%   Weight:      Height:       PainSc:    0-No pain    Isolation Precautions No active isolations  Medications Medications  benztropine (COGENTIN) tablet 0.5 mg (0.5 mg Oral Given 09/14/19 0823)  benztropine (COGENTIN) tablet 1 mg (1 mg Oral Given 09/14/19 1428)  divalproex (DEPAKOTE) DR tablet 1,000 mg (1,000 mg Oral Given 09/14/19 0822)  LORazepam (ATIVAN) tablet 0.5 mg (0.5 mg Oral Given 09/14/19 0822)  LORazepam (ATIVAN) tablet 1 mg (1 mg Oral Given 09/14/19 1428)  OLANZapine (ZYPREXA) tablet 15 mg (15 mg Oral Given 09/14/19 0821)  OLANZapine (ZYPREXA) tablet 5 mg (5 mg Oral Given 09/14/19 1427)  propranolol (INDERAL) tablet 20 mg (20 mg Oral Given 09/14/19 1009)  sertraline (ZOLOFT) tablet 100 mg (100 mg Oral Given 09/14/19 3545)  vitamin C (ASCORBIC ACID) tablet 500 mg (has no administration in time range)  cholecalciferol (VITAMIN D3) tablet 1,000 Units (has no administration in time range)  enoxaparin (LOVENOX) injection 40 mg (has no administration in time range)  acetaminophen (TYLENOL) tablet 1,000 mg (1,000 mg Oral Given 09/10/19 2007)  acetaminophen (TYLENOL) tablet 650 mg (650 mg Oral Given 09/11/19 0751)  LORazepam (ATIVAN) tablet 2 mg (2 mg Oral Given 09/12/19 1957)  LORazepam (ATIVAN) tablet 1 mg (1 mg Oral Given 09/13/19 0119)  acetaminophen (TYLENOL) tablet 650 mg (650 mg Oral Given 09/13/19 0123)    Mobility non-ambulatory High fall risk   Focused Assessments Cardiac Assessment Handoff:    Lab Results  Component Value Date   CKTOTAL 36 (L) 09/10/2019   TROPONINI <0.03 06/01/2019   No results found for: DDIMER Does the Patient currently have chest pain? No     R Recommendations: See Admitting Provider Note  Report given to:   Additional Notes: Pt to be transferred to SNF

## 2019-09-14 NOTE — Progress Notes (Signed)
PHARMACIST - PHYSICIAN COMMUNICATION  CONCERNING:  Enoxaparin (Lovenox) for DVT Prophylaxis   RECOMMENDATION: Patient was prescribed enoxaprin 40mg  q24 hours for VTE prophylaxis.   Filed Weights   09/10/19 1448  Weight: (!) 350 lb (158.8 kg)    Body mass index is 47.47 kg/m.  Estimated Creatinine Clearance: 160.6 mL/min (A) (by C-G formula based on SCr of 0.54 mg/dL (L)).   Based on [place] patient is candidate for enoxaparin 40mg  every 12 hour dosing due to BMI being >40.  DESCRIPTION: Pharmacy has adjusted enoxaparin dose per [facility] policy.  Patient is now receiving enoxaparin 40mg  every 12 hours.   Lu Duffel, PharmD, BCPS Clinical Pharmacist 09/14/2019 4:34 PM

## 2019-09-14 NOTE — ED Notes (Signed)
Pt's linens and gown changed as he voided through the brief. Peri care perfomed.

## 2019-09-14 NOTE — ED Notes (Signed)
Pt urinal emptied; 300 ml output

## 2019-09-14 NOTE — ED Notes (Signed)
This RN to bedside, apologized for delay in coming room. Pt states understanding. Pt resting in bed with lights dimmed for patient comfort. Pt denies any needs at this time. Will continue to monitor for further patient needs.

## 2019-09-14 NOTE — Social Work (Addendum)
[place] Patient's legal guardian, [name], decided that patient will go to SNF, and he is aware of the copay.  Olivia Mackie at [facility] notified. CSW asked to receive updated Covid test. EDP notified to order one.   SNF working on [employer] as well.   5 - CSW attempting to contact patient's guardian to let him know that patient has met deductible and will no longer have a copay for [facility] (per Olivia Mackie at [facility]).    Line busy for work number guardian provided [phone]). Will attempt again momentarily   1600- CSW shared with patient's sister, [name], that patient will not have copay at [facility]. [name] shared that patient's brother, [name], would prefer that CSW contact her, or his wife due to him being very busy.      [place], [place] ED  [phone]

## 2019-09-14 NOTE — Consult Note (Signed)
[facility] Face-to-Face Psychiatry Consult   Reason for Consult: Medication review, history of psychiatric conditions Referring Physician: EDP Patient Identification: Ethan Alvarez MRN:  [id number] Principal Diagnosis: <principal problem not specified> Diagnosis:  Active Problems:   Fall   Total Time spent with patient: 45 minutes  Subjective:   Ethan Alvarez is a 58 y.o. male patient admitted following a fall from a nursing home.   HPI: Patient is a 58 year old male with history of schizophrenia and intellectual disability who presents following a fall in the nursing home.  Per the patient his current situation makes him quite sad.  He goes on to say that he is not happy where he is in life.  He attributes this to himself, but remains future oriented regarding his education and job outlook.  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  Patient states that he does occasionally hear voices some of which can be distressing.  He reports hearing voices stating "spit out hell".  Patient states that he has heard this voice for a month now and he is able to ignore it.  Patient states that he also from time to time will have low mood.  He does endorse occasional suicidal ideation, however this is somewhat chronic for him and something that is well discussed with his therapist.  Patient denies any current suicidal ideation and remains future oriented.  Patient denies any current psychosis or audio or visual hallucinations, paranoia, delusions.  Past Psychiatric History: Notable for schizophrenia and intellectual disability Risk to Self:  No Risk to Others:  No Prior Inpatient Therapy:  No Prior Outpatient Therapy:  Yes  Past Medical History:  Past Medical History:  Diagnosis Date  . Hypertension     Past Surgical History:  Procedure Laterality Date  . INCISION AND DRAINAGE Right 06/02/2019   Procedure: INCISION AND DRAINAGE;  Surgeon:  Christena Flake, MD;  Location: [facility] ORS;  Service: Orthopedics;  Laterality: Right;  . QUADRICEPS TENDON REPAIR Right 04/25/2019   Procedure: REPAIR QUADRICEP TENDON;  Surgeon: Christena Flake, MD;  Location: [facility] ORS;  Service: Orthopedics;  Laterality: Right;  . QUADRICEPS TENDON REPAIR Right 06/02/2019   Procedure: REPAIR QUADRICEP TENDON;  Surgeon: Christena Flake, MD;  Location: [facility] ORS;  Service: Orthopedics;  Laterality: Right;   Family History: No family history on file. Family Psychiatric  History: Noncontributory Social History:  Social History   Substance and Sexual Activity  Alcohol Use Not Currently     Social History   Substance and Sexual Activity  Drug Use Not Currently    Social History   Socioeconomic History  . Marital status: Single    Spouse name: Not on file  . Number of children: Not on file  . Years of education: Not on file  . Highest education level: Not on file  Occupational History  . Not on file  Social Needs  . Financial resource strain: Not on file  . Food insecurity    Worry: Not on file    Inability: Not on file  . Transportation needs    Medical: Not on file    Non-medical: Not on file  Tobacco Use  . Smoking status: Never Smoker  . Smokeless tobacco: Never Used  Substance and Sexual Activity  . Alcohol use: Not Currently  . Drug use: Not Currently  . Sexual activity: Not Currently  Lifestyle  . Physical activity  Days per week: Not on file    Minutes per session: Not on file  . Stress: Not on file  Relationships  . Social Herbalist on phone: Not on file    Gets together: Not on file    Attends religious service: Not on file    Active member of club or organization: Not on file    Attends meetings of clubs or organizations: Not on file    Relationship status: Not on file  Other Topics Concern  . Not on file  Social History Narrative  . Not on file   Additional Social History:  Patient reports seeing his brothers  and sisters occasionally.  He reports enjoying watching TV and talking to his residents at the nursing home.  Allergies:   Allergies  Allergen Reactions  . Haloperidol Lactate Other (See Comments)    Per [facility]; nature of intolerance unclear intolerance Other reaction(s): Other (See Comments) Per [facility]; nature of intolerance unclear     Labs: No results found for this or any previous visit (from the past 48 hour(s)).  Current Facility-Administered Medications  Medication Dose Route Frequency Provider Last Rate Last Dose  . benztropine (COGENTIN) tablet 0.5 mg  0.5 mg Oral Monia Sabal., MD   0.5 mg at 09/14/19 1610  . benztropine (COGENTIN) tablet 1 mg  1 mg Oral BID Lilia Pro., MD   1 mg at 09/14/19 1428  . cholecalciferol (VITAMIN D3) tablet 1,000 Units  1,000 Units Oral Daily Ojie, [name], MD      . divalproex (DEPAKOTE) DR tablet 1,000 mg  1,000 mg Oral Q12H Lilia Pro., MD   1,000 mg at 09/14/19 9604  . enoxaparin (LOVENOX) injection 40 mg  40 mg Subcutaneous Q24H Ojie, [name], MD      . LORazepam (ATIVAN) tablet 0.5 mg  0.5 mg Oral Rosemarie Beath, Damaris Hippo., MD   0.5 mg at 09/14/19 5409  . LORazepam (ATIVAN) tablet 1 mg  1 mg Oral BID Lilia Pro., MD   1 mg at 09/14/19 1428  . OLANZapine (ZYPREXA) tablet 15 mg  15 mg Oral Daily Lilia Pro., MD   15 mg at 09/14/19 8119  . OLANZapine (ZYPREXA) tablet 5 mg  5 mg Oral Daily Lilia Pro., MD   5 mg at 09/14/19 1427  . propranolol (INDERAL) tablet 20 mg  20 mg Oral Daily Lilia Pro., MD   20 mg at 09/14/19 1009  . sertraline (ZOLOFT) tablet 100 mg  100 mg Oral BID Lilia Pro., MD   100 mg at 09/14/19 1478  . vitamin C (ASCORBIC ACID) tablet 500 mg  500 mg Oral Daily Ojie, [name], MD       Current Outpatient Medications  Medication Sig Dispense Refill  . ascorbic acid (VITAMIN C) 500 MG tablet Take 500 mg by mouth daily.    . benztropine (COGENTIN) 0.5 MG tablet Take 0.5 mg by mouth every morning.     .  benztropine (COGENTIN) 1 MG tablet Take 1 mg by mouth 2 (two) times daily.     . cholecalciferol (VITAMIN D3) 25 MCG (1000 UT) tablet Take 1,000 Units by mouth daily.    . divalproex (DEPAKOTE) 500 MG DR tablet Take 1,000 mg by mouth 2 (two) times a day.    Lorayne Bender SUSTENNA 234 MG/1.5ML SUSY injection Inject 234 mg into the muscle every 30 (thirty) days.    Marland Kitchen LORazepam (ATIVAN) 0.5 MG tablet Take  0.5 mg by mouth every morning.     Marland Kitchen. LORazepam (ATIVAN) 1 MG tablet Take 1 mg by mouth 2 (two) times daily.     Marland Kitchen. OLANZapine (ZYPREXA) 15 MG tablet Take 15 mg by mouth daily.    Marland Kitchen. OLANZapine (ZYPREXA) 5 MG tablet Take 5 mg by mouth daily.    . propranolol (INDERAL) 20 MG tablet Take 20 mg by mouth daily.     . sertraline (ZOLOFT) 100 MG tablet Take 100 mg by mouth 2 (two) times a day.    . oxyCODONE (OXY IR/ROXICODONE) 5 MG immediate release tablet Take 1-2 tablets (5-10 mg total) by mouth every 4 (four) hours as needed for severe pain. (Patient not taking: Reported on 09/10/2019) 30 tablet 0  . polyethylene glycol (MIRALAX / GLYCOLAX) 17 g packet Take 17 g by mouth daily as needed for mild constipation. (Patient not taking: Reported on 09/10/2019) 14 each 0  . traMADol (ULTRAM) 50 MG tablet Take 1 tablet (50 mg total) by mouth every 6 (six) hours as needed for moderate pain. (Patient not taking: Reported on 09/10/2019) 30 tablet 1    Musculoskeletal: Strength & Muscle Tone: decreased Gait & Station: unable to stand Patient leans: N/A  Psychiatric Specialty Exam: Physical Exam  ROS  Blood pressure 126/74, pulse 78, temperature 97.6 F (36.4 C), temperature source Oral, resp. rate 18, height 6' (1.829 m), weight (!) 158.8 kg, SpO2 98 %.Body mass index is 47.47 kg/m.  General Appearance: Fairly Groomed  Eye Contact:  Good  Speech:  Slow  Volume:  Increased  Mood:  Dysphoric  Affect:  Flat  Thought Process:  Linear  Orientation:  Full (Time, Place, and Person)  Thought Content:  Logical   Suicidal Thoughts:  Yes.  without intent/plan  Homicidal Thoughts:  No  Memory:  Remote;   Fair  Judgement:  Impaired  Insight:  Fair  Psychomotor Activity:  Normal and Decreased  Concentration:  Concentration: Fair  Recall:  FiservFair  Fund of Knowledge:  Poor  Language:  Fair  Akathisia:  No  Handed:  Right  AIMS (if indicated):     Assets:  Manufacturing systems engineerCommunication Skills Social Support  ADL's:  Impaired  Cognition:  Impaired,  Moderate  Sleep:        Treatment Plan Summary: Assessment: Patient is a 58 year old male with intellectual disability and schizophrenia formally residing in nursing home who presents following a fall.  Patient currently on max dose of multiple psychiatric medications which seem to be adequately controlling his symptoms at this time.  Medication management: Diagnosis: Schizophrenia Continue with Depakote 1000 mg twice daily Continue with olanzapine 15 mg every morning, 5 mg q. 1 PM Continue with sertraline 100 mg twice daily Continue with Inderal 20 mg daily Continue with Cogentin 1 mg twice daily   Disposition: No evidence of imminent risk to self or others at present.   Patient does not meet criteria for psychiatric inpatient admission. Supportive therapy provided about ongoing stressors.  Clement SayresPaul A Nyaisha Simao, MD 09/14/2019 2:45 PM

## 2019-09-14 NOTE — ED Notes (Signed)
Patient asleep.

## 2019-09-14 NOTE — Social Work (Addendum)
Due to patient receiving insurance authorization late in the evening, there was not enough time for patient to be transported to [facility].  CSW consulted with Dr. Stark Jock, and stated that patient will be ready for transport to [facility] tomorrow morning.   Covid test results have been faxed to [facility].     [place], [place] ED  [phone]

## 2019-09-14 NOTE — ED Notes (Signed)
Patient awake, asking for snack. Patient given one package of graham crackers; patient denies needing new brief at this time

## 2019-09-14 NOTE — ED Notes (Signed)
Pt took meds without difficulty, pt requesting shasta coke at this time.

## 2019-09-14 NOTE — ED Notes (Signed)
Pt given a shasta coke at this time, states his brief is wet, requesting to be changed after he finishes his shasta coke. Will change patient after he is done drinking his coke.

## 2019-09-14 NOTE — ED Notes (Signed)
Medications administered as ordered. Pt tolerated well. Pt repositioned in bed, given a breakfast tray. Pt denies further needs at this time. States understanding of call bell use. Will continue to monitor for further patient needs.

## 2019-09-14 NOTE — ED Notes (Signed)
Patient's brief and linens changed, barrier cream applied to skin. Allevyn foam in place. Diaper placed loosely around patient due to skin irritation. Patient says he "knows when he has to go", so instructed to use the urinal when possible to prevent further irritation. Patient repositioned with pillow wedge under R side; pillow placed between legs. Patient expresses that he is comfortable. Provided with OJ, vitals obtained

## 2019-09-14 NOTE — ED Notes (Signed)
This RN and Elmyra Ricks EDT to bedside to change soiled brief. Pt states "I'm sorry my penis is hard, it gets like that sometimes." Pt given urinal to urinate. Pt placed into clean gown at this time due to his gown being soiled.

## 2019-09-14 NOTE — ED Notes (Signed)
Report given to daytime RN, given history and plan for [facility] hopefully today with authorization. Informed of skin irritation

## 2019-09-14 NOTE — Social Work (Addendum)
Olivia Mackie at [facility] is looking into patient's benefits, and how many days [facility] Medicare will pay for SNF.  Patient will need another COVID test if he is able to discharge to SNF.    8 - CSW spoke with patient's guardian and brother stating that patient in now in his copay days and it will cost $3500 for him to go to SNF.  Patient's guardian shared that he would like to speak with patient's nurse to see if he can walk to the bathroom before deciding for patient to go home with home health.  CSW provided guardian with contact information for ED.    [place], [place] ED  [phone]

## 2019-09-14 NOTE — ED Notes (Signed)
Pt given lunch tray and shasta cola

## 2019-09-15 LAB — BASIC METABOLIC PANEL
Anion gap: 11 (ref 5–15)
BUN: 12 mg/dL (ref 6–20)
CO2: 26 mmol/L (ref 22–32)
Calcium: 8.5 mg/dL — ABNORMAL LOW (ref 8.9–10.3)
Chloride: 98 mmol/L (ref 98–111)
Creatinine, Ser: 0.78 mg/dL (ref 0.61–1.24)
GFR calc Af Amer: >60 mL/min (ref >60)
GFR calc non Af Amer: >60 mL/min (ref >60)
Glucose, Bld: 97 mg/dL (ref 70–99)
Potassium: 4.2 mmol/L (ref 3.5–5.1)
Sodium: 135 mmol/L (ref 135–145)

## 2019-09-15 LAB — CBC
HCT: 39.1 % (ref 39.0–52.0)
Hemoglobin: 13.1 g/dL (ref 13.0–17.0)
MCH: 31.6 pg (ref 26.0–34.0)
MCHC: 33.5 g/dL (ref 30.0–36.0)
MCV: 94.2 fL (ref 80.0–100.0)
Platelets: 144 10*3/uL — ABNORMAL LOW (ref 150–400)
RBC: 4.15 MIL/uL — ABNORMAL LOW (ref 4.22–5.81)
RDW: 13.3 % (ref 11.5–15.5)
WBC: 5 10*3/uL (ref 4.0–10.5)
nRBC: 0 % (ref 0.0–0.2)

## 2019-09-15 LAB — MAGNESIUM: Magnesium: 2.1 mg/dL (ref 1.7–2.4)

## 2019-09-15 MED ORDER — LORAZEPAM 1 MG PO TABS: 1.0000 mg | ORAL_TABLET | Freq: Two times a day (BID) | ORAL | 0 refills | Status: DC

## 2019-09-15 NOTE — Progress Notes (Addendum)
Pt slept through the night but has woken up at this time talking about aliens and alien machines and how he feels scared, starting to have paranoia, states that he wants to go home and he doesn't want to be here, also talking to himself

## 2019-09-15 NOTE — TOC Transition Note (Signed)
Transition of Care [facility]) - CM/SW Discharge Note   Patient Details  Name: Ethan Alvarez MRN: [id number] Date of Birth: 1961/04/16  Transition of Care [facility] CM/SW Contact:  Candie Chroman, LCSW Phone Number: 09/15/2019, 10:34 AM   Clinical Narrative: Patient has orders to discharge to [facility] today. Sister is aware. RN will call report to [phone] (Room 908) prior to calling EMS. No further concerns. CSW signing off.    Final next level of care: Skilled Nursing Facility Barriers to Discharge: Barriers Resolved   Patient Goals and CMS Choice Patient states their goals for this hospitalization and ongoing recovery are:: Per sister-in-law "he needs more rehab." CMS Medicare.gov Compare Post Acute Care list provided to:: Legal Guardian Choice offered to / list presented to : [place] / Guardian  Discharge Placement   Existing PASRR number confirmed : 09/11/19          Patient chooses bed at: [facility] Patient to be transferred to facility by: EMS Name of family member notified: Janine Limbo Patient and family notified of of transfer: 09/15/19  Discharge Plan and Services     Post Acute Care Choice: [place]                               Social Determinants of Health (SDOH) Interventions     Readmission Risk Interventions Readmission Risk Prevention Plan 04/26/2019  Medication Screening Complete  Transportation Screening Complete  Some recent data might be hidden

## 2019-09-15 NOTE — Progress Notes (Signed)
Called [facility] gave report to [place]. Patient is going to room 908. Discharge is complete and EMS has been called. IV has been removed.

## 2019-09-15 NOTE — Discharge Summary (Addendum)
Ethan Alvarez, is a 58 y.o. male  DOB 02/13/1961  MRN [id number].  Admission date:  09/10/2019  Admitting Physician  [name] Enid Baasjie, MD  Discharge Date:  09/15/2019   Primary MD  Mickey Farberhies, David, MD  Recommendations for primary care physician for things to follow:  Follow-up with PCP in 1 week   Admission Diagnosis  Generalized weakness [R53.1] Fall in home, initial encounter [W19.Lorne SkeensXXXA, Y92.009]   Discharge Diagnosis  Generalized weakness [R53.1] Fall in home, initial encounter [W19.Lorne SkeensXXXA, Y92.009]    Active Problems:   Fall      Past Medical History:  Diagnosis Date  . Hypertension     Past Surgical History:  Procedure Laterality Date  . INCISION AND DRAINAGE Right 06/02/2019   Procedure: INCISION AND DRAINAGE;  Surgeon: Christena FlakePoggi, John J, MD;  Location: [facility] ORS;  Service: Orthopedics;  Laterality: Right;  . QUADRICEPS TENDON REPAIR Right 04/25/2019   Procedure: REPAIR QUADRICEP TENDON;  Surgeon: Christena FlakePoggi, John J, MD;  Location: [facility] OR[facility];  Service: Orthopedics;  Laterality: Right;  . QUADRICEPS TENDON REPAIR Right 06/02/2019   Procedure: REPAIR QUADRICEP TENDON;  Surgeon: Christena FlakePoggi, John J, MD;  Location: [facility] OR[facility];  Service: Orthopedics;  Laterality: Right;       History of present illness and  Hospital Course:     Kindly see H&P for history of present illness and admission details, please review complete Labs, Consult reports and Test reports for all details in brief  HPI  from the history and physical done on the day of admission 58 year old male with history of schizophrenia admitted yesterday night for placement.  Patient has been having generalized weakness, multiple falls, recent history of quadriceps tendon rupture , MSSA bacteremia, admitted here.    Hospital Course  1.  Frequent falls secondary to generalized  weakness, patient seen by physical therapy recommended rehab, [facility].  COVID-19 test is negative  #2 schizophrenia, patient is seen by psychiatry.  Patient has intellectual disability, schizophrenia, on max dose of multiple psychiatric medications, continue Depakote 1 g p.o. twice daily, olanzapine 15 mg in the morning, 5 mg in the afternoon, Zoloft 100 mg p.o. twice daily, Inderal 20 mg daily, Cogentin 1 mg p.o. twice daily.   Recent quadriceps tendon rupture, status post tendon repair.,  Previously at rehab and discharged home but at home patient did not function well having multiple falls, at home, physical therapy no recommends skilled nursing. Discharge Condition: Stable   Follow UP  Follow-up Information    Mickey Farberhies, David, MD. Schedule an appointment as soon as possible for a visit in 1 week(s).   Specialty: Internal Medicine Contact information: [address] [facility] [place] [id number] [phone]             Discharge Instructions  and  Discharge Medications      Allergies as of 09/15/2019      Reactions   Haloperidol Lactate Other (See Comments)   Per [facility]; nature of intolerance unclear intolerance Other reaction(s): Other (See Comments) Per [facility]; nature of intolerance unclear      Medication List    STOP taking these medications   oxyCODONE 5 MG immediate release tablet Commonly known as: Oxy IR/ROXICODONE   traMADol 50 MG tablet Commonly known as: ULTRAM     TAKE these medications   ascorbic acid 500 MG tablet Commonly known as: VITAMIN C Take 500 mg by mouth daily.   benztropine 0.5 MG tablet Commonly known as:  COGENTIN Take 0.5 mg by mouth every morning.   benztropine 1 MG tablet Commonly known as: COGENTIN Take 1 mg by mouth 2 (two) times daily.   cholecalciferol 25 MCG (1000 UT) tablet Commonly known as: VITAMIN D3 Take 1,000 Units by mouth daily.   divalproex 500 MG DR tablet Commonly  known as: DEPAKOTE Take 1,000 mg by mouth 2 (two) times a day.   Lorayne Bender Sustenna 234 MG/1.5ML Susy injection Generic drug: paliperidone Inject 234 mg into the muscle every 30 (thirty) days.   LORazepam 1 MG tablet Commonly known as: ATIVAN Take 1 tablet (1 mg total) by mouth 2 (two) times daily. What changed: Another medication with the same name was removed. Continue taking this medication, and follow the directions you see here.   OLANZapine 5 MG tablet Commonly known as: ZYPREXA Take 5 mg by mouth daily.   OLANZapine 15 MG tablet Commonly known as: ZYPREXA Take 15 mg by mouth daily.   polyethylene glycol 17 g packet Commonly known as: MIRALAX / GLYCOLAX Take 17 g by mouth daily as needed for mild constipation.   propranolol 20 MG tablet Commonly known as: INDERAL Take 20 mg by mouth daily.   sertraline 100 MG tablet Commonly known as: ZOLOFT Take 100 mg by mouth 2 (two) times a day.         Diet and Activity recommendation: See Discharge Instructions above   Consults obtained -psychiatry   Major procedures and Radiology Reports - PLEASE review detailed and final reports for all details, in brief -      Dg Chest 2 View  Result Date: 09/10/2019 CLINICAL DATA:  Pt reports experiencing a fall x2 in the last 24 hours 8/10 pain reported to his right leg Peripheral edema present to right ankle Pt reports edema present to this leg since "a DVT a few months ago" 2 reddened areas noted to forehead Pt reports hitting his head with his fall last pm Never a smoker, Hx HTNPt denies chest complaints EXAM: CHEST - 2 VIEW COMPARISON:  06/01/2019 FINDINGS: Cardiac silhouette is normal in size. No mediastinal or hilar masses. No evidence of adenopathy. Mild linear atelectasis or scarring in the left lung base. Lungs otherwise clear. No pleural effusion or pneumothorax. Skeletal structures are demineralized but grossly intact. IMPRESSION: No active cardiopulmonary disease.  Electronically Signed   By: Lajean Manes M.D.   On: 09/10/2019 17:40   Ct Head Wo Contrast  Result Date: 09/10/2019 CLINICAL DATA:  58 year old male with a history of fall EXAM: CT HEAD WITHOUT CONTRAST CT CERVICAL SPINE WITHOUT CONTRAST TECHNIQUE: Multidetector CT imaging of the head and cervical spine was performed following the standard protocol without intravenous contrast. Multiplanar CT image reconstructions of the cervical spine were also generated. COMPARISON:  None. FINDINGS: CT HEAD FINDINGS Brain: No acute intracranial hemorrhage. No midline shift or mass effect. Gray-white differentiation maintained. Unremarkable appearance of the ventricular system. Vascular: Unremarkable Skull: No acute fracture.  No aggressive bone lesion identified. Sinuses/Orbits: Unremarkable appearance of the orbits. Mastoid air cells clear. No middle ear effusion. No significant sinus disease. Other: None CT CERVICAL SPINE FINDINGS Alignment: Craniocervical junction aligned. Anatomic alignment of the cervical elements. No subluxation. Skull base and vertebrae: No acute fracture at the skullbase. Vertebral body heights relatively maintained. No acute fracture identified. Soft tissues and spinal canal: Unremarkable cervical soft tissues. Lymph nodes are present, though not enlarged. Disc levels: Mild disc disease of C2-C3 and C3-C4. No bony canal narrowing. Upper chest: Unremarkable appearance of  the lung apices. Other: No bony canal narrowing. IMPRESSION: Head CT: No acute intracranial abnormality. Cervical CT: No acute fracture or malalignment of the cervical spine. Electronically Signed   By: Gilmer Mor D.O.   On: 09/10/2019 15:47   Ct Cervical Spine Wo Contrast  Result Date: 09/10/2019 CLINICAL DATA:  58 year old male with a history of fall EXAM: CT HEAD WITHOUT CONTRAST CT CERVICAL SPINE WITHOUT CONTRAST TECHNIQUE: Multidetector CT imaging of the head and cervical spine was performed following the standard protocol  without intravenous contrast. Multiplanar CT image reconstructions of the cervical spine were also generated. COMPARISON:  None. FINDINGS: CT HEAD FINDINGS Brain: No acute intracranial hemorrhage. No midline shift or mass effect. Gray-white differentiation maintained. Unremarkable appearance of the ventricular system. Vascular: Unremarkable Skull: No acute fracture.  No aggressive bone lesion identified. Sinuses/Orbits: Unremarkable appearance of the orbits. Mastoid air cells clear. No middle ear effusion. No significant sinus disease. Other: None CT CERVICAL SPINE FINDINGS Alignment: Craniocervical junction aligned. Anatomic alignment of the cervical elements. No subluxation. Skull base and vertebrae: No acute fracture at the skullbase. Vertebral body heights relatively maintained. No acute fracture identified. Soft tissues and spinal canal: Unremarkable cervical soft tissues. Lymph nodes are present, though not enlarged. Disc levels: Mild disc disease of C2-C3 and C3-C4. No bony canal narrowing. Upper chest: Unremarkable appearance of the lung apices. Other: No bony canal narrowing. IMPRESSION: Head CT: No acute intracranial abnormality. Cervical CT: No acute fracture or malalignment of the cervical spine. Electronically Signed   By: Gilmer Mor D.O.   On: 09/10/2019 15:47   US Venous Img Lower Unilateral Right  Result Date: 09/09/2019 CLINICAL DATA:  58 year old male with right lower extremity swelling EXAM: RIGHT LOWER EXTREMITY VENOUS DOPPLER ULTRASOUND TECHNIQUE: Gray-scale sonography with graded compression, as well as color Doppler and duplex ultrasound were performed to evaluate the lower extremity deep venous systems from the level of the common femoral vein and including the common femoral, femoral, profunda femoral, popliteal and calf veins including the posterior tibial, peroneal and gastrocnemius veins when visible. The superficial great saphenous vein was also interrogated. Spectral Doppler was  utilized to evaluate flow at rest and with distal augmentation maneuvers in the common femoral, femoral and popliteal veins. COMPARISON:  None. FINDINGS: Contralateral Common Femoral Vein: Respiratory phasicity is normal and symmetric with the symptomatic side. No evidence of thrombus. Normal compressibility. Common Femoral Vein: No evidence of thrombus. Normal compressibility, respiratory phasicity and response to augmentation. Saphenofemoral Junction: No evidence of thrombus. Normal compressibility and flow on color Doppler imaging. Profunda Femoral Vein: No evidence of thrombus. Normal compressibility and flow on color Doppler imaging. Femoral Vein: No evidence of thrombus. Normal compressibility, respiratory phasicity and response to augmentation. Popliteal Vein: No evidence of thrombus. Normal compressibility, respiratory phasicity and response to augmentation. Calf Veins: No evidence of thrombus. Normal compressibility and flow on color Doppler imaging. Superficial Great Saphenous Vein: No evidence of thrombus. Normal compressibility and flow on color Doppler imaging. Other Findings:  None. IMPRESSION: Sonographic survey of the right lower extremity negative for DVT Electronically Signed   By: Gilmer Mor D.O.   On: 09/09/2019 15:46   Dg Knee Complete 4 Views Right  Result Date: 09/10/2019 CLINICAL DATA:  Status post fall x2 in the last 24 hours. Pain of right knee. EXAM: RIGHT KNEE - COMPLETE 4+ VIEW COMPARISON:  None. FINDINGS: No evidence of fracture, dislocation. Small suprapatellar effusion is noted. Mild osteophytosis and tibial spine and superior patella are identified. Soft tissues  are unremarkable. IMPRESSION: No acute fracture or dislocation. 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 Electronically Signed   By: Sherian Rein M.D.   On: 09/10/2019 15:51    Micro Results     Recent Results (from the past 240 hour(s))  SARS Coronavirus 2 [facility] order, Performed in [facility]  hospital lab) Nasopharyngeal Nasopharyngeal Swab     Status: None   Collection Time: 09/10/19  6:48 PM   Specimen: Nasopharyngeal Swab  Result Value Ref Range Status   SARS Coronavirus 2 NEGATIVE NEGATIVE Final    Comment: (NOTE) If result is NEGATIVE SARS-CoV-2 target nucleic acids are NOT DETECTED. The SARS-CoV-2 RNA is generally detectable in upper and lower  respiratory specimens during the acute phase of infection. The lowest  concentration of SARS-CoV-2 viral copies this assay can detect is 250  copies / mL. A negative result does not preclude SARS-CoV-2 infection  and should not be used as the sole basis for treatment or other  patient management decisions.  A negative result may occur with  improper specimen collection / handling, submission of specimen other  than nasopharyngeal swab, presence of viral mutation(s) within the  areas targeted by this assay, and inadequate number of viral copies  (<250 copies / mL). A negative result must be combined with clinical  observations, patient history, and epidemiological information. If result is POSITIVE SARS-CoV-2 target nucleic acids are DETECTED. The SARS-CoV-2 RNA is generally detectable in upper and lower  respiratory specimens dur ing the acute phase of infection.  Positive  results are indicative of active infection with SARS-CoV-2.  Clinical  correlation with patient history and other diagnostic information is  necessary to determine patient infection status.  Positive results do  not rule out bacterial infection or co-infection with other viruses. If result is PRESUMPTIVE POSTIVE SARS-CoV-2 nucleic acids MAY BE PRESENT.   A presumptive positive result was obtained on the submitted specimen  and confirmed on repeat testing.  While 2019 novel coronavirus  (SARS-CoV-2) nucleic acids may be present in the submitted sample  additional confirmatory testing may be necessary for epidemiological  and / or clinical management  purposes  to differentiate between  SARS-CoV-2 and other Sarbecovirus currently known to infect humans.  If clinically indicated additional testing with an alternate test  methodology [phone]) is advised. The SARS-CoV-2 RNA is generally  detectable in upper and lower respiratory sp ecimens during the acute  phase of infection. The expected result is Negative. Fact Sheet for Patients:  BoilerBrush.com.cy Fact Sheet for Healthcare Providers: https://pope.com/ This test is not yet approved or cleared by the [place] FDA and has been authorized for detection and/or diagnosis of SARS-CoV-2 by FDA under an Emergency Use Authorization (EUA).  This EUA will remain in effect (meaning this test can be used) for the duration of the COVID-19 declaration under Section 564(b)(1) of the Act, 21 U.S.C. section 360bbb-3(b)(1), unless the authorization is terminated or revoked sooner. Performed at [facility], [address] [place], [place] [address]   SARS Coronavirus 2 [facility] order, Performed in [facility] hospital lab) Nasopharyngeal Nasopharyngeal Swab     Status: None   Collection Time: 09/14/19  2:30 PM   Specimen: Nasopharyngeal Swab  Result Value Ref Range Status   SARS Coronavirus 2 NEGATIVE NEGATIVE Final    Comment: (NOTE) If result is NEGATIVE SARS-CoV-2 target nucleic acids are NOT DETECTED. The SARS-CoV-2 RNA is generally detectable in upper and lower  respiratory specimens during the acute phase of  infection. The lowest  concentration of SARS-CoV-2 viral copies this assay can detect is 250  copies / mL. A negative result does not preclude SARS-CoV-2 infection  and should not be used as the sole basis for treatment or other  patient management decisions.  A negative result may occur with  improper specimen collection / handling, submission of specimen other  than nasopharyngeal swab, presence of viral mutation(s)  within the  areas targeted by this assay, and inadequate number of viral copies  (<250 copies / mL). A negative result must be combined with clinical  observations, patient history, and epidemiological information. If result is POSITIVE SARS-CoV-2 target nucleic acids are DETECTED. The SARS-CoV-2 RNA is generally detectable in upper and lower  respiratory specimens dur ing the acute phase of infection.  Positive  results are indicative of active infection with SARS-CoV-2.  Clinical  correlation with patient history and other diagnostic information is  necessary to determine patient infection status.  Positive results do  not rule out bacterial infection or co-infection with other viruses. If result is PRESUMPTIVE POSTIVE SARS-CoV-2 nucleic acids MAY BE PRESENT.   A presumptive positive result was obtained on the submitted specimen  and confirmed on repeat testing.  While 2019 novel coronavirus  (SARS-CoV-2) nucleic acids may be present in the submitted sample  additional confirmatory testing may be necessary for epidemiological  and / or clinical management purposes  to differentiate between  SARS-CoV-2 and other Sarbecovirus currently known to infect humans.  If clinically indicated additional testing with an alternate test  methodology [phone]) is advised. The SARS-CoV-2 RNA is generally  detectable in upper and lower respiratory sp ecimens during the acute  phase of infection. The expected result is Negative. Fact Sheet for Patients:  BoilerBrush.com.cy Fact Sheet for Healthcare Providers: https://pope.com/ This test is not yet approved or cleared by the [place] FDA and has been authorized for detection and/or diagnosis of SARS-CoV-2 by FDA under an Emergency Use Authorization (EUA).  This EUA will remain in effect (meaning this test can be used) for the duration of the COVID-19 declaration under Section 564(b)(1) of the Act,  21 U.S.C. section 360bbb-3(b)(1), unless the authorization is terminated or revoked sooner. Performed at [facility], [address], [place], [place] [address]        Today   Subjective:   Ethan Alvarez is stable for discharge.  Objective:   Blood pressure 124/70, pulse 93, temperature 98.8 F (37.1 C), resp. rate 18, height 6' (1.829 m), weight (!) 158.7 kg, SpO2 93 %.   Intake/Output Summary (Last 24 hours) at 09/15/2019 0956 Last data filed at 09/15/2019 0500 Gross per 24 hour  Intake -  Output 2150 ml  Net -2150 ml    Exam Awake Alert, Oriented x 3, No new F.N deficits, Normal affect Watchtower.AT,PERRAL Supple Neck,No JVD, No cervical lymphadenopathy appriciated.  Symmetrical Chest wall movement, Good air movement bilaterally, CTAB RRR,No Gallops,Rubs or new Murmurs, No Parasternal Heave +ve B.Sounds, Abd Soft, Non tender, No organomegaly appriciated, No rebound -guarding or rigidity. No Cyanosis, Clubbing or edema, No new Rash or bruise  Data Review   CBC w Diff:  Lab Results  Component Value Date   WBC 5.0 09/15/2019   HGB 13.1 09/15/2019   HCT 39.1 09/15/2019   PLT 144 (L) 09/15/2019   LYMPHOPCT 15 09/10/2019   MONOPCT 14 09/10/2019   EOSPCT 3 09/10/2019   BASOPCT 1 09/10/2019    CMP:  Lab Results  Component Value Date  NA 135 09/15/2019   K 4.2 09/15/2019   CL 98 09/15/2019   CO2 26 09/15/2019   BUN 12 09/15/2019   CREATININE 0.78 09/15/2019   PROT 7.7 09/10/2019   ALBUMIN 3.7 09/10/2019   BILITOT 0.7 09/10/2019   ALKPHOS 65 09/10/2019   AST 17 09/10/2019   ALT 10 09/10/2019  .   Total Time in preparing paper work, data evaluation and todays exam - 35 minutes  Katha Hamming M.D on 09/15/2019 at 9:56 AM    Note: This dictation was prepared with Dragon dictation along with smaller phrase technology. Any transcriptional errors that result from this process are unintentional.

## 2019-09-15 NOTE — Progress Notes (Signed)
EMS contacted for transport.  Danielle Roby Donaway, BSN 

## 2019-09-24 ENCOUNTER — Emergency Department (HOSPITAL_COMMUNITY)
Admission: EM | Admit: 2019-09-24 | Discharge: 2019-09-28 | Disposition: A | Discharge status: Home / Self Care | Payer: Medicare Other | Attending: Emergency Medicine | Admitting: Emergency Medicine

## 2019-09-24 ENCOUNTER — Other Ambulatory Visit: Payer: Self-pay

## 2019-09-24 DIAGNOSIS — Z20828 Contact with and (suspected) exposure to other viral communicable diseases: Secondary | ICD-10-CM | POA: Diagnosis not present

## 2019-09-24 DIAGNOSIS — R4689 Other symptoms and signs involving appearance and behavior: Secondary | ICD-10-CM

## 2019-09-24 DIAGNOSIS — Z046 Encounter for general psychiatric examination, requested by authority: Secondary | ICD-10-CM | POA: Diagnosis present

## 2019-09-24 DIAGNOSIS — F203 Undifferentiated schizophrenia: Secondary | ICD-10-CM | POA: Diagnosis not present

## 2019-09-24 DIAGNOSIS — Z79899 Other long term (current) drug therapy: Secondary | ICD-10-CM | POA: Diagnosis not present

## 2019-09-24 DIAGNOSIS — R45851 Suicidal ideations: Secondary | ICD-10-CM

## 2019-09-24 DIAGNOSIS — I10 Essential (primary) hypertension: Secondary | ICD-10-CM | POA: Diagnosis not present

## 2019-09-24 DIAGNOSIS — R440 Auditory hallucinations: Secondary | ICD-10-CM

## 2019-09-24 DIAGNOSIS — R451 Restlessness and agitation: Secondary | ICD-10-CM | POA: Diagnosis not present

## 2019-09-24 MED ORDER — PROPRANOLOL HCL 20 MG PO TABS
20.0000 mg | ORAL_TABLET | Freq: Every day | ORAL | Status: DC
  Administered 2019-09-25 – 2019-09-28 (×4): 20 mg via ORAL
  Filled 2019-09-24 (×4): qty 1

## 2019-09-24 MED ORDER — LORAZEPAM 1 MG PO TABS
1.0000 mg | ORAL_TABLET | Freq: Two times a day (BID) | ORAL | Status: DC | PRN
  Administered 2019-09-25 – 2019-09-28 (×4): 1 mg via ORAL
  Filled 2019-09-24 (×4): qty 1

## 2019-09-24 MED ORDER — VITAMIN C 500 MG PO TABS
500.0000 mg | ORAL_TABLET | Freq: Every day | ORAL | Status: DC
  Administered 2019-09-25 – 2019-09-28 (×4): 500 mg via ORAL
  Filled 2019-09-24 (×4): qty 1

## 2019-09-24 MED ORDER — BENZTROPINE MESYLATE 0.5 MG PO TABS
0.5000 mg | ORAL_TABLET | ORAL | Status: DC
  Administered 2019-09-25 – 2019-09-28 (×4): 0.5 mg via ORAL
  Filled 2019-09-24 (×4): qty 1

## 2019-09-24 MED ORDER — VITAMIN D 25 MCG (1000 UNIT) PO TABS
1000.0000 [IU] | ORAL_TABLET | Freq: Every day | ORAL | Status: DC
  Administered 2019-09-26 – 2019-09-28 (×3): 1000 [IU] via ORAL
  Filled 2019-09-24 (×4): qty 1

## 2019-09-24 MED ORDER — ACETAMINOPHEN 325 MG PO TABS
650.0000 mg | ORAL_TABLET | Freq: Two times a day (BID) | ORAL | Status: DC
  Administered 2019-09-25 – 2019-09-28 (×8): 650 mg via ORAL
  Filled 2019-09-24 (×8): qty 2

## 2019-09-24 MED ORDER — BENZTROPINE MESYLATE 1 MG PO TABS
1.0000 mg | ORAL_TABLET | Freq: Two times a day (BID) | ORAL | Status: DC
  Administered 2019-09-25 – 2019-09-28 (×8): 1 mg via ORAL
  Filled 2019-09-24 (×8): qty 1

## 2019-09-24 MED ORDER — SERTRALINE HCL 50 MG PO TABS
100.0000 mg | ORAL_TABLET | Freq: Two times a day (BID) | ORAL | Status: DC
  Administered 2019-09-25 – 2019-09-28 (×8): 100 mg via ORAL
  Filled 2019-09-24 (×8): qty 2
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
## 2019-09-24 NOTE — ED Provider Notes (Signed)
TIME SEEN: 11:32 PM  CHIEF COMPLAINT: Psychiatric evaluation  HPI: Patient is a 58 year old male with history of hypertension and schizophrenia who presents to the emergency department from [facility] for psychiatric evaluation.  Patient was reportedly verbally abusive with staff and threatening to hurt staff" swinging at the staff" today.  He denies any thoughts of wanting to harm anyone else but does state that he is suicidal today.  Told nursing staff that he would stab himself.  States he is hearing voices but they are not telling him to hurt himself.  He states the voices are telling him that he has COVID-19.  He denies fevers, chills, cough, chest pain, shortness of breath, vomiting, diarrhea.  ROS: See HPI Constitutional: no fever  Eyes: no drainage  ENT: no runny nose   Cardiovascular:  no chest pain  Resp: no SOB  GI: no vomiting GU: no dysuria Integumentary: no rash  Allergy: no hives  Musculoskeletal: no leg swelling  Neurological: no slurred speech ROS otherwise negative  PAST MEDICAL HISTORY/PAST SURGICAL HISTORY:  Past Medical History:  Diagnosis Date  . Hypertension     MEDICATIONS:  Prior to Admission medications   Medication Sig Start Date End Date Taking? Authorizing Provider  acetaminophen (TYLENOL) 325 MG tablet Take 650 mg by mouth 2 (two) times daily.   Yes [provider]  ascorbic acid (VITAMIN C) 500 MG tablet Take 500 mg by mouth daily.   Yes [provider]  INVEGA SUSTENNA 234 MG/1.5ML SUSY injection Inject 234 mg into the muscle every 30 (thirty) days. 04/10/19  Yes [provider]  LORazepam (ATIVAN) 1 MG tablet Take 1 tablet (1 mg total) by mouth 2 (two) times daily. Patient taking differently: Take 1 mg by mouth 2 (two) times daily as needed for anxiety or sleep.  09/15/19  Yes Katha Hamming, MD  sertraline (ZOLOFT) 100 MG tablet Take 100 mg by mouth 2 (two) times a day. 01/30/18 09/24/19 Yes [provider]   benztropine (COGENTIN) 0.5 MG tablet Take 0.5 mg by mouth every morning.     [provider]  benztropine (COGENTIN) 1 MG tablet Take 1 mg by mouth 2 (two) times daily.  03/25/19   [provider]  cholecalciferol (VITAMIN D3) 25 MCG (1000 UT) tablet Take 1,000 Units by mouth daily.    [provider]  divalproex (DEPAKOTE) 500 MG DR tablet Take 1,000 mg by mouth 2 (two) times a day. 02/13/17 09/10/19  [provider]  polyethylene glycol (MIRALAX / GLYCOLAX) 17 g packet Take 17 g by mouth daily as needed for mild constipation. Patient not taking: Reported on 09/10/2019 06/08/19   MayoAllyn Kenner, MD  propranolol (INDERAL) 20 MG tablet Take 20 mg by mouth daily.  08/12/18   [provider]    ALLERGIES:  Allergies  Allergen Reactions  . Haloperidol Lactate Other (See Comments)    Per [facility]; nature of intolerance unclear intolerance Other reaction(s): Other (See Comments) Per [facility]; nature of intolerance unclear     SOCIAL HISTORY:  Social History   Tobacco Use  . Smoking status: Never Smoker  . Smokeless tobacco: Never Used  Substance Use Topics  . Alcohol use: Not Currently    FAMILY HISTORY: No family history on file.  EXAM: BP 135/75   Pulse 76   Temp 97.6 F (36.4 C) (Oral)   Resp 18   SpO2 98%  CONSTITUTIONAL: Alert and oriented and responds appropriately to questions.  Chronically ill-appearing, in no  distress HEAD: Normocephalic EYES: Conjunctivae clear, pupils appear equal, EOMI ENT: normal nose; moist mucous membranes NECK: Supple, no meningismus, no nuchal rigidity, no LAD  CARD: RRR; S1 and S2 appreciated; no murmurs, no clicks, no rubs, no gallops RESP: Normal chest excursion without splinting or tachypnea; breath sounds clear and equal bilaterally; no wheezes, no rhonchi, no rales, no hypoxia or respiratory distress, speaking full sentences ABD/GI: Normal bowel sounds; non-distended; soft, non-tender, no rebound, no  guarding, no peritoneal signs, no hepatosplenomegaly BACK:  The back appears normal and is non-tender to palpation, there is no CVA tenderness EXT: Normal ROM in all joints; non-tender to palpation; no edema; normal capillary refill; no cyanosis, no calf tenderness or swelling    SKIN: Normal color for age and race; warm; no rash NEURO: Moves all extremities equally PSYCH: Patient is suicidal with plan.  No HI currently.  Reports auditory hallucinations.  No command hallucinations.  Bizarre affect.  MEDICAL DECISION MAKING: Patient here with suicidal thoughts.  Family requesting psychiatric evaluation as this patient.  Will obtain screening labs, urine and COVID swab.  He has no infectious symptoms currently but states he is hearing voices telling him that he has COVID-19 which is very distressing to him.  Reportedly aggressive with nursing home staff tonight.  Patient is here voluntarily.    ED PROGRESS: 1:45 AM  Spoke with Beverely Low with TTS.  They recommend inpatient psychiatric treatment and I agree.  They will seek placement.  Patient is medically cleared.  I reviewed all nursing notes, vitals, pertinent previous records, EKGs, lab and urine results, imaging (as available).  Ethan Alvarez was evaluated in Emergency Department on 09/24/2019 for the symptoms described in the history of present illness. He was evaluated in the context of the global COVID-19 pandemic, which necessitated consideration that the patient might be at risk for infection with the SARS-CoV-2 virus that causes COVID-19. Institutional protocols and algorithms that pertain to the evaluation of patients at risk for COVID-19 are in a state of rapid change based on information released by regulatory bodies including the CDC and federal and state organizations. These policies and algorithms were followed during the patient's care in the ED.    Ching Rabideau, Delice Bison, DO 09/25/19 [phone]

## 2019-09-24 NOTE — ED Notes (Addendum)
Provider at bedside

## 2019-09-25 DIAGNOSIS — R451 Restlessness and agitation: Secondary | ICD-10-CM

## 2019-09-25 DIAGNOSIS — R4689 Other symptoms and signs involving appearance and behavior: Secondary | ICD-10-CM | POA: Diagnosis present

## 2019-09-25 DIAGNOSIS — R45851 Suicidal ideations: Secondary | ICD-10-CM

## 2019-09-25 LAB — CBC WITH DIFFERENTIAL/PLATELET
Abs Immature Granulocytes: 0.07 10*3/uL (ref 0.00–0.07)
Basophils Absolute: 0 10*3/uL (ref 0.0–0.1)
Basophils Relative: 1 %
Eosinophils Absolute: 0.1 10*3/uL (ref 0.0–0.5)
Eosinophils Relative: 3 %
HCT: 37.3 % — ABNORMAL LOW (ref 39.0–52.0)
Hemoglobin: 12.5 g/dL — ABNORMAL LOW (ref 13.0–17.0)
Immature Granulocytes: 1 %
Lymphocytes Relative: 23 %
Lymphs Abs: 1.2 10*3/uL (ref 0.7–4.0)
MCH: 32.8 pg (ref 26.0–34.0)
MCHC: 33.5 g/dL (ref 30.0–36.0)
MCV: 97.9 fL (ref 80.0–100.0)
Monocytes Absolute: 0.5 10*3/uL (ref 0.1–1.0)
Monocytes Relative: 10 %
Neutro Abs: 3.4 10*3/uL (ref 1.7–7.7)
Neutrophils Relative %: 62 %
Platelets: 140 10*3/uL — ABNORMAL LOW (ref 150–400)
RBC: 3.81 MIL/uL — ABNORMAL LOW (ref 4.22–5.81)
RDW: 13.1 % (ref 11.5–15.5)
WBC: 5.4 10*3/uL (ref 4.0–10.5)
nRBC: 0 % (ref 0.0–0.2)

## 2019-09-25 LAB — VALPROIC ACID LEVEL: Valproic Acid Lvl: 90 ug/mL (ref 50.0–100.0)

## 2019-09-25 LAB — COMPREHENSIVE METABOLIC PANEL
ALT: 8 U/L (ref 0–44)
AST: 13 U/L — ABNORMAL LOW (ref 15–41)
Albumin: 3.5 g/dL (ref 3.5–5.0)
Alkaline Phosphatase: 59 U/L (ref 38–126)
Anion gap: 11 (ref 5–15)
BUN: 11 mg/dL (ref 6–20)
CO2: 26 mmol/L (ref 22–32)
Calcium: 8.7 mg/dL — ABNORMAL LOW (ref 8.9–10.3)
Chloride: 102 mmol/L (ref 98–111)
Creatinine, Ser: 0.74 mg/dL (ref 0.61–1.24)
GFR calc Af Amer: >60 mL/min (ref >60)
GFR calc non Af Amer: >60 mL/min (ref >60)
Glucose, Bld: 96 mg/dL (ref 70–99)
Potassium: 4.2 mmol/L (ref 3.5–5.1)
Sodium: 139 mmol/L (ref 135–145)
Total Bilirubin: 0.4 mg/dL (ref 0.3–1.2)
Total Protein: 6.4 g/dL — ABNORMAL LOW (ref 6.5–8.1)

## 2019-09-25 LAB — SARS CORONAVIRUS 2 BY RT PCR (HOSPITAL ORDER, PERFORMED IN ~~LOC~~ HOSPITAL LAB): SARS Coronavirus 2: NEGATIVE

## 2019-09-25 LAB — ETHANOL: Alcohol, Ethyl (B): <10 mg/dL (ref <10)

## 2019-09-25 MED ORDER — DIVALPROEX SODIUM 500 MG PO DR TAB
1000.0000 mg | DELAYED_RELEASE_TABLET | Freq: Two times a day (BID) | ORAL | Status: DC
  Administered 2019-09-25 – 2019-09-28 (×8): 1000 mg via ORAL
  Filled 2019-09-25 (×8): qty 2

## 2019-09-25 NOTE — BH Assessment (Addendum)
[place] and spoke to [place]. Informed her that patient is not psych cleared for today. However, counselor made her aware that he will be reassessed tomorrow and this decision can be changed. She was ok with this decision and asked that we call with updates # [phone]. Solmon Ice will also be available on the weekend if needed.

## 2019-09-25 NOTE — BH Assessment (Signed)
Tele Assessment Note   Patient Name: Ethan Alvarez MRN: [id number] Referring Physician: Dr. Cyril Mourning Ward Location of Patient: [facility] Location of Provider: [place] is an 58 y.o. male.  -Clinician reviewed note by Dr. Leonides Schanz.  Patient is a 58 year old male with history of hypertension and schizophrenia who presents to the emergency department from [facility] for psychiatric evaluation.  Patient was reportedly verbally abusive with staff and threatening to hurt staff" swinging at the staff" today.  He denies any thoughts of wanting to harm anyone else but does state that he is suicidal today.  Told nursing staff that he would stab himself.  States he is hearing voices but they are not telling him to hurt himself.  He states the voices are telling him that he has COVID-19.  He denies fevers, chills, cough, chest pain, shortness of breath, vomiting, diarrhea.  Patient is calm and cooperative during interview.  He says that he got to thinking that staff at [facility] were the devil and demons.  Patient says that he knows now that this is false.  He says that he gets to "not thinking straight."  Patient says that he did not swing at the staff today.    Patient does talk about hearing voices telling him to kill himself.  He says that he did think about stabbing himself but says he would not do so.  Patient says that he has had previous attempts to harm himself.  Patient later says he has been thinking of getting "a gun to blow my brains out."  Pt has no access to guns however.  Patient denies any HI.  He denies visual hallucinations but does hear voices.  Patient denies use of ETOH or illicit drugs.  Patient says that he has been depressed.  He actually lives with mother he says.  He does say that his brother and sister are his guardians.  Patient says that he has been at [facility] since having surgery on his right leg in June.  Patient using a walker to get  around.  Patient says that he has a Teacher, music in [place].  He says he has a Marine scientist and a therapist that visit him which indicates he may have a ACTT team.  Patient says he has been to [facility] before in the past.  -Clinician discussed patient care with Anette Riedel, NP.  She recommends inpatient psychiatric care.  Clinician informed Dr. Leonides Schanz of disposition.  TTS to seek placement.  Diagnosis: Schizophrenia  Past Medical History:  Past Medical History:  Diagnosis Date  . Hypertension     Past Surgical History:  Procedure Laterality Date  . INCISION AND DRAINAGE Right 06/02/2019   Procedure: INCISION AND DRAINAGE;  Surgeon: Corky Mull, MD;  Location: [facility] ORS;  Service: Orthopedics;  Laterality: Right;  . QUADRICEPS TENDON REPAIR Right 04/25/2019   Procedure: REPAIR QUADRICEP TENDON;  Surgeon: Corky Mull, MD;  Location: [facility] ORS;  Service: Orthopedics;  Laterality: Right;  . QUADRICEPS TENDON REPAIR Right 06/02/2019   Procedure: REPAIR QUADRICEP TENDON;  Surgeon: Corky Mull, MD;  Location: [facility] ORS;  Service: Orthopedics;  Laterality: Right;    Family History: No family history on file.  Social History:  reports that he has never smoked. He has never used smokeless tobacco. He reports previous alcohol use. He reports previous drug use.  Additional Social History:  Alcohol / Drug Use Pain Medications: See PTA medication list Prescriptions: See PTA  medication list Over the Counter: See PTA medication list History of alcohol / drug use?: No history of alcohol / drug abuse  CIWA: CIWA-Ar BP: 135/75 Pulse Rate: 76 COWS:    Allergies:  Allergies  Allergen Reactions  . Haloperidol Lactate Other (See Comments)    Per [facility]; nature of intolerance unclear intolerance Other reaction(s): Other (See Comments) Per [facility]; nature of intolerance unclear     Home Medications: (Not in a hospital admission)   OB/GYN Status:  No LMP for male patient.  General Assessment  Data Location of Assessment: [facility] ED TTS Assessment: In system Is this a Tele or Face-to-Face Assessment?: Tele Assessment Is this an Initial Assessment or a Re-assessment for this encounter?: Initial Assessment Patient Accompanied by:: N/A Language Other than English: No Living Arrangements: In Assisted Living/Nursing Home (Comment: Name of Nursing Home([facility]) What gender do you identify as?: Male Marital status: Single Pregnancy Status: No Living Arrangements: Parent(Lives with mother) Can pt return to current living arrangement?: Yes Admission Status: Voluntary Is patient capable of signing voluntary admission?: Yes Referral Source: Self/Family/Friend(Staff at [facility]) Insurance type: [facility]     Crisis Care Plan Living Arrangements: Parent(Lives with mother) Legal Guardian: Other relative(Brother and sisters) Name of Psychiatrist: Psychiatrist in [place] (Dr. Judithe Modest) Name of Therapist: ACTT services  Education Status Is patient currently in school?: No Is the patient employed, unemployed or receiving disability?: Receiving disability income  Risk to self with the past 6 months Suicidal Ideation: No-Not Currently/Within Last 6 Months Has patient been a risk to self within the past 6 months prior to admission? : Yes Suicidal Intent: No-Not Currently/Within Last 6 Months Has patient had any suicidal intent within the past 6 months prior to admission? : No Is patient at risk for suicide?: No Suicidal Plan?: No(Pt had earlier mentioned stabbing himself.) Has patient had any suicidal plan within the past 6 months prior to admission? : No Access to Means: No What has been your use of drugs/alcohol within the last 12 months?: Denies Previous Attempts/Gestures: Yes How many times?: (Several times) Other Self Harm Risks: None Triggers for Past Attempts: Unpredictable Intentional Self Injurious Behavior: None Family Suicide History: No Recent stressful  life event(s): Conflict (Comment)(Hitting at staff at [facility] place) Persecutory voices/beliefs?: Yes Depression: Yes Depression Symptoms: Despondent, Loss of interest in usual pleasures, Feeling worthless/self pity Substance abuse history and/or treatment for substance abuse?: No Suicide prevention information given to non-admitted patients: Not applicable  Risk to Others within the past 6 months Homicidal Ideation: No Does patient have any lifetime risk of violence toward others beyond the six months prior to admission? : No Thoughts of Harm to Others: No Current Homicidal Intent: No Current Homicidal Plan: No Access to Homicidal Means: No Identified Victim: No one History of harm to others?: Yes Assessment of Violence: On admission Violent Behavior Description: Was trying to hit staff tonight Does patient have access to weapons?: No Criminal Charges Pending?: No Does patient have a court date: No Is patient on probation?: No  Psychosis Hallucinations: Auditory(Hears demons sometimes) Delusions: None noted  Mental Status Report Appearance/Hygiene: Disheveled, In scrubs Eye Contact: Fair Motor Activity: Freedom of movement, Unremarkable Speech: Logical/coherent Level of Consciousness: Alert Mood: Depressed, Sad, Anxious Affect: Depressed Anxiety Level: Moderate Thought Processes: Coherent, Relevant Judgement: Unimpaired Orientation: Person, Situation Obsessive Compulsive Thoughts/Behaviors: None  Cognitive Functioning Concentration: Decreased Memory: Recent Impaired, Remote Intact Is patient IDD: No Insight: Good Impulse Control: Fair Appetite: Good Have you had any  weight changes? : No Change Sleep: No Change Total Hours of Sleep: 8 Vegetative Symptoms: Staying in bed  ADLScreening [facility] Assessment Services) Patient's cognitive ability adequate to safely complete daily activities?: Yes Patient able to express need for assistance with ADLs?: Yes  Prior  Inpatient Therapy Prior Inpatient Therapy: Yes Prior Therapy Dates: Pt unsure Prior Therapy Facilty/Provider(s): [facility] Reason for Treatment: depression  Prior Outpatient Therapy Prior Outpatient Therapy: Yes Prior Therapy Dates: Pt unsure Prior Therapy Facilty/Provider(s): ACTT team (unsure of who Reason for Treatment: ACTT services? Does patient have an ACCT team?: Yes Does patient have Intensive In-House Services?  : Yes Does patient have [facility] services? : No Does patient have P4CC services?: No  ADL Screening (condition at time of admission) Patient's cognitive ability adequate to safely complete daily activities?: Yes Is the patient deaf or have difficulty hearing?: No Does the patient have difficulty seeing, even when wearing glasses/contacts?: Yes(Nearsighted) Does the patient have difficulty concentrating, remembering, or making decisions?: Yes Patient able to express need for assistance with ADLs?: Yes Does the patient have difficulty dressing or bathing?: No Communication: Independent Grooming: Independent Feeding: Independent Bathing: Independent Toileting: Independent In/Out Bed: Independent Walks in Home: Dependent(Using a walker.) Is this a change from baseline?: Pre-admission baseline Does the patient have difficulty walking or climbing stairs?: Yes Weakness of Legs: Right Weakness of Arms/Hands: None       Abuse/Neglect Assessment (Assessment to be complete while patient is alone) Abuse/Neglect Assessment Can Be Completed: Yes Physical Abuse: Denies Verbal Abuse: Denies Sexual Abuse: Denies Exploitation of patient/patient's resources: Denies Self-Neglect: Denies     Merchant navy officerAdvance Directives (For Healthcare) Does Patient Have a Medical Advance Directive?: No Would patient like information on creating a medical advance directive?: No - Patient declined          Disposition:  Disposition Initial Assessment Completed for this Encounter: Yes Patient  referred to: Other (Comment)(Pt to be referred out)  This service was provided via telemedicine using a 2-way, interactive audio and Immunologistvideo technology.  Names of all persons participating in this telemedicine service and their role in this encounter. Name: Ethan Alvarez Role: patient  Name: Beatriz StallionMarcus Eavan Alvarez, M.S. LCAS QP Role: clinician  Name:  Role:   Name:  Role:     Alexandria LodgeHarvey, Joli Koob Ray 09/25/2019 2:02 AM

## 2019-09-25 NOTE — Progress Notes (Signed)
Pt meets inpatient criteria per Jinny Blossom, NP. Referral information has been sent to the following hospitals for review:  [facility]  [place]       Disposition will continue to assist with inpatient placement needs.   Audree Camel, LCSW, [place] Disposition [facility] [phone]

## 2019-09-25 NOTE — ED Notes (Signed)
Pt soiled himself. Pts brief was changed and condom cath applied. Pt resting at this time.

## 2019-09-25 NOTE — ED Notes (Signed)
Pt provided sandwich and ginger ale.

## 2019-09-25 NOTE — Consult Note (Signed)
Telepsych Consultation   Reason for Consult:  Aggressive behavior and suicidal ideation Referring Physician:  EDP Location of Patient:  Location of Provider: [facility]  Patient Identification: Ethan Alvarez MRN:  [id number] Principal Diagnosis: Aggressive behavior of adult Diagnosis:  Principal Problem:   Aggressive behavior of adult Active Problems:   Suicidal ideation   Total Time spent with patient: 30 minutes  Subjective:   Ethan Alvarez is a 58 y.o. male patient admitted with aggressive behavior toward the staff at [facility] and voicing suicidal ideation.  HPI:  Pt was seen and chart reviewed with treatment team and Dr Evelene Croon. Pt resides at [facility]. He was sent to the [facility] for hitting at staff memebers and threatening them. He also was voicing suicidal thoughts. He appears confused as to why he is at the hospital. He is oriented to self. He could not tell the date or exactly where he is. He stated he lives with his mother but when asked if he lives at [facility] he said "yes." He knew the season and the president. He has been calm during his stay at the ED but did ned Ativan earlier (see nurse's notes) due to being upset, talking to himself and hallucinating. It is unclear why he is at [facility]. The patient stated he went to [facility] after he fell and injured his knee.  According to chart review, his brother [name] is his legal guardian. TTS counselor attempted to contact family to investigate further but there was no answer. Pt will be recommended for an inpatient hospitalization at this time,  pending more information from family and/or possible return to [facility]. Social work also has a call to [employer] to inquire about Pt's possible return, awaiting return call with that disposition.   Past Psychiatric History: As above  Risk to Self: Suicidal Ideation: No-Not Currently/Within Last 6 Months Suicidal Intent: No-Not Currently/Within Last 6  Months Is patient at risk for suicide?: No Suicidal Plan?: No(Pt had earlier mentioned stabbing himself.) Access to Means: No What has been your use of drugs/alcohol within the last 12 months?: Denies How many times?: (Several times) Other Self Harm Risks: None Triggers for Past Attempts: Unpredictable Intentional Self Injurious Behavior: None Risk to Others: Homicidal Ideation: No Thoughts of Harm to Others: No Current Homicidal Intent: No Current Homicidal Plan: No Access to Homicidal Means: No Identified Victim: No one History of harm to others?: Yes Assessment of Violence: On admission Violent Behavior Description: Was trying to hit staff tonight Does patient have access to weapons?: No Criminal Charges Pending?: No Does patient have a court date: No Prior Inpatient Therapy: Prior Inpatient Therapy: Yes Prior Therapy Dates: Pt unsure Prior Therapy Facilty/Provider(s): [facility] Reason for Treatment: depression Prior Outpatient Therapy: Prior Outpatient Therapy: Yes Prior Therapy Dates: Pt unsure Prior Therapy Facilty/Provider(s): ACTT team (unsure of who Reason for Treatment: ACTT services? Does patient have an ACCT team?: Yes Does patient have Intensive In-House Services?  : Yes Does patient have [facility] services? : No Does patient have P4CC services?: No  Past Medical History:  Past Medical History:  Diagnosis Date  . Hypertension     Past Surgical History:  Procedure Laterality Date  . INCISION AND DRAINAGE Right 06/02/2019   Procedure: INCISION AND DRAINAGE;  Surgeon: Christena Flake, MD;  Location: [facility] ORS;  Service: Orthopedics;  Laterality: Right;  . QUADRICEPS TENDON REPAIR Right 04/25/2019   Procedure: REPAIR QUADRICEP TENDON;  Surgeon: Christena Flake, MD;  Location: [facility] ORS;  Service: Orthopedics;  Laterality: Right;  . QUADRICEPS TENDON REPAIR Right 06/02/2019   Procedure: REPAIR QUADRICEP TENDON;  Surgeon: Christena FlakePoggi, John J, MD;  Location: [facility] ORS;  Service:  Orthopedics;  Laterality: Right;   Family History: No family history on file. Family Psychiatric  History: Pt did not provide this information Social History:  Social History   Substance and Sexual Activity  Alcohol Use Not Currently     Social History   Substance and Sexual Activity  Drug Use Not Currently    Social History   Socioeconomic History  . Marital status: Single    Spouse name: Not on file  . Number of children: Not on file  . Years of education: Not on file  . Highest education level: Not on file  Occupational History  . Not on file  Social Needs  . Financial resource strain: Not on file  . Food insecurity    Worry: Not on file    Inability: Not on file  . Transportation needs    Medical: Not on file    Non-medical: Not on file  Tobacco Use  . Smoking status: Never Smoker  . Smokeless tobacco: Never Used  Substance and Sexual Activity  . Alcohol use: Not Currently  . Drug use: Not Currently  . Sexual activity: Not Currently  Lifestyle  . Physical activity    Days per week: Not on file    Minutes per session: Not on file  . Stress: Not on file  Relationships  . Social Musicianconnections    Talks on phone: Not on file    Gets together: Not on file    Attends religious service: Not on file    Active member of club or organization: Not on file    Attends meetings of clubs or organizations: Not on file    Relationship status: Not on file  Other Topics Concern  . Not on file  Social History Narrative  . Not on file   Additional Social History:    Allergies:   Allergies  Allergen Reactions  . Haloperidol Lactate Other (See Comments)    Per [facility]; nature of intolerance unclear intolerance Other reaction(s): Other (See Comments) Per [facility]; nature of intolerance unclear     Labs:  Results for orders placed or performed during the hospital encounter of 09/24/19 (from the past 48 hour(s))  Comprehensive metabolic panel     Status: Abnormal   Collection  Time: 09/24/19 11:49 PM  Result Value Ref Range   Sodium 139 135 - 145 mmol/L   Potassium 4.2 3.5 - 5.1 mmol/L   Chloride 102 98 - 111 mmol/L   CO2 26 22 - 32 mmol/L   Glucose, Bld 96 70 - 99 mg/dL   BUN 11 6 - 20 mg/dL   Creatinine, Ser 4.090.74 0.61 - 1.24 mg/dL   Calcium 8.7 (L) 8.9 - 10.3 mg/dL   Total Protein 6.4 (L) 6.5 - 8.1 g/dL   Albumin 3.5 3.5 - 5.0 g/dL   AST 13 (L) 15 - 41 U/L   ALT 8 0 - 44 U/L   Alkaline Phosphatase 59 38 - 126 U/L   Total Bilirubin 0.4 0.3 - 1.2 mg/dL   GFR calc non Af Amer >60 >60 mL/min   GFR calc Af Amer >60 >60 mL/min   Anion gap 11 5 - 15    Comment: Performed at [facility], [address] [place], [place] [address]  Ethanol  Status: None   Collection Time: 09/24/19 11:49 PM  Result Value Ref Range   Alcohol, Ethyl (B) <10 <10 mg/dL    Comment: (NOTE) Lowest detectable limit for serum alcohol is 10 mg/dL. For medical purposes only. Performed at [facility], [address] [place], [place] [address]   CBC with Diff     Status: Abnormal   Collection Time: 09/24/19 11:49 PM  Result Value Ref Range   WBC 5.4 4.0 - 10.5 K/uL   RBC 3.81 (L) 4.22 - 5.81 MIL/uL   Hemoglobin 12.5 (L) 13.0 - 17.0 g/dL   HCT 26.9 (L) 48.5 - 46.2 %   MCV 97.9 80.0 - 100.0 fL   MCH 32.8 26.0 - 34.0 pg   MCHC 33.5 30.0 - 36.0 g/dL   RDW 70.3 50.0 - 93.8 %   Platelets 140 (L) 150 - 400 K/uL   nRBC 0.0 0.0 - 0.2 %   Neutrophils Relative % 62 %   Neutro Abs 3.4 1.7 - 7.7 K/uL   Lymphocytes Relative 23 %   Lymphs Abs 1.2 0.7 - 4.0 K/uL   Monocytes Relative 10 %   Monocytes Absolute 0.5 0.1 - 1.0 K/uL   Eosinophils Relative 3 %   Eosinophils Absolute 0.1 0.0 - 0.5 K/uL   Basophils Relative 1 %   Basophils Absolute 0.0 0.0 - 0.1 K/uL   Immature Granulocytes 1 %   Abs Immature Granulocytes 0.07 0.00 - 0.07 K/uL    Comment: Performed at [facility], [address], [place], [place] [address]  Valproic  acid level     Status: None   Collection Time: 09/24/19 11:51 PM  Result Value Ref Range   Valproic Acid Lvl 90 50.0 - 100.0 ug/mL    Comment: Performed at [facility], [address] [place], [place] [address]  SARS Coronavirus 2 by RT PCR (hospital order, performed in [facility] hospital lab) Nasopharyngeal Nasopharyngeal Swab     Status: None   Collection Time: 09/25/19  2:18 AM   Specimen: Nasopharyngeal Swab  Result Value Ref Range   SARS Coronavirus 2 NEGATIVE NEGATIVE    Comment: (NOTE) If result is NEGATIVE SARS-CoV-2 target nucleic acids are NOT DETECTED. The SARS-CoV-2 RNA is generally detectable in upper and lower  respiratory specimens during the acute phase of infection. The lowest  concentration of SARS-CoV-2 viral copies this assay can detect is 250  copies / mL. A negative result does not preclude SARS-CoV-2 infection  and should not be used as the sole basis for treatment or other  patient management decisions.  A negative result may occur with  improper specimen collection / handling, submission of specimen other  than nasopharyngeal swab, presence of viral mutation(s) within the  areas targeted by this assay, and inadequate number of viral copies  (<250 copies / mL). A negative result must be combined with clinical  observations, patient history, and epidemiological information. If result is POSITIVE SARS-CoV-2 target nucleic acids are DETECTED. The SARS-CoV-2 RNA is generally detectable in upper and lower  respiratory specimens dur ing the acute phase of infection.  Positive  results are indicative of active infection with SARS-CoV-2.  Clinical  correlation with patient history and other diagnostic information is  necessary to determine patient infection status.  Positive results do  not rule out bacterial infection or co-infection with other viruses. If result is PRESUMPTIVE POSTIVE SARS-CoV-2 nucleic acids MAY BE PRESENT.   A presumptive  positive result was  obtained on the submitted specimen  and confirmed on repeat testing.  While 2019 novel coronavirus  (SARS-CoV-2) nucleic acids may be present in the submitted sample  additional confirmatory testing may be necessary for epidemiological  and / or clinical management purposes  to differentiate between  SARS-CoV-2 and other Sarbecovirus currently known to infect humans.  If clinically indicated additional testing with an alternate test  methodology [phone]) is advised. The SARS-CoV-2 RNA is generally  detectable in upper and lower respiratory sp ecimens during the acute  phase of infection. The expected result is Negative. Fact Sheet for Patients:  StrictlyIdeas.no Fact Sheet for Healthcare Providers: BankingDealers.co.za This test is not yet approved or cleared by the [place] FDA and has been authorized for detection and/or diagnosis of SARS-CoV-2 by FDA under an Emergency Use Authorization (EUA).  This EUA will remain in effect (meaning this test can be used) for the duration of the COVID-19 declaration under Section 564(b)(1) of the Act, 21 U.S.C. section 360bbb-3(b)(1), unless the authorization is terminated or revoked sooner. Performed at [facility], [place] [address], [place], [place] [address]     Medications:  Current Facility-Administered Medications  Medication Dose Route Frequency Provider Last Rate Last Dose  . acetaminophen (TYLENOL) tablet 650 mg  650 mg Oral BID Ward, Kristen N, DO   650 mg at 09/25/19 1131  . benztropine (COGENTIN) tablet 0.5 mg  0.5 mg Oral BH-q7a Ward, Kristen N, DO   0.5 mg at 09/25/19 0725  . benztropine (COGENTIN) tablet 1 mg  1 mg Oral BID Ward, Kristen N, DO   1 mg at 09/25/19 1131  . cholecalciferol (VITAMIN D3) tablet 1,000 Units  1,000 Units Oral Daily Ward, Kristen N, DO      . divalproex (DEPAKOTE) DR tablet 1,000 mg  1,000 mg Oral Q12H Ward, Kristen  N, DO   1,000 mg at 09/25/19 1132  . LORazepam (ATIVAN) tablet 1 mg  1 mg Oral BID PRN Ward, Kristen N, DO   1 mg at 09/25/19 1132  . propranolol (INDERAL) tablet 20 mg  20 mg Oral Daily Ward, Kristen N, DO      . sertraline (ZOLOFT) tablet 100 mg  100 mg Oral BID Ward, Kristen N, DO   100 mg at 09/25/19 1132  . vitamin C (ASCORBIC ACID) tablet 500 mg  500 mg Oral Daily Ward, Kristen N, DO       Current Outpatient Medications  Medication Sig Dispense Refill  . acetaminophen (TYLENOL) 325 MG tablet Take 650 mg by mouth 2 (two) times daily.    Marland Kitchen ascorbic acid (VITAMIN C) 500 MG tablet Take 500 mg by mouth daily.    . benztropine (COGENTIN) 0.5 MG tablet Take 0.5 mg by mouth every morning.     . benztropine (COGENTIN) 1 MG tablet Take 1 mg by mouth 2 (two) times daily.     . cholecalciferol (VITAMIN D3) 25 MCG (1000 UT) tablet Take 1,000 Units by mouth daily.    . divalproex (DEPAKOTE) 500 MG DR tablet Take 1,000 mg by mouth 2 (two) times a day.    Lorayne Bender SUSTENNA 234 MG/1.5ML SUSY injection Inject 234 mg into the muscle every 30 (thirty) days.    Marland Kitchen LORazepam (ATIVAN) 1 MG tablet Take 1 tablet (1 mg total) by mouth 2 (two) times daily. (Patient taking differently: Take 1 mg by mouth 2 (two) times daily as needed for anxiety or sleep. ) 30 tablet 0  . OLANZapine (ZYPREXA) 5  MG tablet Take 5-15 mg by mouth See admin instructions. 15 Mg in AM and 5 MG in afternoon    . polyethylene glycol (MIRALAX / GLYCOLAX) 17 g packet Take 17 g by mouth daily as needed for mild constipation. 14 each 0  . propranolol (INDERAL) 20 MG tablet Take 20 mg by mouth daily.     . sertraline (ZOLOFT) 100 MG tablet Take 100 mg by mouth 2 (two) times a day.      Musculoskeletal: Strength & Muscle Tone: within normal limits Gait & Station: unable to assess Patient leans: unable to assess  Psychiatric Specialty Exam: Physical Exam  Constitutional: He appears well-developed and well-nourished.  HENT:  Head:  Normocephalic.  Neurological: He is alert.    ROS  Blood pressure 129/71, pulse (!) 57, temperature 97.6 F (36.4 C), temperature source Oral, resp. rate 18, SpO2 100 %.There is no height or weight on file to calculate BMI.  General Appearance: Casual  Eye Contact:  Good  Speech:  Clear and Coherent  Volume:  Normal  Mood:  Euthymic  Affect:  Congruent  Thought Process:  Disorganized and Descriptions of Associations: Loose  Orientation:  Other:  Person  Thought Content:  Logical  Suicidal Thoughts:  No  Homicidal Thoughts:  No  Memory:  Immediate;   Fair Recent;   Fair Remote;   Fair  Judgement:  Other:  unable to assess due to confusion  Insight:  unable to assess due to confusion  Psychomotor Activity:  Normal  Concentration:  Concentration: Good and Attention Span: Good  Recall:  [employer] of Knowledge:  Fair  Language:  Good  Akathisia:  Negative  Handed:  Right  AIMS (if indicated):     Assets:  Architect Housing  ADL's:  Impaired, per chart: Pt soiled himself and had a condom cath placed, ambulates with a walker  Cognition:  Impaired,  Mild  Sleep:        Treatment Plan Summary: Daily contact with patient to assess and evaluate symptoms and progress in treatment, Medication management and Plan Continue to attempt contact with family for collateral information  Medications: Cogentin 0.5 mg every morning for EPS Cogentin 1 mg twice daily for EPS Zoloft 100 mg daily for depression Depakote DR 1,000 mg every 12 hours for mood stabilization Ativan 1 mg twice daily PRN for anxiety   Disposition: Recommend psychiatric Inpatient admission when medically cleared.  This service was provided via telemedicine using a 2-way, interactive audio and video technology.  Names of all persons participating in this telemedicine service and their role in this encounter. Name: Jamier Urbas Role: Patient  Name: Elta Guadeloupe Role: PMHNP-BC   Name: Zena Amos, MD Role: Psychiatrist  Name:  Role:     Laveda Abbe, NP 09/25/2019 1:59 PM

## 2019-09-25 NOTE — ED Notes (Signed)
Pt sister Jeremy Ditullio called and family and desire that Pt to return back to [facility], and feel that pt had not been managed well with medications.

## 2019-09-25 NOTE — ED Notes (Signed)
Pt continues to have visual hallucination and is paranoid that staff and hospital setting is trying to kill him.

## 2019-09-25 NOTE — BH Assessment (Signed)
Call from [facility] stating pt is able to return to facility. If pt returns home after midnight tonight, a new authorization will be required, if before then it will not be necessary. Any questions please call [phone]

## 2019-09-25 NOTE — ED Notes (Addendum)
Pt is anxious and has intermittent moment of crying and stating that he is scared. Pt was redirected and provided emotional support . Pt states " I want to go back to [facility]. Pt was given ativan to manage symptoms.

## 2019-09-25 NOTE — ED Notes (Signed)
Pt is hallucinating and is talking to his mother in the room, pt states that I just saw her pass.

## 2019-09-25 NOTE — Progress Notes (Signed)
CSW left voice message with admissions staff at [facility] [phone]) requesting a return phone call to discuss pt's disposition.   Audree Camel, LCSW, [place] [facility]/TTS [phone]

## 2019-09-26 DIAGNOSIS — R4689 Other symptoms and signs involving appearance and behavior: Secondary | ICD-10-CM

## 2019-09-26 NOTE — ED Notes (Signed)
Contacted Felicia from [facility]. She stated she has to get a new insurance auth before patient can return and this can take a day. Notified charge Therapist, sports.

## 2019-09-26 NOTE — Progress Notes (Signed)
CSW spoke with Olivia Mackie at [facility] who states she started a new insurance authorization for this patient this morning. Olivia Mackie to return call to [place] if Josem Kaufmann becomes available today, however it is unlikely due to it being [employer].  CSW to continue following.  Madilyn Fireman, MSW, LCSW-A Clinical Social Worker   [facility]   Medical ICU [phone]

## 2019-09-26 NOTE — ED Notes (Signed)
Patient ambulated to restroom with walker. Formed bowel movement.

## 2019-09-26 NOTE — Consult Note (Signed)
[facility] Psych ED Discharge  09/26/2019 10:46 AM Ethan Alvarez  MRN:  [id number] Principal Problem: Aggressive behavior of adult Discharge Diagnoses: Principal Problem:   Aggressive behavior of adult Active Problems:   Suicidal ideation   Subjective: Patient is a 58 year old male that reported that yesterday he was upset at the facility he was staying at due to music being played loudly.  Patient continues to deny having any suicidal or homicidal ideations and denies any hallucinations.  Patient stated that he did make those comments yesterday because he was upset but then states "I would never hurt myself because that is not the way to get to God."  Patient does report that if he did that at least he would be with Jesus.  Patient continues to say that he just got upset yesterday and has no plan to hurt himself or to hurt anyone else and states that he feels that he is ready to return to his facility.  Patient reports that he just feels safe at the facility and that he reads the Bible and praise for coping skills.  Patient did report that he did have a previous suicide attempt but it was in 26.  Jenny Reichmann, legal guardian, was contacted and states that there are no safety concerns for this patient and he would never hurt himself or anyone else. He would like to see him go back to [facility] soon and feels that his brother is completely safe to return.  Total Time spent with patient: 30 minutes  Past Psychiatric History: None reported  Past Medical History:  Past Medical History:  Diagnosis Date  . Hypertension     Past Surgical History:  Procedure Laterality Date  . INCISION AND DRAINAGE Right 06/02/2019   Procedure: INCISION AND DRAINAGE;  Surgeon: Corky Mull, MD;  Location: [facility] ORS;  Service: Orthopedics;  Laterality: Right;  . QUADRICEPS TENDON REPAIR Right 04/25/2019   Procedure: REPAIR QUADRICEP TENDON;  Surgeon: Corky Mull, MD;  Location: [facility] ORS;  Service: Orthopedics;   Laterality: Right;  . QUADRICEPS TENDON REPAIR Right 06/02/2019   Procedure: REPAIR QUADRICEP TENDON;  Surgeon: Corky Mull, MD;  Location: [facility] ORS;  Service: Orthopedics;  Laterality: Right;   Family History: No family history on file. Family Psychiatric  History: None reported Social History:  Social History   Substance and Sexual Activity  Alcohol Use Not Currently     Social History   Substance and Sexual Activity  Drug Use Not Currently    Social History   Socioeconomic History  . Marital status: Single    Spouse name: Not on file  . Number of children: Not on file  . Years of education: Not on file  . Highest education level: Not on file  Occupational History  . Not on file  Social Needs  . Financial resource strain: Not on file  . Food insecurity    Worry: Not on file    Inability: Not on file  . Transportation needs    Medical: Not on file    Non-medical: Not on file  Tobacco Use  . Smoking status: Never Smoker  . Smokeless tobacco: Never Used  Substance and Sexual Activity  . Alcohol use: Not Currently  . Drug use: Not Currently  . Sexual activity: Not Currently  Lifestyle  . Physical activity    Days per week: Not on file    Minutes per session: Not on file  . Stress: Not on file  Relationships  .  Social Musicianconnections    Talks on phone: Not on file    Gets together: Not on file    Attends religious service: Not on file    Active member of club or organization: Not on file    Attends meetings of clubs or organizations: Not on file    Relationship status: Not on file  Other Topics Concern  . Not on file  Social History Narrative  . Not on file    Has this patient used any form of tobacco in the last 30 days? (Cigarettes, Smokeless Tobacco, Cigars, and/or Pipes) No  Current Medications: Current Facility-Administered Medications  Medication Dose Route Frequency Provider Last Rate Last Dose  . acetaminophen (TYLENOL) tablet 650 mg  650 mg Oral  BID Ward, Kristen N, DO   650 mg at 09/25/19 2129  . benztropine (COGENTIN) tablet 0.5 mg  0.5 mg Oral BH-q7a Ward, Kristen N, DO   0.5 mg at 09/25/19 0725  . benztropine (COGENTIN) tablet 1 mg  1 mg Oral BID Ward, Kristen N, DO   1 mg at 09/25/19 2129  . cholecalciferol (VITAMIN D3) tablet 1,000 Units  1,000 Units Oral Daily Ward, Kristen N, DO      . divalproex (DEPAKOTE) DR tablet 1,000 mg  1,000 mg Oral Q12H Ward, Kristen N, DO   1,000 mg at 09/25/19 2129  . LORazepam (ATIVAN) tablet 1 mg  1 mg Oral BID PRN Ward, Kristen N, DO   1 mg at 09/25/19 2152  . propranolol (INDERAL) tablet 20 mg  20 mg Oral Daily Ward, Kristen N, DO   20 mg at 09/25/19 1745  . sertraline (ZOLOFT) tablet 100 mg  100 mg Oral BID Ward, Kristen N, DO   100 mg at 09/25/19 2128  . vitamin C (ASCORBIC ACID) tablet 500 mg  500 mg Oral Daily Ward, Kristen N, DO   500 mg at 09/25/19 1745   Current Outpatient Medications  Medication Sig Dispense Refill  . acetaminophen (TYLENOL) 325 MG tablet Take 650 mg by mouth 2 (two) times daily.    Marland Kitchen. ascorbic acid (VITAMIN C) 500 MG tablet Take 500 mg by mouth daily.    . benztropine (COGENTIN) 0.5 MG tablet Take 0.5 mg by mouth every morning.     . benztropine (COGENTIN) 1 MG tablet Take 1 mg by mouth 2 (two) times daily.     . cholecalciferol (VITAMIN D3) 25 MCG (1000 UT) tablet Take 1,000 Units by mouth daily.    . divalproex (DEPAKOTE) 500 MG DR tablet Take 1,000 mg by mouth 2 (two) times a day.    Hinda Glatter. INVEGA SUSTENNA 234 MG/1.5ML SUSY injection Inject 234 mg into the muscle every 30 (thirty) days.    Marland Kitchen. LORazepam (ATIVAN) 1 MG tablet Take 1 tablet (1 mg total) by mouth 2 (two) times daily. (Patient taking differently: Take 1 mg by mouth 2 (two) times daily as needed for anxiety or sleep. ) 30 tablet 0  . OLANZapine (ZYPREXA) 5 MG tablet Take 5-15 mg by mouth See admin instructions. 15 Mg in AM and 5 MG in afternoon    . polyethylene glycol (MIRALAX / GLYCOLAX) 17 g packet Take 17 g by  mouth daily as needed for mild constipation. 14 each 0  . propranolol (INDERAL) 20 MG tablet Take 20 mg by mouth daily.     . sertraline (ZOLOFT) 100 MG tablet Take 100 mg by mouth 2 (two) times a day.     PTA Medications: (Not in a  hospital admission)   Musculoskeletal: Strength & Muscle Tone: decreased Gait & Station: Patient remained in bed during evaluation Patient leans: N/A  Psychiatric Specialty Exam: Physical Exam  Nursing note and vitals reviewed. Constitutional: He is oriented to person, place, and time. He appears well-developed and well-nourished.  Cardiovascular: Normal rate.  Respiratory: Effort normal.  Neurological: He is alert and oriented to person, place, and time.    Review of Systems  Constitutional: Negative.   HENT: Negative.   Eyes: Negative.   Respiratory: Negative.   Cardiovascular: Negative.   Gastrointestinal: Negative.   Genitourinary: Negative.   Musculoskeletal: Negative.   Skin: Negative.   Neurological: Negative.   Endo/Heme/Allergies: Negative.   Psychiatric/Behavioral: Negative.     Blood pressure 125/69, pulse 66, temperature 98.3 F (36.8 C), temperature source Oral, resp. rate 18, SpO2 93 %.There is no height or weight on file to calculate BMI.  General Appearance: Casual  Eye Contact:  Good  Speech:  Clear and Coherent and Normal Rate  Volume:  Normal  Mood:  Euthymic  Affect:  Congruent  Thought Process:  Coherent and Descriptions of Associations: Intact  Orientation:  Full (Time, Place, and Person)  Thought Content:  WDL  Suicidal Thoughts:  No  Homicidal Thoughts:  No  Memory:  Immediate;   Good Recent;   Good Remote;   Good  Judgement:  Fair  Insight:  Fair  Psychomotor Activity:  Normal  Concentration:  Concentration: Good  Recall:  Good  Fund of Knowledge:  Good  Language:  Good  Akathisia:  No  Handed:  Right  AIMS (if indicated):     Assets:  Communication Skills Desire for Improvement Financial  Resources/Insurance Housing Resilience Social Support Transportation  ADL's:  Impaired Staying at [facility] for care at this time.  Cognition:  WNL  Sleep:        Demographic Factors:  Male and Caucasian  Loss Factors: Decline in physical health  Historical Factors: Prior suicide attempts  Risk Reduction Factors:   Religious beliefs about death, Living with another person, especially a relative, Positive social support, Positive therapeutic relationship and Positive coping skills or problem solving skills  Continued Clinical Symptoms:  Medical Diagnoses and Treatments/Surgeries  Cognitive Features That Contribute To Risk:  None    Suicide Risk:  Minimal: No identifiable suicidal ideation.  Patients presenting with no risk factors but with morbid ruminations; may be classified as minimal risk based on the severity of the depressive symptoms     Plan Of Care/Follow-up recommendations:  Continue activity as tolerated. Continue diet as recommended by your PCP. Ensure to keep all appointments with outpatient providers.  Disposition: Discharge to [facility]. Patient does not meet inpatient criteria and is psychiatrically cleared.  Gerlene Burdock Charl Wellen, FNP 09/26/2019, 10:46 AM

## 2019-09-27 NOTE — Progress Notes (Signed)
CSW received VM from [facility] stating they will need to conduct a peer to peer consult with one of patient's providers on Monday 09/28/2019 between 8am-12pm at [phone] option 5.   CSW spoke with Margaretmary Bayley with [facility] to get more clarity on the new auth process now that [facility] is managed by [facility]. Margaretmary Bayley reports she spoke with patient's case manager with Everlene B[facility] Lorel Monaco [phone]) who stated her to if patient is in the ED for 2 midnights that patient will need another auth for SNF. CSW explained that because patient came to the ED for psych issues and not skilled that there may not be an ED provider who will be able complete the peer to peer. CSW to call patient's case manager to see if there is another way to get patient approved to go back to SNF. Per record, patient went to [facility] from [facility] on 9/29 and still needs to complete his treatment. CSW to follow up with [facility] tomorrow morning.   Golden Circle, LCSW Transitions of Care Department [facility] ED [phone]

## 2019-09-27 NOTE — ED Notes (Signed)
Pt is constantly repeating "I don't want to die, please don't kill me, my life is precious to me" When asked who was he talking to pt reports AV hallucinations, sts it is something he has been struggling with for a long time, and even thought he understand it is just a product of his overactive brain, and it's not real, it is regard;ess. Very frustrating to him. Pt was reassured he was in a safe place and no one will kill him here.

## 2019-09-28 NOTE — ED Notes (Signed)
Pt is resting at this time

## 2019-09-28 NOTE — ED Notes (Signed)
Received Ethan Alvarez at the change of shift awake in his bed. Later his family arrived to take him home. He received his discharge   Paperwork and his personal belongings and leg brace were returned. He was discharged via wheel chair without incident.

## 2019-09-28 NOTE — Consult Note (Signed)
Talk to Dr. Irene Shipper, patient is psychiatrically cleared and does not need inpatient psychiatric care.  The physician is Market researcher for [facility] health, contact [phone]

## 2019-09-28 NOTE — Progress Notes (Addendum)
9:00a  CSW spoke with EDP about need for peer to peer. EDP states that this is something the the EDP could do since patient came for psych evaluation and we agree that the psych provider can speak with them and state patient does not need inpatient psych treatment and his level of can can be SNF. EDP put in another consult for TTS and spoke with TTS provider. CSW also spoke with TTS counselor who will make sure the information is passed on. TTS provider to complete peer to peer consult with [facility].   8:10a CSW attempted to contact patient's case managed with Kandee Keen [phone]) about patient's authorization. CSW left VM.   CSW contacted [facility] number [phone]) and was able to speak with another case manager who reports the peer to peer is need to make sure SNF if the right level of care for patient due to him initially coming into the hospital for psych evaluation. CSW to speak with EDP to have this completed. Insurance authorization is patient's only barrier to discharge.  Golden Circle, LCSW Transitions of Care Department [facility] ED [phone]

## 2019-09-28 NOTE — Discharge Instructions (Addendum)
Follow-up with your therapist. °

## 2019-09-28 NOTE — Progress Notes (Signed)
CSW spoke with Olivia Mackie with [place] who reports patient's SNF authorization was denied. Olivia Mackie reports that the out of pocket cost for room and board at [facility] will be $8,250 and this does not include PT.   CSW spoke with patient's sister-in-law [name] and let her know the outcome. [name] reports she is currently out of town and will not be able to get patient until 10p tonight. [name] is going to try to make contact with her husband, patient's brother/legal guardian, to see if he can come get patient when he gets off of work. [name] to also let CSW if if she would like to set up [facility] services for patient again. [name] briefly spoke with CSW about getting patient additional in home care.  Golden Circle, LCSW Transitions of Care Department [facility] ED [phone]

## 2019-09-28 NOTE — Progress Notes (Signed)
[name] Rue in contact with 2nd shift CSW and reported that she was on the way to pick up pt from ED and would arrive at about 730pm. Pt is noted to be ready for discharge.

## 2019-09-28 NOTE — ED Provider Notes (Addendum)
Patient came here from [facility] because of hostile behavior. It appears that psychiatry was going to initially admit the patient but now he is stable for discharge.  There has been some barriers from insurance side.  They request a peer to peer conversation.  I informed the social worker that this is not an area of expertise for emergency physicians, especially for patients who have been bedded in the ER for 80+ hours for non-emergent issues and when patient came from SNF in first place.  We have advised them to ask psychiatry to have the peer to peer conversation because the primary issue likely is behavioral and medical.  Today's Vitals   09/27/19 1459 09/27/19 1752 09/27/19 2137 09/28/19 0651  BP:  122/71 132/79 120/71  Pulse:  67 80 75  Resp:  18 18 20   Temp:  98.6 F (37 C) 99.3 F (37.4 C) 98.6 F (37 C)  TempSrc:  Oral Oral Oral  SpO2:  95% 95% 96%  PainSc: 0-No pain      Vital signs are stable and within normal limits.   Varney Biles, MD 09/28/19 1123  2:47 PM Dr. Dwyane Dee had peer-to-peer review with the insurance company and she informed me that patient is cleared for discharge.     Varney Biles, MD 09/28/19 1447

## 2020-04-23 DIAGNOSIS — E669 Obesity, unspecified: Secondary | ICD-10-CM | POA: Insufficient documentation

## 2020-07-02 IMAGING — CR DG KNEE COMPLETE 4+V*R*
5 series · 5 of 5 positions shown · non-contrast
Comparison: None.

CLINICAL DATA: Status post fall x2 in the last 24 hours. Pain of
right knee.

EXAM:
RIGHT KNEE - COMPLETE 4+ VIEW

[knee ap]
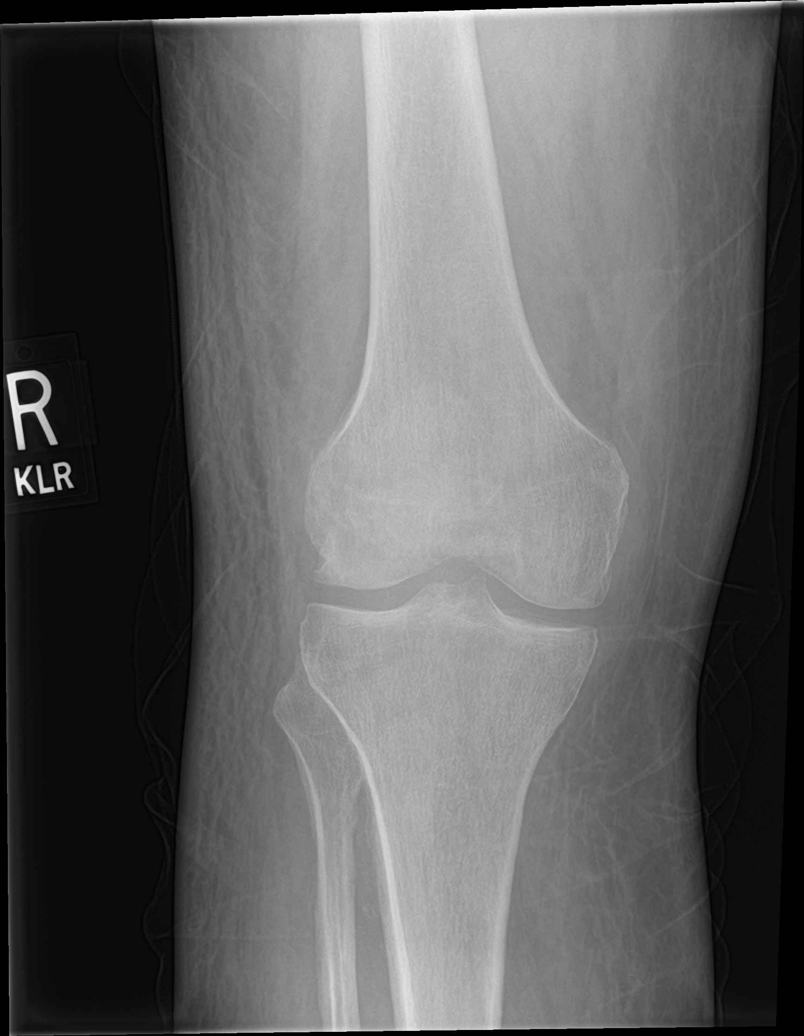

[knee obl (1 of 3)]
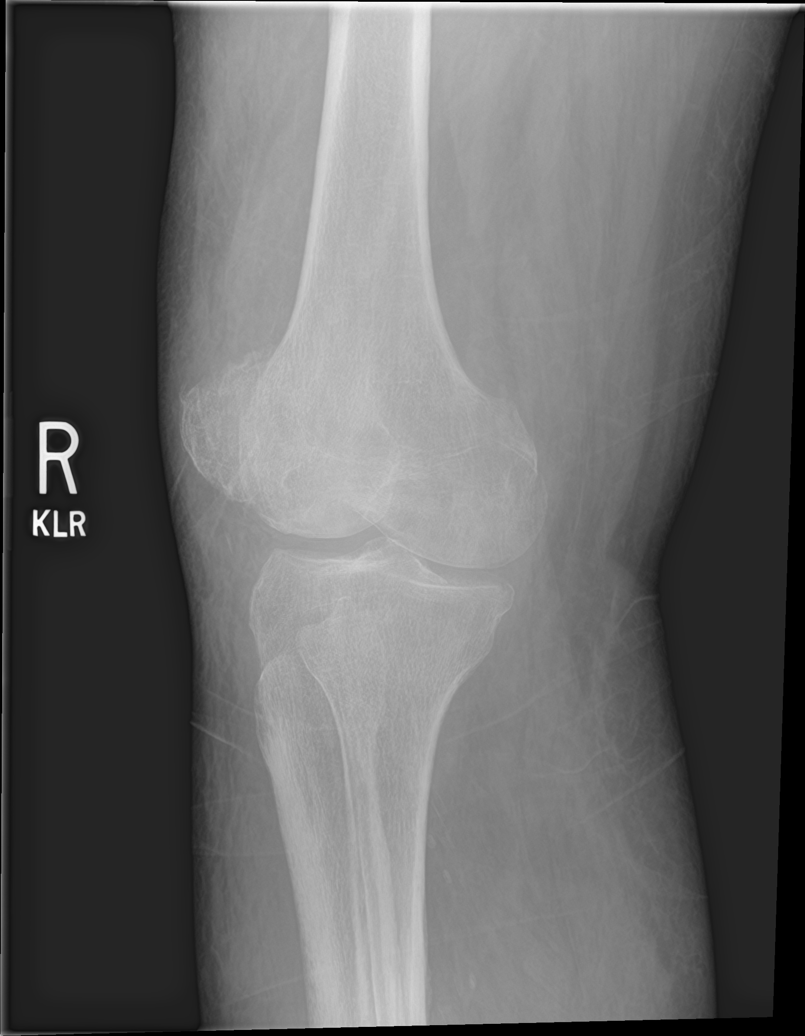

[knee obl (2 of 3)]
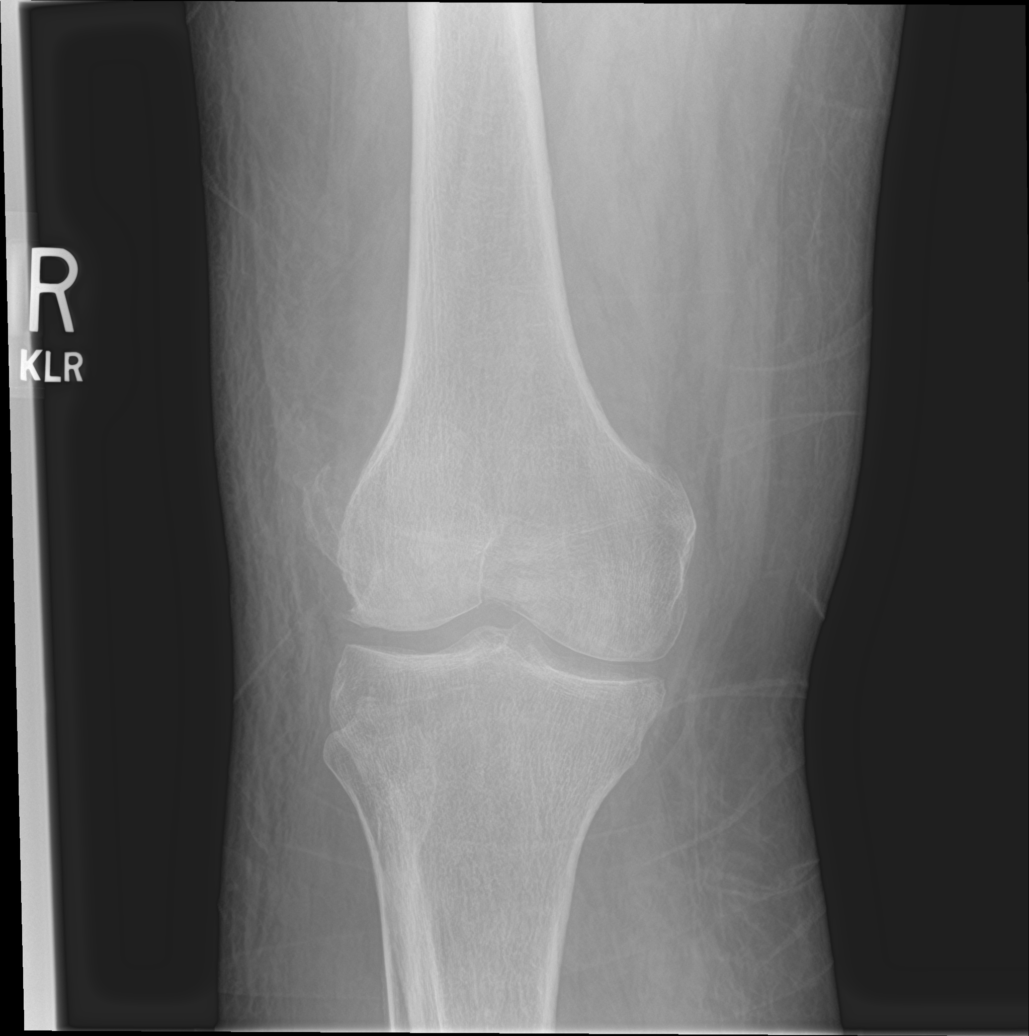

[knee lat]
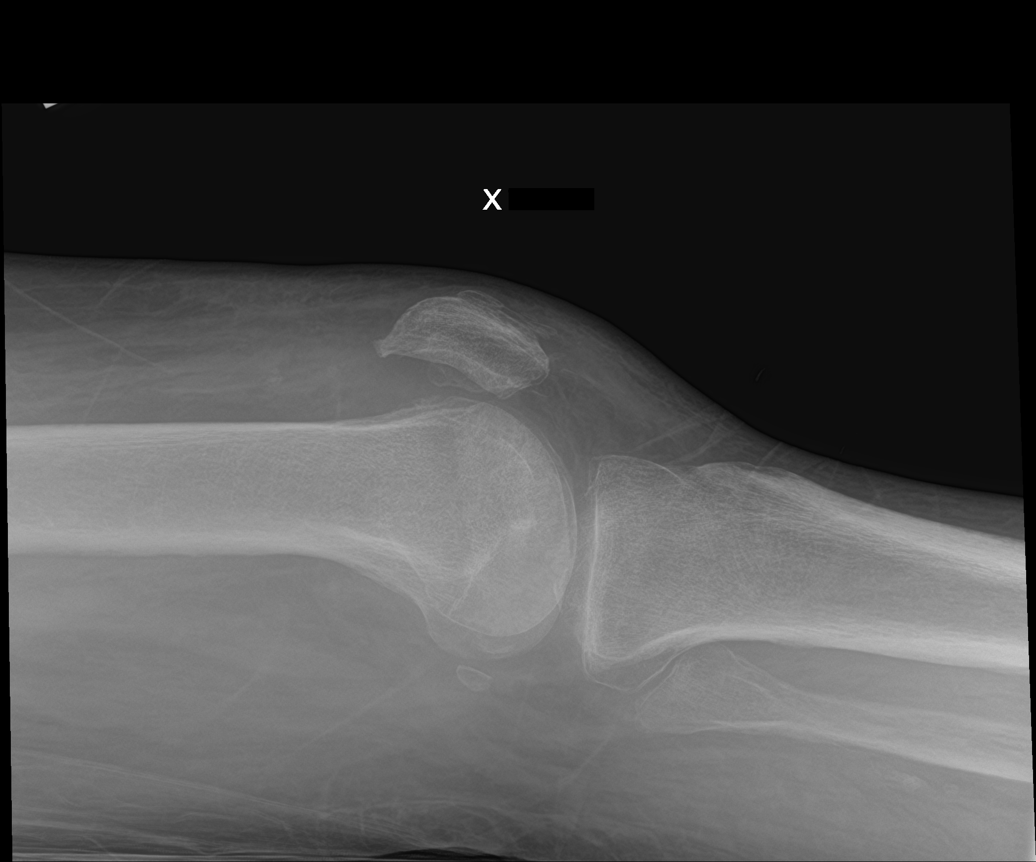

[knee obl (3 of 3)]
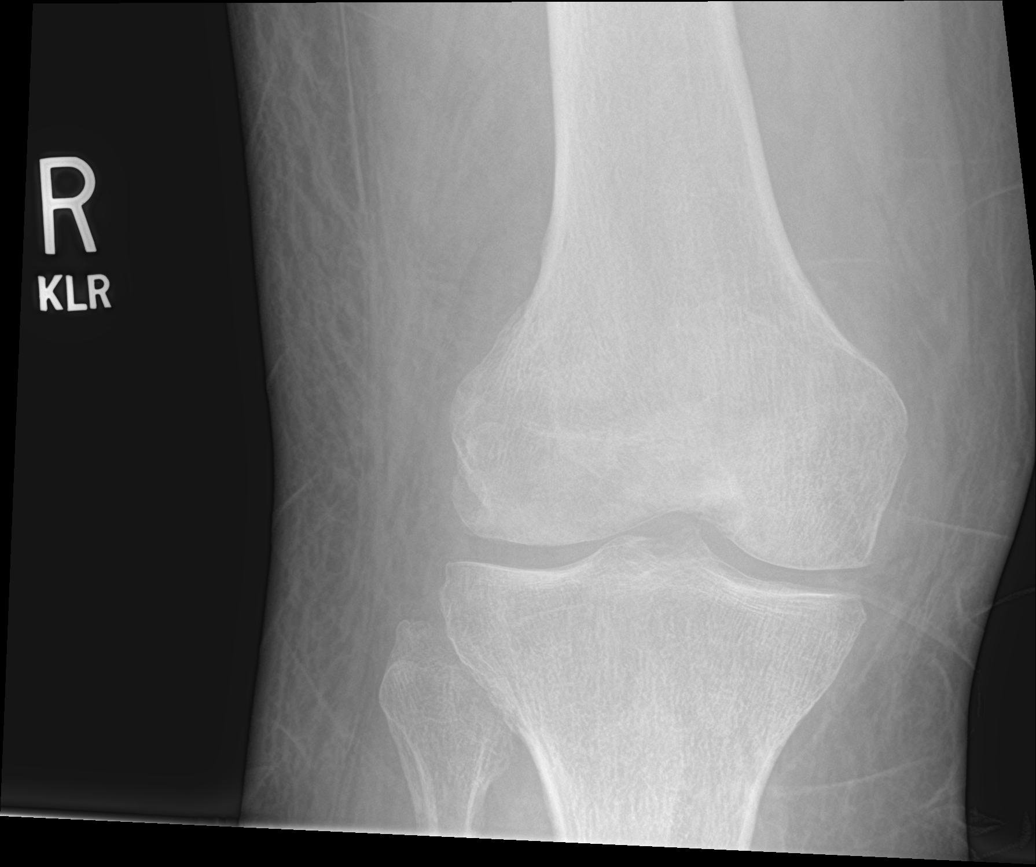

[5 of 5 positions shown; findings below may reference images not displayed]

FINDINGS: No evidence of fracture, dislocation. Small suprapatellar effusion
is noted. Mild osteophytosis and tibial spine and superior patella
are identified. Soft tissues are unremarkable.
IMPRESSION: No acute fracture or dislocation.

Mild degenerative joint changes of right knee.

## 2020-07-02 IMAGING — CT CT HEAD W/O CM
4 of 7 series · 14 of 47 positions shown, 15 images · non-contrast
Comparison: None.

CLINICAL DATA: 57-year-old male with a history of fall

EXAM:
CT HEAD WITHOUT CONTRAST
CT CERVICAL SPINE WITHOUT CONTRAST
TECHNIQUE: Multidetector CT imaging of the head and cervical spine was
performed following the standard protocol without intravenous
contrast. Multiplanar CT image reconstructions of the cervical spine
were also generated.

[Series 2: head wo · axial · 0.43mm/px · z∈[+71,+121]mm · 2 of 31 slices shown, 3 images]
[im 11/31  brain]
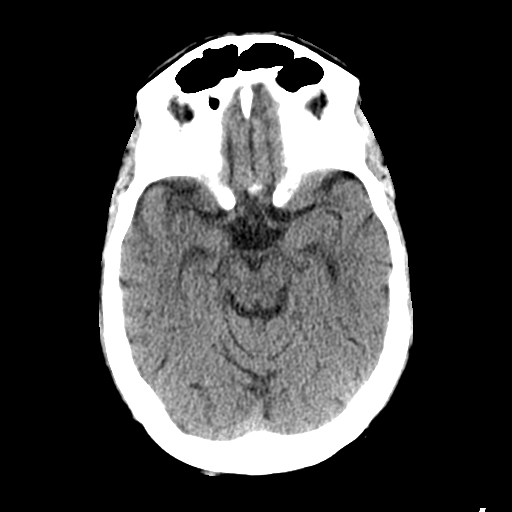
[im 11/31  bone]
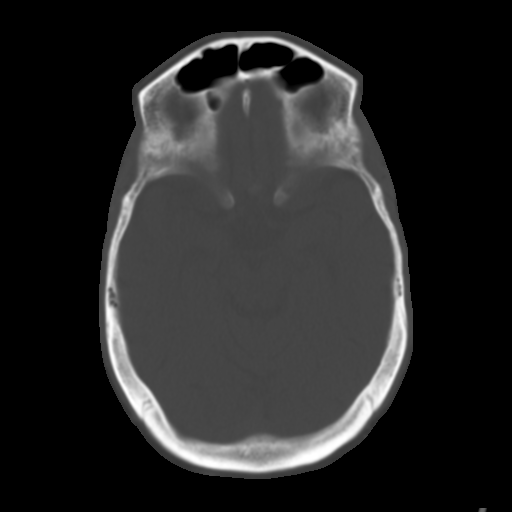
[im 21/31  brain]
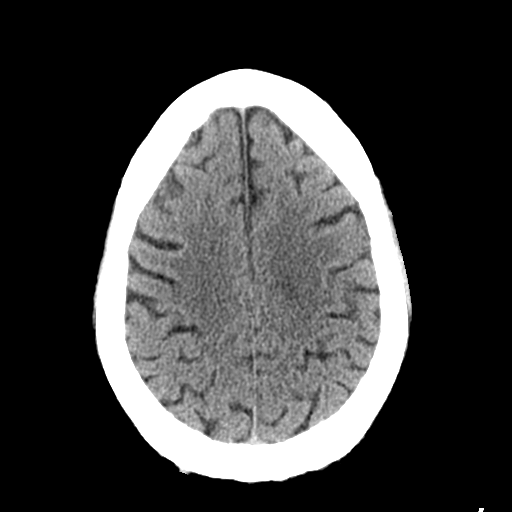

[Series 4: coronal soft tissue · coronal · 0.32mm/px · 3 of 69 slices shown]
[im 23/69  brain]
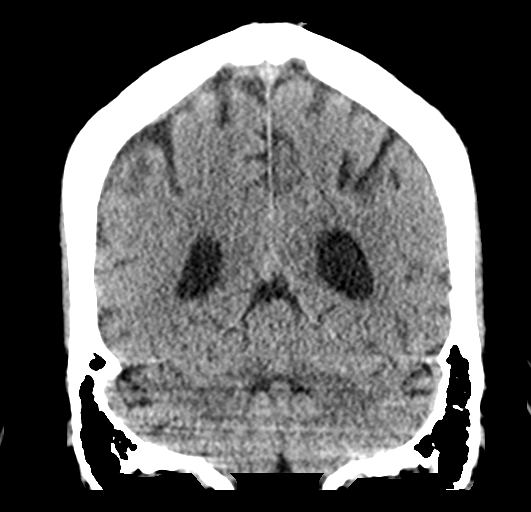
[im 35/69  brain]
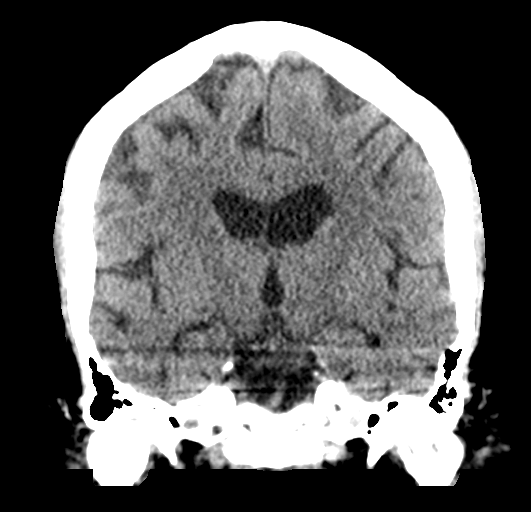
[im 46/69  brain]
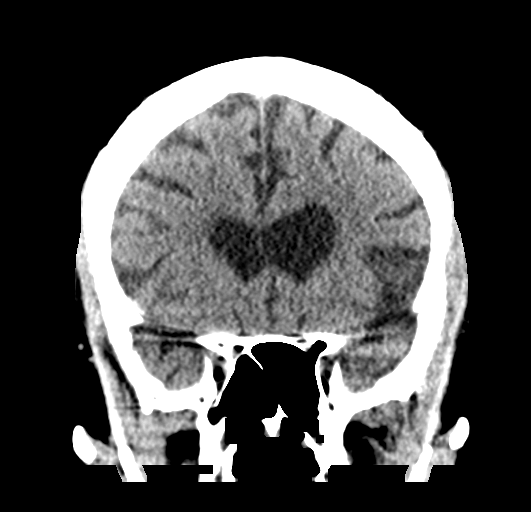

[Series 5: sagittal soft tissue · sagittal · 0.32mm/px · 1 of 56 slices shown]
[im 28/56  brain]
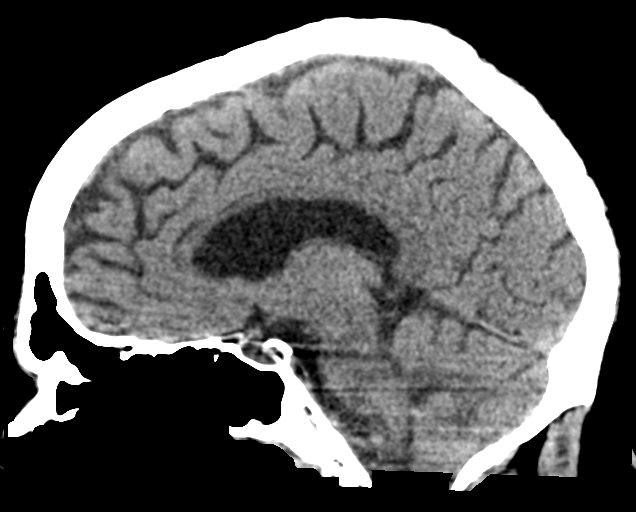

[Series 12: orthogonal bone · axial · 0.38mm/px · z∈[-220,-55]mm · 8 of 110 slices shown]
[im 8/110  bone]
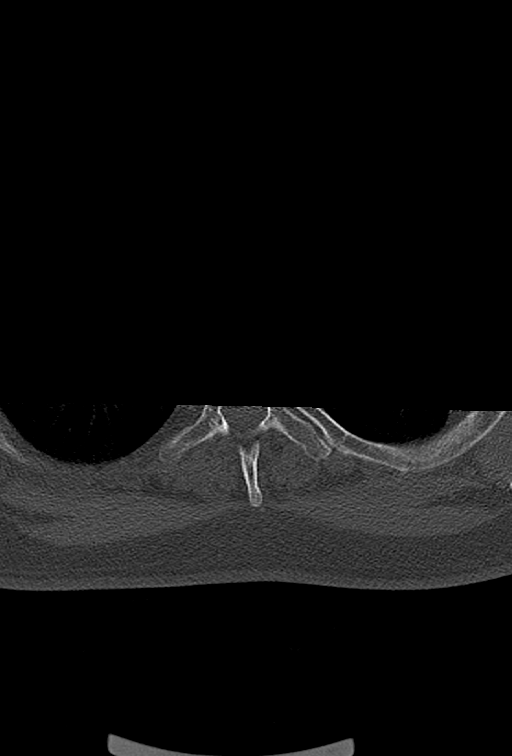
[im 24/110  bone]
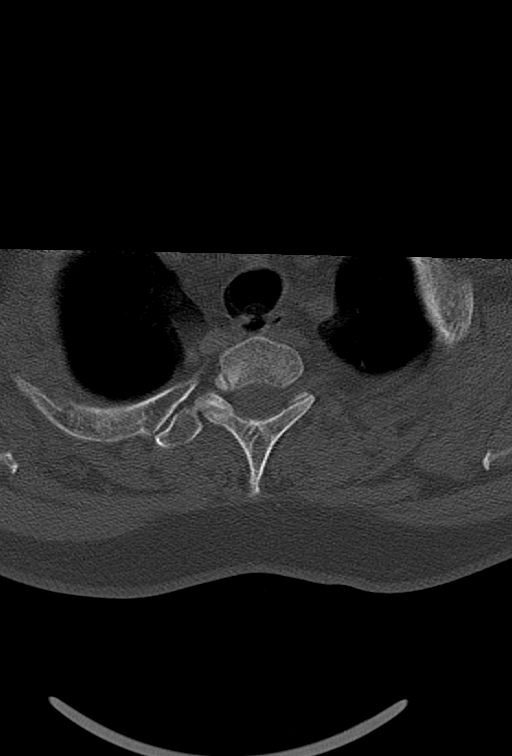
[im 39/110  bone]
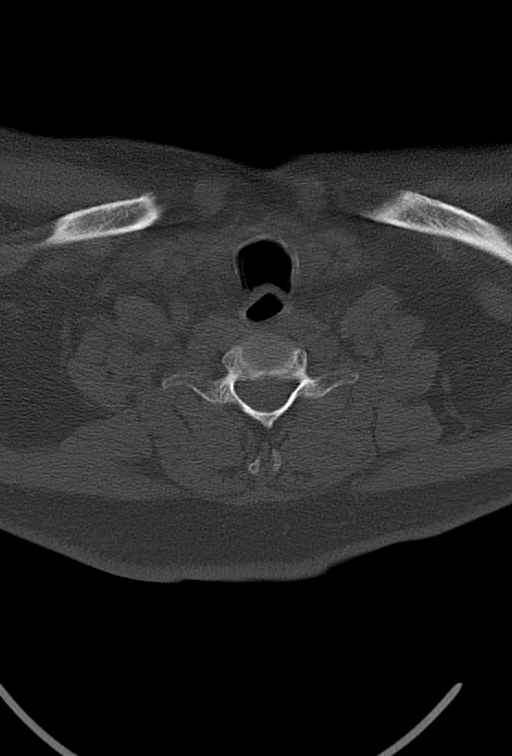
[im 47/110  bone]
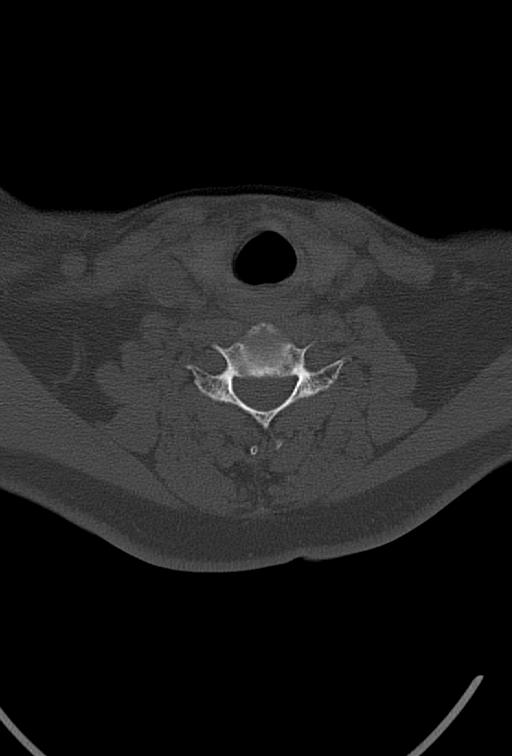
[im 63/110  bone]
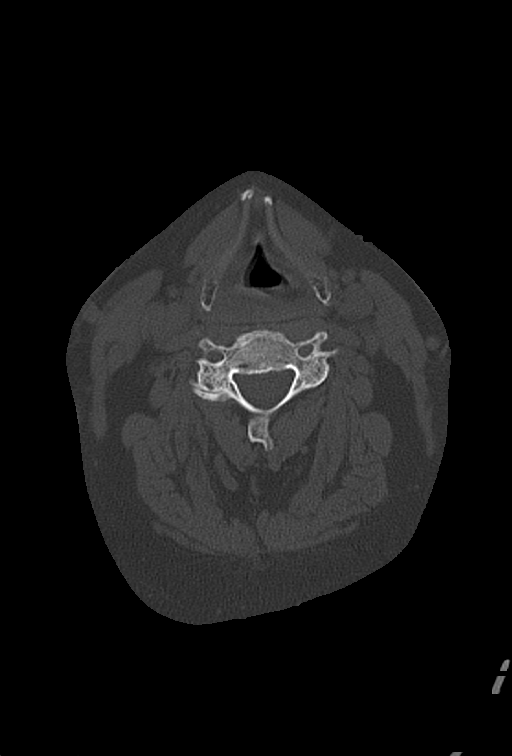
[im 71/110  bone]
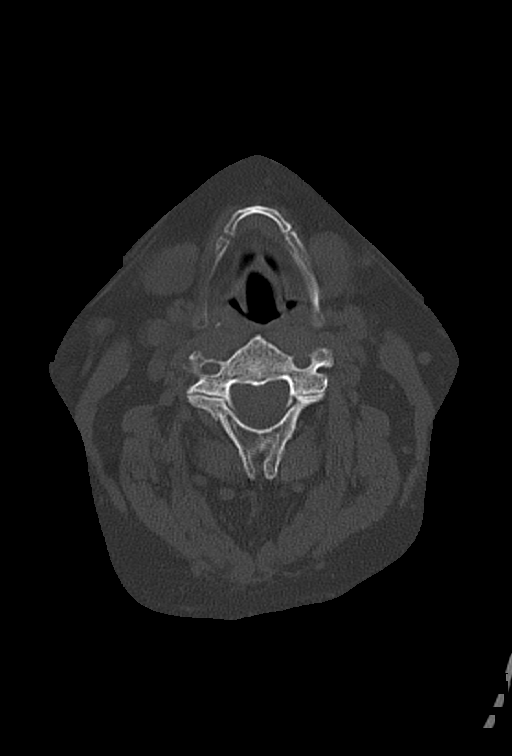
[im 86/110  bone]
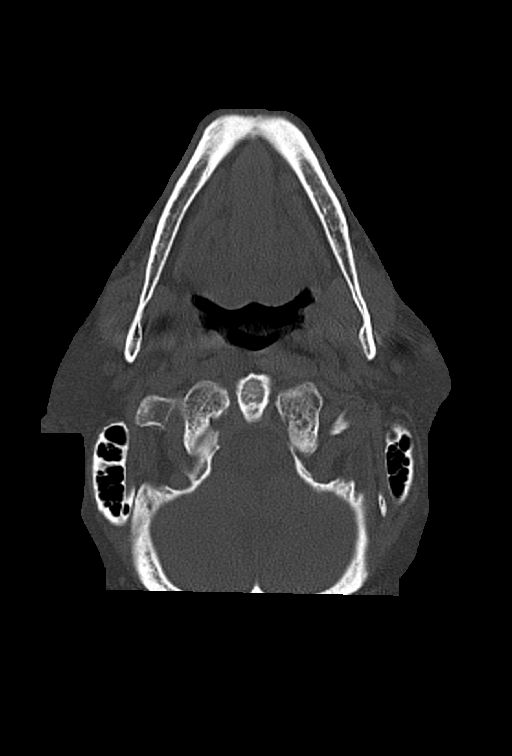
[im 102/110  bone]
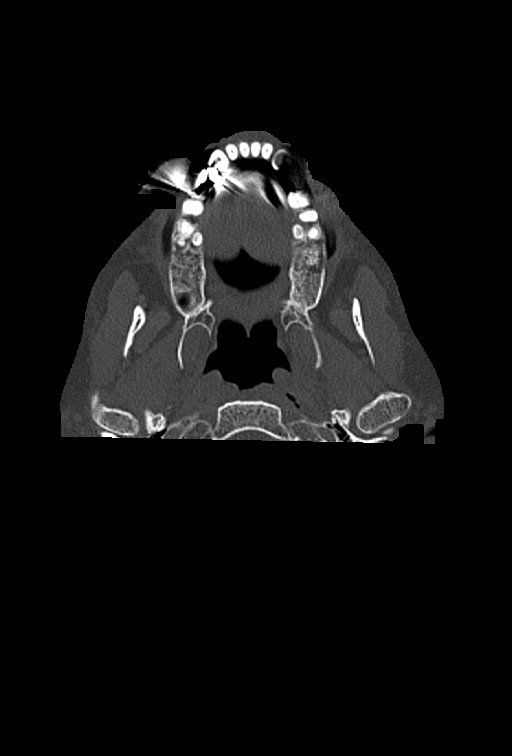

[14 of 47 positions shown; findings below may reference images not displayed]

FINDINGS: CT HEAD FINDINGS

Brain: No acute intracranial hemorrhage. No midline shift or mass
effect. Gray-white differentiation maintained. Unremarkable
appearance of the ventricular system.

Vascular: Unremarkable

Skull: No acute fracture.  No aggressive bone lesion identified.

Sinuses/Orbits: Unremarkable appearance of the orbits. Mastoid air
cells clear. No middle ear effusion. No significant sinus disease.

Other: None

CT CERVICAL SPINE FINDINGS

Alignment: Craniocervical junction aligned. Anatomic alignment of
the cervical elements. No subluxation.

Skull base and vertebrae: No acute fracture at the skullbase.
Vertebral body heights relatively maintained. No acute fracture
identified.

Soft tissues and spinal canal: Unremarkable cervical soft tissues.
Lymph nodes are present, though not enlarged.

Disc levels: Mild disc disease of C2-C3 and C3-C4. No bony canal
narrowing.

Upper chest: Unremarkable appearance of the lung apices.

Other: No bony canal narrowing.
IMPRESSION: Head CT:

No acute intracranial abnormality.

Cervical CT:

No acute fracture or malalignment of the cervical spine.

## 2020-12-20 ENCOUNTER — Other Ambulatory Visit: Payer: Self-pay

## 2020-12-20 DIAGNOSIS — N3944 Nocturnal enuresis: Secondary | ICD-10-CM

## 2020-12-23 ENCOUNTER — Ambulatory Visit: Payer: Self-pay | Admitting: Urology

## 2020-12-29 ENCOUNTER — Other Ambulatory Visit: Payer: Self-pay

## 2020-12-29 ENCOUNTER — Emergency Department: Payer: Medicare Other

## 2020-12-29 ENCOUNTER — Encounter: Payer: Self-pay | Admitting: Emergency Medicine

## 2020-12-29 DIAGNOSIS — E871 Hypo-osmolality and hyponatremia: Secondary | ICD-10-CM | POA: Diagnosis present

## 2020-12-29 DIAGNOSIS — A4189 Other specified sepsis: Principal | ICD-10-CM | POA: Diagnosis present

## 2020-12-29 DIAGNOSIS — E860 Dehydration: Secondary | ICD-10-CM | POA: Diagnosis present

## 2020-12-29 DIAGNOSIS — F79 Unspecified intellectual disabilities: Secondary | ICD-10-CM | POA: Diagnosis present

## 2020-12-29 DIAGNOSIS — U071 COVID-19: Secondary | ICD-10-CM | POA: Diagnosis present

## 2020-12-29 DIAGNOSIS — F209 Schizophrenia, unspecified: Secondary | ICD-10-CM | POA: Diagnosis present

## 2020-12-29 DIAGNOSIS — Z888 Allergy status to other drugs, medicaments and biological substances status: Secondary | ICD-10-CM

## 2020-12-29 DIAGNOSIS — F419 Anxiety disorder, unspecified: Secondary | ICD-10-CM | POA: Diagnosis present

## 2020-12-29 DIAGNOSIS — Z79899 Other long term (current) drug therapy: Secondary | ICD-10-CM

## 2020-12-29 DIAGNOSIS — I10 Essential (primary) hypertension: Secondary | ICD-10-CM | POA: Diagnosis present

## 2020-12-29 DIAGNOSIS — G9341 Metabolic encephalopathy: Secondary | ICD-10-CM | POA: Diagnosis present

## 2020-12-29 DIAGNOSIS — F32A Depression, unspecified: Secondary | ICD-10-CM | POA: Diagnosis present

## 2020-12-29 DIAGNOSIS — G9349 Other encephalopathy: Secondary | ICD-10-CM | POA: Diagnosis present

## 2020-12-29 LAB — COMPREHENSIVE METABOLIC PANEL WITH GFR
ALT: 45 U/L — ABNORMAL HIGH (ref 0–44)
AST: 36 U/L (ref 15–41)
Albumin: 4.1 g/dL (ref 3.5–5.0)
Alkaline Phosphatase: 46 U/L (ref 38–126)
Anion gap: 12 (ref 5–15)
BUN: 14 mg/dL (ref 6–20)
CO2: 26 mmol/L (ref 22–32)
Calcium: 8.7 mg/dL — ABNORMAL LOW (ref 8.9–10.3)
Chloride: 96 mmol/L — ABNORMAL LOW (ref 98–111)
Creatinine, Ser: 0.74 mg/dL (ref 0.61–1.24)
GFR, Estimated: >60 mL/min
Glucose, Bld: 112 mg/dL — ABNORMAL HIGH (ref 70–99)
Potassium: 4.3 mmol/L (ref 3.5–5.1)
Sodium: 134 mmol/L — ABNORMAL LOW (ref 135–145)
Total Bilirubin: 0.9 mg/dL (ref 0.3–1.2)
Total Protein: 7.9 g/dL (ref 6.5–8.1)

## 2020-12-29 LAB — CBC
HCT: 45 % (ref 39.0–52.0)
Hemoglobin: 16.1 g/dL (ref 13.0–17.0)
MCH: 33.3 pg (ref 26.0–34.0)
MCHC: 35.8 g/dL (ref 30.0–36.0)
MCV: 93 fL (ref 80.0–100.0)
Platelets: 86 10*3/uL — ABNORMAL LOW (ref 150–400)
RBC: 4.84 MIL/uL (ref 4.22–5.81)
RDW: 12.4 % (ref 11.5–15.5)
WBC: 3.8 10*3/uL — ABNORMAL LOW (ref 4.0–10.5)
nRBC: 0 % (ref 0.0–0.2)

## 2020-12-29 LAB — TROPONIN I (HIGH SENSITIVITY)
Troponin I (High Sensitivity): 5 ng/L (ref <18)
Troponin I (High Sensitivity): 5 ng/L (ref <18)

## 2020-12-29 NOTE — ED Triage Notes (Signed)
Pt brought in by [facility] from home, brother who is POA called due to weakness. Pt is poor historian has hx of schizophrenia. Pt denies any complaints, slight shob noted in triage.

## 2020-12-29 NOTE — ED Triage Notes (Addendum)
EMS brings pt in from home; hx "schizophrenia" and brother is POA; brother called EMS because pt is COVID+ and having weakness

## 2020-12-29 NOTE — ED Notes (Signed)
Pt's brother calls to request nurse to look at pt's rt foot because it is "purple"; pt sitting in lobby in w/c with no distress noted; rt LE with discoloration noted; W&D to touch and strong PP; pt reports that his leg has been like that for "years" after he broke his knee and denies any c/o regarding it; pt assisted to elevated legs on chair

## 2020-12-30 ENCOUNTER — Inpatient Hospital Stay
Admission: EM | Admit: 2020-12-30 | Discharge: 2021-01-03 | DRG: 871 | Disposition: A | Discharge status: Home Health | Payer: Medicare Other | Attending: Internal Medicine | Admitting: Internal Medicine

## 2020-12-30 ENCOUNTER — Encounter: Payer: Self-pay | Admitting: Family Medicine

## 2020-12-30 ENCOUNTER — Inpatient Hospital Stay: Payer: Medicare Other

## 2020-12-30 DIAGNOSIS — U071 COVID-19: Secondary | ICD-10-CM

## 2020-12-30 DIAGNOSIS — F79 Unspecified intellectual disabilities: Secondary | ICD-10-CM | POA: Diagnosis present

## 2020-12-30 DIAGNOSIS — I10 Essential (primary) hypertension: Secondary | ICD-10-CM | POA: Diagnosis present

## 2020-12-30 DIAGNOSIS — Z888 Allergy status to other drugs, medicaments and biological substances status: Secondary | ICD-10-CM | POA: Diagnosis not present

## 2020-12-30 DIAGNOSIS — A4189 Other specified sepsis: Principal | ICD-10-CM

## 2020-12-30 DIAGNOSIS — A419 Sepsis, unspecified organism: Secondary | ICD-10-CM | POA: Diagnosis not present

## 2020-12-30 DIAGNOSIS — G9349 Other encephalopathy: Secondary | ICD-10-CM

## 2020-12-30 DIAGNOSIS — R4182 Altered mental status, unspecified: Secondary | ICD-10-CM

## 2020-12-30 DIAGNOSIS — R531 Weakness: Secondary | ICD-10-CM | POA: Diagnosis not present

## 2020-12-30 DIAGNOSIS — F209 Schizophrenia, unspecified: Secondary | ICD-10-CM | POA: Diagnosis present

## 2020-12-30 DIAGNOSIS — E860 Dehydration: Secondary | ICD-10-CM | POA: Diagnosis present

## 2020-12-30 DIAGNOSIS — G9341 Metabolic encephalopathy: Secondary | ICD-10-CM | POA: Diagnosis present

## 2020-12-30 DIAGNOSIS — Z79899 Other long term (current) drug therapy: Secondary | ICD-10-CM | POA: Diagnosis not present

## 2020-12-30 DIAGNOSIS — F419 Anxiety disorder, unspecified: Secondary | ICD-10-CM | POA: Diagnosis present

## 2020-12-30 DIAGNOSIS — E871 Hypo-osmolality and hyponatremia: Secondary | ICD-10-CM | POA: Diagnosis present

## 2020-12-30 DIAGNOSIS — F32A Depression, unspecified: Secondary | ICD-10-CM | POA: Diagnosis present

## 2020-12-30 HISTORY — DX: Schizophrenia, unspecified: F20.9

## 2020-12-30 LAB — FERRITIN: Ferritin: 181 ng/mL (ref 24–336)

## 2020-12-30 LAB — RESP PANEL BY RT-PCR (FLU A&B, COVID) ARPGX2
Influenza A by PCR: NEGATIVE
Influenza B by PCR: NEGATIVE
SARS Coronavirus 2 by RT PCR: POSITIVE — AB

## 2020-12-30 LAB — FIBRIN DERIVATIVES D-DIMER (ARMC ONLY): Fibrin derivatives D-dimer (ARMC): 251.68 ng/mL (FEU) (ref 0.00–499.00)

## 2020-12-30 LAB — FIBRINOGEN: Fibrinogen: 420 mg/dL (ref 210–475)

## 2020-12-30 LAB — C-REACTIVE PROTEIN: CRP: 5.7 mg/dL — ABNORMAL HIGH (ref <1.0)

## 2020-12-30 LAB — BRAIN NATRIURETIC PEPTIDE: B Natriuretic Peptide: 19.3 pg/mL (ref 0.0–100.0)

## 2020-12-30 LAB — PROCALCITONIN: Procalcitonin: <0.1 ng/mL

## 2020-12-30 LAB — LACTATE DEHYDROGENASE: LDH: 132 U/L (ref 98–192)

## 2020-12-30 LAB — HIV ANTIBODY (ROUTINE TESTING W REFLEX): HIV Screen 4th Generation wRfx: NONREACTIVE

## 2020-12-30 LAB — TROPONIN I (HIGH SENSITIVITY): Troponin I (High Sensitivity): 3 ng/L (ref <18)

## 2020-12-30 MED ORDER — BENZTROPINE MESYLATE 0.5 MG PO TABS
0.5000 mg | ORAL_TABLET | ORAL | Status: DC
  Administered 2020-12-30 – 2021-01-03 (×4): 0.5 mg via ORAL
  Filled 2020-12-30 (×6): qty 1

## 2020-12-30 MED ORDER — PALIPERIDONE ER 6 MG PO TB24
9.0000 mg | ORAL_TABLET | Freq: Every day | ORAL | Status: DC
  Filled 2020-12-30: qty 1

## 2020-12-30 MED ORDER — PALIPERIDONE ER 3 MG PO TB24
9.0000 mg | ORAL_TABLET | Freq: Every day | ORAL | Status: DC
  Administered 2020-12-30 – 2021-01-02 (×4): 9 mg via ORAL
  Filled 2020-12-30 (×6): qty 3

## 2020-12-30 MED ORDER — SERTRALINE HCL 50 MG PO TABS
100.0000 mg | ORAL_TABLET | Freq: Two times a day (BID) | ORAL | Status: DC
  Administered 2020-12-30 – 2021-01-03 (×6): 100 mg via ORAL
  Filled 2020-12-30 (×6): qty 2

## 2020-12-30 MED ORDER — SODIUM CHLORIDE 0.9 % IV SOLN: 100.0000 mg | Freq: Every day | INTRAVENOUS | Status: DC

## 2020-12-30 MED ORDER — PREDNISONE 50 MG PO TABS
50.0000 mg | ORAL_TABLET | Freq: Every day | ORAL | Status: DC
  Administered 2020-12-31: 50 mg via ORAL
  Filled 2020-12-30: qty 1

## 2020-12-30 MED ORDER — HYDROCOD POLST-CPM POLST ER 10-8 MG/5ML PO SUER: 5.0000 mL | Freq: Two times a day (BID) | ORAL | Status: DC | PRN

## 2020-12-30 MED ORDER — GUAIFENESIN-DM 100-10 MG/5ML PO SYRP: 10.0000 mL | ORAL_SOLUTION | ORAL | Status: DC | PRN

## 2020-12-30 MED ORDER — GUAIFENESIN ER 600 MG PO TB12
600.0000 mg | ORAL_TABLET | Freq: Two times a day (BID) | ORAL | Status: DC
  Administered 2020-12-30 – 2021-01-03 (×9): 600 mg via ORAL
  Filled 2020-12-30 (×9): qty 1

## 2020-12-30 MED ORDER — MAGNESIUM HYDROXIDE 400 MG/5ML PO SUSP: 30.0000 mL | Freq: Every day | ORAL | Status: DC | PRN

## 2020-12-30 MED ORDER — ONDANSETRON HCL 4 MG/2ML IJ SOLN: 4.0000 mg | Freq: Four times a day (QID) | INTRAMUSCULAR | Status: DC | PRN

## 2020-12-30 MED ORDER — FAMOTIDINE 20 MG PO TABS
20.0000 mg | ORAL_TABLET | Freq: Two times a day (BID) | ORAL | Status: DC
  Administered 2020-12-30 – 2021-01-03 (×9): 20 mg via ORAL
  Filled 2020-12-30 (×9): qty 1

## 2020-12-30 MED ORDER — ZINC SULFATE 220 (50 ZN) MG PO CAPS
220.0000 mg | ORAL_CAPSULE | Freq: Every day | ORAL | Status: DC
  Administered 2020-12-30 – 2021-01-03 (×4): 220 mg via ORAL
  Filled 2020-12-30 (×5): qty 1

## 2020-12-30 MED ORDER — SODIUM CHLORIDE 0.9 % IV SOLN
200.0000 mg | Freq: Once | INTRAVENOUS | Status: AC
  Administered 2020-12-30: 200 mg via INTRAVENOUS
  Filled 2020-12-30: qty 200

## 2020-12-30 MED ORDER — SERTRALINE HCL 50 MG PO TABS
100.0000 mg | ORAL_TABLET | Freq: Two times a day (BID) | ORAL | Status: DC
  Administered 2020-12-30: 100 mg via ORAL
  Filled 2020-12-30: qty 2

## 2020-12-30 MED ORDER — LORAZEPAM 2 MG PO TABS
2.0000 mg | ORAL_TABLET | Freq: Three times a day (TID) | ORAL | Status: DC
Start: 2020-12-30 — End: 2021-01-03
  Administered 2020-12-30 – 2021-01-03 (×9): 2 mg via ORAL
  Filled 2020-12-30 (×10): qty 1

## 2020-12-30 MED ORDER — PALIPERIDONE ER 6 MG PO TB24
6.0000 mg | ORAL_TABLET | Freq: Every day | ORAL | Status: DC
  Administered 2020-12-31 – 2021-01-03 (×2): 6 mg via ORAL
  Filled 2020-12-30 (×5): qty 1

## 2020-12-30 MED ORDER — TRAZODONE HCL 50 MG PO TABS: 25.0000 mg | ORAL_TABLET | Freq: Every evening | ORAL | Status: DC | PRN

## 2020-12-30 MED ORDER — VITAMIN D 25 MCG (1000 UNIT) PO TABS
1000.0000 [IU] | ORAL_TABLET | Freq: Every day | ORAL | Status: DC
  Administered 2020-12-30 – 2021-01-03 (×5): 1000 [IU] via ORAL
  Filled 2020-12-30 (×5): qty 1

## 2020-12-30 MED ORDER — IOHEXOL 350 MG/ML SOLN
75.0000 mL | Freq: Once | INTRAVENOUS | Status: AC | PRN
  Administered 2020-12-30: 75 mL via INTRAVENOUS

## 2020-12-30 MED ORDER — POLYETHYLENE GLYCOL 3350 17 G PO PACK: 17.0000 g | PACK | Freq: Every day | ORAL | Status: DC | PRN

## 2020-12-30 MED ORDER — ASCORBIC ACID 500 MG PO TABS: 500.0000 mg | ORAL_TABLET | Freq: Every day | ORAL | Status: DC

## 2020-12-30 MED ORDER — SODIUM CHLORIDE 0.9 % IV SOLN: INTRAVENOUS | Status: DC

## 2020-12-30 MED ORDER — DIVALPROEX SODIUM 500 MG PO DR TAB
1000.0000 mg | DELAYED_RELEASE_TABLET | Freq: Two times a day (BID) | ORAL | Status: DC
  Administered 2020-12-30 – 2021-01-03 (×7): 1000 mg via ORAL
  Filled 2020-12-30 (×10): qty 2

## 2020-12-30 MED ORDER — BENZTROPINE MESYLATE 1 MG PO TABS
1.0000 mg | ORAL_TABLET | ORAL | Status: DC
  Administered 2020-12-30 – 2021-01-03 (×8): 1 mg via ORAL
  Filled 2020-12-30 (×9): qty 1

## 2020-12-30 MED ORDER — PROPRANOLOL HCL 20 MG PO TABS
20.0000 mg | ORAL_TABLET | Freq: Every day | ORAL | Status: DC
  Administered 2020-12-30: 20 mg via ORAL
  Filled 2020-12-30: qty 1

## 2020-12-30 MED ORDER — LORAZEPAM 1 MG PO TABS: 1.0000 mg | ORAL_TABLET | Freq: Two times a day (BID) | ORAL | Status: DC | PRN

## 2020-12-30 MED ORDER — OLANZAPINE 5 MG PO TABS
5.0000 mg | ORAL_TABLET | Freq: Two times a day (BID) | ORAL | Status: DC
  Administered 2020-12-31 – 2021-01-03 (×6): 5 mg via ORAL
  Filled 2020-12-30 (×5): qty 1

## 2020-12-30 MED ORDER — OLANZAPINE 5 MG PO TABS
15.0000 mg | ORAL_TABLET | Freq: Every morning | ORAL | Status: DC
  Administered 2020-12-30: 15 mg via ORAL
  Filled 2020-12-30: qty 1

## 2020-12-30 MED ORDER — ASPIRIN EC 81 MG PO TBEC
81.0000 mg | DELAYED_RELEASE_TABLET | Freq: Every day | ORAL | Status: DC
  Administered 2020-12-30 – 2021-01-03 (×5): 81 mg via ORAL
  Filled 2020-12-30 (×5): qty 1

## 2020-12-30 MED ORDER — ONDANSETRON HCL 4 MG PO TABS: 4.0000 mg | ORAL_TABLET | Freq: Four times a day (QID) | ORAL | Status: DC | PRN

## 2020-12-30 MED ORDER — SODIUM CHLORIDE 0.9 % IV SOLN
100.0000 mg | Freq: Every day | INTRAVENOUS | Status: DC
  Administered 2020-12-31: 100 mg via INTRAVENOUS
  Filled 2020-12-30: qty 100

## 2020-12-30 MED ORDER — ASCORBIC ACID 500 MG PO TABS
500.0000 mg | ORAL_TABLET | Freq: Every day | ORAL | Status: DC
  Administered 2020-12-30 – 2021-01-03 (×5): 500 mg via ORAL
  Filled 2020-12-30 (×5): qty 1

## 2020-12-30 MED ORDER — PROPRANOLOL HCL 20 MG PO TABS
20.0000 mg | ORAL_TABLET | Freq: Two times a day (BID) | ORAL | Status: DC
  Administered 2020-12-30 – 2021-01-03 (×6): 20 mg via ORAL
  Filled 2020-12-30 (×6): qty 1

## 2020-12-30 MED ORDER — OLANZAPINE 5 MG PO TABS: 5.0000 mg | ORAL_TABLET | Freq: Every evening | ORAL | Status: DC

## 2020-12-30 MED ORDER — VITAMIN D 25 MCG (1000 UNIT) PO TABS: 1000.0000 [IU] | ORAL_TABLET | Freq: Every day | ORAL | Status: DC

## 2020-12-30 MED ORDER — ENOXAPARIN SODIUM 60 MG/0.6ML ~~LOC~~ SOLN
60.0000 mg | SUBCUTANEOUS | Status: DC
  Administered 2020-12-30 – 2021-01-03 (×3): 60 mg via SUBCUTANEOUS
  Filled 2020-12-30 (×2): qty 0.6

## 2020-12-30 NOTE — ED Notes (Signed)
Pt transported to CT ?

## 2020-12-30 NOTE — Progress Notes (Signed)
OT Cancellation Note  Patient Details Name: Ethan Alvarez MRN: [id number] DOB: 26-Feb-1961   Cancelled Treatment:    Reason Eval/Treat Not Completed: Other (comment). Consult received, chart reviewed. Pt unavailable for OT evaluation. Will re-attempt next date as medically appropriate and available.  Richrd Prime, MPH, MS, OTR/L ascom [phone] 12/30/20, 2:33 PM

## 2020-12-30 NOTE — H&P (Addendum)
[place]   PATIENT NAME: Ethan Alvarez    MR#:  [id number]  DATE OF BIRTH:  Mar 19, 1961  DATE OF ADMISSION:  12/30/2020  PRIMARY CARE PHYSICIAN: Bernette Redbird, MD   REQUESTING/REFERRING PHYSICIAN: Merwyn Katos, MD  CHIEF COMPLAINT:   Chief Complaint  Patient presents with  . Weakness    HISTORY OF PRESENT ILLNESS:  Ethan Alvarez  is a 60 y.o. Caucasian male with a known history of hypertension and schizophrenia, who presented to the ER with acute onset of generalized weakness over the last few days with altered mental status with significant lethargy.  She was having mild respiratory distress during triage.  According to the brother who gives most of the history he has been feeling so weak that he can get out of chair.  He was coughing a little bit that without expectoration and was given Robitussin.  He has dyspnea on exertion and dyspnea at rest but no more than his usual.  No wheezing.  He had low-grade fever slightly above 99.  No nausea or vomiting or diarrhea no loss of taste or smell.  He has been stating that he feels exhausted.  He had been vaccinated against COVID-19 with 2 shots.  Upon presentation to the emergency room, temperature 100.4 blood pressure 142/69, heart rate 113 respiratory rate of 26 with pulse oximetry of 93% on room air and it has been down to 91% on room air and pulse oximetry later on was 95 to 99% on room air.  COVID-19 PCR came back positive and influenza antigens came back negative.  High-sensitivity troponin I was 5 twice.EKG showed sinus tachycardia with a rate of 114 with inferior Q waves as well as Q waves in V3.  CBC was unremarkable CMP revealed mild hyponatremia and hypochloremia and ALT of 45.2 to 2 view chest x-ray shows low lung volumes with bibasal scarring.  Chest CTA showed chronic hepatosplenomegaly with atelectasis and no evidence for PE  The patient will be admitted to a medical monitored bed for further evaluation and  management.  PAST MEDICAL HISTORY:   Past Medical History:  Diagnosis Date  . Hypertension   . Schizophrenia (HCC)     PAST SURGICAL HISTORY:   Past Surgical History:  Procedure Laterality Date  . INCISION AND DRAINAGE Right 06/02/2019   Procedure: INCISION AND DRAINAGE;  Surgeon: Christena Flake, MD;  Location: [facility] ORS;  Service: Orthopedics;  Laterality: Right;  . QUADRICEPS TENDON REPAIR Right 04/25/2019   Procedure: REPAIR QUADRICEP TENDON;  Surgeon: Christena Flake, MD;  Location: [facility] ORS;  Service: Orthopedics;  Laterality: Right;  . QUADRICEPS TENDON REPAIR Right 06/02/2019   Procedure: REPAIR QUADRICEP TENDON;  Surgeon: Christena Flake, MD;  Location: [facility] ORS;  Service: Orthopedics;  Laterality: Right;    SOCIAL HISTORY:   Social History   Tobacco Use  . Smoking status: Never Smoker  . Smokeless tobacco: Never Used  Substance Use Topics  . Alcohol use: Not Currently    FAMILY HISTORY:  No family history on file.  DRUG ALLERGIES:   Allergies  Allergen Reactions  . Haloperidol Lactate Other (See Comments)    Per [facility]; nature of intolerance unclear intolerance Other reaction(s): Other (See Comments) Per [facility]; nature of intolerance unclear     REVIEW OF SYSTEMS:   ROS As per history of present illness. All pertinent systems were reviewed above. Constitutional, HEENT, cardiovascular, respiratory, GI, GU, musculoskeletal, neuro, psychiatric, endocrine, integumentary and hematologic systems were reviewed  and are otherwise negative/unremarkable except for positive findings mentioned above in the HPI.   MEDICATIONS AT HOME:   Prior to Admission medications   Medication Sig Start Date End Date Taking? Authorizing Provider  acetaminophen (TYLENOL) 325 MG tablet Take 650 mg by mouth 2 (two) times daily.    [provider]  ascorbic acid (VITAMIN C) 500 MG tablet Take 500 mg by mouth daily.    [provider]  benztropine (COGENTIN) 0.5 MG tablet Take  0.5 mg by mouth every morning.     [provider]  benztropine (COGENTIN) 1 MG tablet Take 1 mg by mouth 2 (two) times daily.  03/25/19   [provider]  cholecalciferol (VITAMIN D3) 25 MCG (1000 UT) tablet Take 1,000 Units by mouth daily.    [provider]  divalproex (DEPAKOTE) 500 MG DR tablet Take 1,000 mg by mouth 2 (two) times a day. 02/13/17 09/24/19  [provider]  INVEGA SUSTENNA 234 MG/1.5ML SUSY injection Inject 234 mg into the muscle every 30 (thirty) days. 04/10/19   [provider]  LORazepam (ATIVAN) 1 MG tablet Take 1 tablet (1 mg total) by mouth 2 (two) times daily. Patient taking differently: Take 1 mg by mouth 2 (two) times daily as needed for anxiety or sleep.  09/15/19   Katha Hamming, MD  OLANZapine (ZYPREXA) 5 MG tablet Take 5-15 mg by mouth See admin instructions. 15 Mg in AM and 5 MG in afternoon    [provider]  polyethylene glycol (MIRALAX / GLYCOLAX) 17 g packet Take 17 g by mouth daily as needed for mild constipation. 06/08/19   Mayo, Allyn Kenner, MD  propranolol (INDERAL) 20 MG tablet Take 20 mg by mouth daily.  08/12/18   [provider]  sertraline (ZOLOFT) 100 MG tablet Take 100 mg by mouth 2 (two) times a day. 01/30/18 09/24/19  [provider]      VITAL SIGNS:  Blood pressure 127/86, pulse 78, temperature 99.2 F (37.3 C), temperature source Oral, resp. rate (!) 21, height 5\' 8"  (1.727 m), weight 113.4 kg, SpO2 93 %.  PHYSICAL EXAMINATION:  Physical Exam  GENERAL:  60 y.o.-year-old Caucasian male patient lying in the bed with no acute distress.  EYES: Pupils equal, round, reactive to light and accommodation. No scleral icterus. Extraocular muscles intact.  HEENT: Head atraumatic, normocephalic. Oropharynx and nasopharynx clear.  NECK:  Supple, no jugular venous distention. No thyroid enlargement, no tenderness.  LUNGS: Minimally diminished bibasal breath sounds.  No wheezing,  rales,rhonchi or crepitation. No use of accessory muscles of respiration.  CARDIOVASCULAR: Regular rate and rhythm, S1, S2 normal. No murmurs, rubs, or gallops.  ABDOMEN: Soft, nondistended, nontender. Bowel sounds present. No organomegaly or mass.  EXTREMITIES: No pedal edema, cyanosis, or clubbing.  NEUROLOGIC: Cranial nerves II through XII are intact. Muscle strength 5/5 in all extremities. Sensation intact. Gait not checked.  PSYCHIATRIC: The patient is alert and oriented x 3.  Normal affect and good eye contact. SKIN: No obvious rash, lesion, or ulcer.   LABORATORY PANEL:   CBC Recent Labs  Lab 12/29/20 1951  WBC 3.8*  HGB 16.1  HCT 45.0  PLT 86*   ------------------------------------------------------------------------------------------------------------------  Chemistries  Recent Labs  Lab 12/29/20 1951  NA 134*  K 4.3  CL 96*  CO2 26  GLUCOSE 112*  BUN 14  CREATININE 0.74  CALCIUM 8.7*  AST 36  ALT 45*  ALKPHOS 46  BILITOT 0.9   ------------------------------------------------------------------------------------------------------------------  Cardiac Enzymes  No results for input(s): TROPONINI in the last 168 hours. ------------------------------------------------------------------------------------------------------------------  RADIOLOGY:  DG Chest 2 View  Result Date: 12/29/2020 CLINICAL DATA:  Shortness of breath EXAM: CHEST - 2 VIEW COMPARISON:  09/10/2019 FINDINGS: Low lung volumes, bibasilar scarring, similar to prior study. No effusions. Heart is normal size. No acute bony abnormality. IMPRESSION: Low lung volumes with bibasilar scarring, stable. No active disease. Electronically Signed   By: Charlett Nose M.D.   On: 12/29/2020 20:36    IMPRESSION AND PLAN:  1.    Acute encephalopathy and sepsis secondary to COVID-19 .  Sepsis is manifested by tachycardia and tachypnea. - The patient will be admitted to a medical monitored isolation bed. - O2  protocol will be followed. - We will place the patient on IV remdesivir. - We will follow inflammatory markers. - We will follow neuro checks every 4 hours for 24 hours. - Chest CTA was ordered to assess for PE and COVID-19 pneumonia and came back negative for both.  2.  Essential hypertension. - We will continue propranolol.  3.  Schizophrenia. - We will continue olanzapine and Invega as well as Cogentin.  4.  Depression and anxiety. - We will continue Zoloft and Ativan as well as Depakote.  5.  DVT prophylaxis. - Subcutaneous Lovenox.  All the records are reviewed and case discussed with ED provider. The plan of care was discussed in details with the patient (and family). I answered all questions. The patient agreed to proceed with the above mentioned plan. Further management will depend upon hospital course.   CODE STATUS: Full code  Status is: Inpatient  Remains inpatient appropriate because:Altered mental status, Ongoing diagnostic testing needed not appropriate for outpatient work up, Unsafe d/c plan, IV treatments appropriate due to intensity of illness or inability to take PO and Inpatient level of care appropriate due to severity of illness   Dispo: The patient is from: Home              Anticipated d/c is to: Home              Anticipated d/c date is: 3 days              Patient currently is not medically stable to d/c.  TOTAL TIME TAKING CARE OF THIS PATIENT: 55 minutes.    Hannah Beat M.D on 12/30/2020 at 4:42 AM  [facility]   From 7 PM-7 AM, contact night-coverage [email or web address]  CC: Primary care physician; Bernette Redbird, MD

## 2020-12-30 NOTE — ED Notes (Signed)
This RN on phone with pt's legal guardian, [name], to update on POC.

## 2020-12-30 NOTE — Progress Notes (Signed)
Remdesivir - Pharmacy Brief Note     A/P:  Remdesivir 200 mg IVPB once followed by 100 mg IVPB daily x 4 days.   Valrie Hart, PharmD Clinical Pharmacist   12/30/2020 5:25 AM

## 2020-12-30 NOTE — Progress Notes (Signed)
PHARMACIST - PHYSICIAN COMMUNICATION  CONCERNING:  Enoxaparin (Lovenox) for DVT Prophylaxis    RECOMMENDATION: Patient was prescribed enoxaprin 40mg  q24 hours for VTE prophylaxis.   Filed Weights   12/29/20 1947  Weight: 113.4 kg (250 lb)    Body mass index is 38.01 kg/m.  Estimated Creatinine Clearance: 123 mL/min (by C-G formula based on SCr of 0.74 mg/dL).   Based on [facility] policy patient is candidate for enoxaparin 0.5mg /kg TBW SQ every 24 hours based on BMI being >30.  DESCRIPTION: Pharmacy has adjusted enoxaparin dose per [facility] policy.  Patient is now receiving enoxaparin 60 mg every 24 hours  Monitor Platelets, CBC   [facility], PharmD Clinical Pharmacist  12/30/2020 7:23 AM

## 2020-12-30 NOTE — Progress Notes (Signed)
[facility]  - [place] at [facility]   PATIENT NAME: Ethan Alvarez    MR#:  [id number]  DATE OF BIRTH:  August 23, 1961  SUBJECTIVE:  patient came in from home with increasing weakness noticed my brother was a healthcare power of attorney. Spoke with Ethan Alvarez on the phone. Patient is resting quietly in the ER. No new issues per RN. No respiratory distress fever. No chest pain or shortness of breath. Seen by speech therapy earlier.  REVIEW OF SYSTEMS:   Review of Systems  Unable to perform ROS: Mental acuity   Tolerating Diet: Tolerating PT:   DRUG ALLERGIES:   Allergies  Allergen Reactions  . Haloperidol Lactate Other (See Comments)    Per [facility]; nature of intolerance unclear intolerance Other reaction(s): Other (See Comments) Per [facility]; nature of intolerance unclear     VITALS:  Blood pressure (!) 142/59, pulse 92, temperature 98.9 F (37.2 C), resp. rate (!) 24, height 5\' 8"  (1.727 m), weight 113.4 kg, SpO2 95 %.  PHYSICAL EXAMINATION:   Physical Exam  GENERAL:  60 y.o.-year-old patient lying in the bed with no acute distress. obese HEENT: Head atraumatic, normocephalic. Oropharynx and nasopharynx clear.  LUNGS: Normal breath sounds bilaterally, no wheezing, rales, rhonchi. No use of accessory muscles of respiration.  CARDIOVASCULAR: S1, S2 normal. No murmurs, rubs, or gallops.  ABDOMEN: Soft, nontender, nondistended. Bowel sounds present. No organomegaly or mass.  EXTREMITIES: No cyanosis, clubbing or edema b/l.    NEUROLOGIC: moves all extremities well PSYCHIATRIC:  patient is alert  SKIN: No obvious rash, lesion, or ulcer.   LABORATORY PANEL:  CBC Recent Labs  Lab 12/29/20 1951  WBC 3.8*  HGB 16.1  HCT 45.0  PLT 86*    Chemistries  Recent Labs  Lab 12/29/20 1951  NA 134*  K 4.3  CL 96*  CO2 26  GLUCOSE 112*  BUN 14  CREATININE 0.74  CALCIUM 8.7*  AST 36  ALT 45*  ALKPHOS 46  BILITOT 0.9   Cardiac Enzymes No results for input(s):  TROPONINI in the last 168 hours. RADIOLOGY:  DG Chest 2 View  Result Date: 12/29/2020 CLINICAL DATA:  Shortness of breath EXAM: CHEST - 2 VIEW COMPARISON:  09/10/2019 FINDINGS: Low lung volumes, bibasilar scarring, similar to prior study. No effusions. Heart is normal size. No acute bony abnormality. IMPRESSION: Low lung volumes with bibasilar scarring, stable. No active disease. Electronically Signed   By: 09/12/2019 M.D.   On: 12/29/2020 20:36   CT Angio Chest PE W/Cm &/Or Wo Cm  Result Date: 12/30/2020 CLINICAL DATA:  COVID positive, weakness, history of schizophrenia EXAM: CT ANGIOGRAPHY CHEST WITH CONTRAST TECHNIQUE: Multidetector CT imaging of the chest was performed using the standard protocol during bolus administration of intravenous contrast. Multiplanar CT image reconstructions and MIPs were obtained to evaluate the vascular anatomy. CONTRAST:  72mL OMNIPAQUE IOHEXOL 350 MG/ML SOLN COMPARISON:  CT 05/31/2020 FINDINGS: Cardiovascular: Satisfactory opacification of pulmonary arteries. No large central or lobar filling defects are identified. More distal evaluation limited by respiratory motion artifact. Central pulmonary arteries are normal caliber. Heart is borderline enlarged with some right atrioventricular prominence, similar to comparison exam. The aortic root is suboptimally assessed given cardiac pulsation artifact. The aorta is normal caliber. No acute luminal abnormality of the imaged aorta. No periaortic stranding or hemorrhage. Shared origin of the brachiocephalic and left common carotid arteries. Proximal great vessels are otherwise unremarkable. Mediastinum/Nodes: No mediastinal fluid or gas. Normal thyroid gland and thoracic inlet. No acute  abnormality of the trachea or esophagus. No worrisome mediastinal, hilar or axillary adenopathy. Lungs/Pleura: There are extensive atelectatic changes which are likely accentuated by imaging during exhalation. No focal consolidative opacity. No  convincing CT features of edema. No pneumothorax. No visible effusion. No concerning pulmonary nodules or masses. Upper Abdomen: Chronic hepatosplenomegaly. No acute abnormalities present in the visualized portions of the upper abdomen. Musculoskeletal: Multilevel degenerative changes are present in the imaged portions of the spine. Multiple Schmorl's nodes with cupping of the superior endplates in the midthoracic spine, grossly unchanged from comparison imaging. No worrisome chest wall masses or lesions. Review of the MIP images confirms the above findings. IMPRESSION: 1. Satisfactory opacification of pulmonary arteries. No large central or lobar filling defects are identified. More distal evaluation limited by respiratory motion artifact. 2. Atelectasis, no other acute intrathoracic process. 3. Chronic hepatosplenomegaly. Electronically Signed   By: Kreg Shropshire M.D.   On: 12/30/2020 05:36   ASSESSMENT AND PLAN:  Ethan Alvarez  is a 60 y.o. Caucasian male with a known history of hypertension and schizophrenia, who presented to the ER with acute onset of generalized weakness over the last few days with altered mental status with significant lethargy.  According to the brother Ethan Alvarez who gives most of the history he has been feeling so weak that he can get out of chair  Acute encephalopathy and sepsis secondary to COVID-19 .  Sepsis is manifested by tachycardia and tachypnea and wbc <4K --patient on IV remdesivir-- medically not exhibiting any respiratory symptoms. Give three doses. -  CRP 5.7. Pro calcitonin <0.10 - Chest CTA was ordered to assess for PE and COVID-19 pneumonia and came back negative for both. -- Sats are more than 92% on room air -- check UA and urine culture    Essential hypertension. -  continue propranolol.  Chronic  Schizophrenia. -  continue olanzapine,Cogentin, invega, sertraline,ativan. -- Patient follows with [facility] psychiatry  Depression and anxiety. - continue Zoloft and  Ativan as well as Depakote.  Generalized weakness -- PT OT to see patient    DVT prophylaxis. - Subcutaneous Lovenox.  Procedures: Family communication : brother Ethan Alvarez on the phone Consults : CODE STATUS: partial DVT Prophylaxis : Lovenox  Status is: Inpatient  Remains inpatient appropriate because:Inpatient level of care appropriate due to severity of illness   Dispo: The patient is from: Home              Anticipated d/c is to: Home              Anticipated d/c date is: 1 day              Patient currently is not medically stable to d/c.  Patient came in with generalized weakness notable to perform ADLs per brother at home. So far no source of infection identified. Will check a UA in urine culture. PT OT  pending.  TOC for discharge planning.    TOTAL TIME TAKING CARE OF THIS PATIENT: 25 minutes.  >50% time spent on counselling and coordination of care  Note: This dictation was prepared with Dragon dictation along with smaller phrase technology. Any transcriptional errors that result from this process are unintentional.  Enedina Finner M.D    [facility]   CC: Primary care physician; Bernette Redbird, MDPatient ID: [name], male   DOB: 10/22/1961, 60 y.o.   MRN: [id number]

## 2020-12-30 NOTE — ED Provider Notes (Signed)
[facility] Emergency Department Provider Note   ____________________________________________   Event Date/Time   First MD Initiated Contact with Patient 12/30/20 0209     (approximate)  I have reviewed the triage vital signs and the nursing notes.   HISTORY  Chief Complaint Weakness    HPI Ethan Alvarez is a 60 y.o. male with a history of schizophrenia and hypertension who presents via EMS from home after caretaker called due to increasing weakness and altered mental status.  Patient's brother and EMS provide this history as patient is a poor historian.  Patient's brother states that he is normally slightly disoriented but has been extremely lethargic and has been worsening over the last 3 days.  Per nursing notes, patient tested positive for COVID however other family members deny patient having positive coronavirus test.  Slight respiratory distress noted at triage.         Past Medical History:  Diagnosis Date  . Hypertension   . Schizophrenia [facility])     Patient Active Problem List   Diagnosis Date Noted  . Aggressive behavior of adult 09/25/2019  . Suicidal ideation 09/25/2019  . Fall 09/14/2019  . MSSA bacteremia 07/09/2019  . Septic arthritis (HCC) 07/09/2019  . Cellulitis of right knee 06/01/2019  . Rupture of right quadriceps tendon 04/25/2019    Past Surgical History:  Procedure Laterality Date  . INCISION AND DRAINAGE Right 06/02/2019   Procedure: INCISION AND DRAINAGE;  Surgeon: Christena Flake, MD;  Location: [facility] ORS;  Service: Orthopedics;  Laterality: Right;  . QUADRICEPS TENDON REPAIR Right 04/25/2019   Procedure: REPAIR QUADRICEP TENDON;  Surgeon: Christena Flake, MD;  Location: [facility] ORS;  Service: Orthopedics;  Laterality: Right;  . QUADRICEPS TENDON REPAIR Right 06/02/2019   Procedure: REPAIR QUADRICEP TENDON;  Surgeon: Christena Flake, MD;  Location: [facility] ORS;  Service: Orthopedics;  Laterality: Right;    Prior to Admission  medications   Medication Sig Start Date End Date Taking? Authorizing Provider  acetaminophen (TYLENOL) 325 MG tablet Take 650 mg by mouth 2 (two) times daily.    [provider]  ascorbic acid (VITAMIN C) 500 MG tablet Take 500 mg by mouth daily.    [provider]  benztropine (COGENTIN) 0.5 MG tablet Take 0.5 mg by mouth every morning.     [provider]  benztropine (COGENTIN) 1 MG tablet Take 1 mg by mouth 2 (two) times daily.  03/25/19   [provider]  cholecalciferol (VITAMIN D3) 25 MCG (1000 UT) tablet Take 1,000 Units by mouth daily.    [provider]  divalproex (DEPAKOTE) 500 MG DR tablet Take 1,000 mg by mouth 2 (two) times a day. 02/13/17 09/24/19  [provider]  INVEGA SUSTENNA 234 MG/1.5ML SUSY injection Inject 234 mg into the muscle every 30 (thirty) days. 04/10/19   [provider]  LORazepam (ATIVAN) 1 MG tablet Take 1 tablet (1 mg total) by mouth 2 (two) times daily. Patient taking differently: Take 1 mg by mouth 2 (two) times daily as needed for anxiety or sleep.  09/15/19   Katha Hamming, MD  OLANZapine (ZYPREXA) 5 MG tablet Take 5-15 mg by mouth See admin instructions. 15 Mg in AM and 5 MG in afternoon    [provider]  polyethylene glycol (MIRALAX / GLYCOLAX) 17 g packet Take 17 g by mouth daily as needed for mild constipation. 06/08/19   Mayo, Allyn Kenner, MD  propranolol (INDERAL) 20 MG tablet Take 20  mg by mouth daily.  08/12/18   [provider]  sertraline (ZOLOFT) 100 MG tablet Take 100 mg by mouth 2 (two) times a day. 01/30/18 09/24/19  [provider]    Allergies Haloperidol lactate  No family history on file.  Social History Social History   Tobacco Use  . Smoking status: Never Smoker  . Smokeless tobacco: Never Used  Substance Use Topics  . Alcohol use: Not Currently  . Drug use: Not Currently    Review of Systems To assess secondary to mental  status ____________________________________________   PHYSICAL EXAM:  VITAL SIGNS: ED Triage Vitals  Enc Vitals Group     BP 12/29/20 1931 (!) 142/69     Pulse Rate 12/29/20 1931 (!) 113     Resp 12/29/20 1931 (!) 26     Temp 12/29/20 1931 (!) 100.4 F (38 C)     Temp Source 12/29/20 1931 Oral     SpO2 12/29/20 1911 95 %     Weight 12/29/20 1947 250 lb (113.4 kg)     Height 12/29/20 1947 5\' 8"  (1.727 m)     Head Circumference --      Peak Flow --      Pain Score 12/29/20 1947 0     Pain Loc --      Pain Edu? --      Excl. in GC? --    Constitutional: Alert and orientedx1. Well appearing and in no acute distress. Eyes: Conjunctivae are normal. PERRL. Head: Atraumatic. Nose: No congestion/rhinnorhea. Mouth/Throat: Mucous membranes are moist. Neck: No stridor Cardiovascular: Grossly normal heart sounds.  Good peripheral circulation. Respiratory: Normal respiratory effort.  No retractions. Gastrointestinal: Soft and nontender. No distention. Musculoskeletal: No obvious deformities Neurologic:  Normal speech and language.  Significant generalized weakness in upper and lower extremities Skin:  Skin is warm and dry. No rash noted. Psychiatric: Cooperative  ____________________________________________   LABS (all labs ordered are listed, but only abnormal results are displayed)  Labs Reviewed  RESP PANEL BY RT-PCR (FLU A&B, COVID) ARPGX2 - Abnormal; Notable for the following components:      Result Value   SARS Coronavirus 2 by RT PCR POSITIVE (*)    All other components within normal limits  CBC - Abnormal; Notable for the following components:   WBC 3.8 (*)    Platelets 86 (*)    All other components within normal limits  COMPREHENSIVE METABOLIC PANEL - Abnormal; Notable for the following components:   Sodium 134 (*)    Chloride 96 (*)    Glucose, Bld 112 (*)    Calcium 8.7 (*)    ALT 45 (*)    All other components within normal limits  URINALYSIS, COMPLETE  (UACMP) WITH MICROSCOPIC  TROPONIN I (HIGH SENSITIVITY)  TROPONIN I (HIGH SENSITIVITY)   ____________________________________________  EKG  ED ECG REPORT I, 12/31/20, the attending physician, personally viewed and interpreted this ECG.  Date: 12/29/2020 EKG Time: 1938 Rate: 114 Rhythm: normal sinus rhythm QRS Axis: normal Intervals: normal ST/T Wave abnormalities: normal Narrative Interpretation: no evidence of acute ischemia  ____________________________________________  RADIOLOGY  ED MD interpretation: 2 view checks x-ray shows low lung volumes with bibasilar scarring that is unchanged from previous.  There is no evidence of pneumonia, pneumothorax, or widened mediastinum  Official radiology report(s): DG Chest 2 View  Result Date: 12/29/2020 CLINICAL DATA:  Shortness of breath EXAM: CHEST - 2 VIEW COMPARISON:  09/10/2019 FINDINGS: Low lung volumes, bibasilar scarring, similar to  prior study. No effusions. Heart is normal size. No acute bony abnormality. IMPRESSION: Low lung volumes with bibasilar scarring, stable. No active disease. Electronically Signed   By: Charlett Nose M.D.   On: 12/29/2020 20:36    ____________________________________________   PROCEDURES  Procedure(s) performed (including Critical Care):  .1-3 Lead EKG Interpretation Performed by: Merwyn Katos, MD Authorized by: Merwyn Katos, MD     Interpretation: normal     ECG rate:  80   ECG rate assessment: normal     Rhythm: sinus rhythm     Ectopy: none     Conduction: normal       ____________________________________________   INITIAL IMPRESSION / ASSESSMENT AND PLAN / ED COURSE  As part of my medical decision making, I reviewed the following data within the electronic MEDICAL RECORD NUMBER Nursing notes reviewed and incorporated, Labs reviewed, EKG interpreted, Old chart reviewed, Radiograph reviewed and Notes from prior ED visits reviewed and incorporated        Presentation  most consistent with Viral Syndrome.  Patient has tested positive for COVID-19. At this time patient is not requiring submental oxygenation due to acute hypoxic respiratory failure.  Given History and Exam I have a lower suspicion for: Emergent CardioPulmonary causes [such as Acute Asthma or COPD Exacerbation, acute Heart Failure or exacerbation, PE, PTX, atypical ACS, PNA]. Emergent Otolaryngeal causes [such as PTA, RPA, Ludwigs, Epiglottitis, EBV].  Regarding Emergent Travel or Immunosuppressive related infectious: I have a low suspicion for acute HIV.  Given persistent altered mental status and significant generalized weakness as well as need for further evaluation and management, patient will require admission      ____________________________________________   FINAL CLINICAL IMPRESSION(S) / ED DIAGNOSES  Final diagnoses:  Generalized weakness  Dehydration  COVID-19 virus infection  Altered mental status, unspecified altered mental status type     ED Discharge Orders    None       Note:  This document was prepared using Dragon voice recognition software and may include unintentional dictation errors.   Merwyn Katos, MD 12/30/20 [phone]

## 2020-12-30 NOTE — Evaluation (Signed)
Clinical/Bedside Swallow Evaluation Patient Details  Name: Ethan Alvarez MRN: [id number] Date of Birth: 05/26/1961  Today's Date: 12/30/2020 Time: SLP Start Time (ACUTE ONLY): 0940 SLP Stop Time (ACUTE ONLY): 1040 SLP Time Calculation (min) (ACUTE ONLY): 60 min  Past Medical History:  Past Medical History:  Diagnosis Date  . Hypertension   . Schizophrenia [facility])    Past Surgical History:  Past Surgical History:  Procedure Laterality Date  . INCISION AND DRAINAGE Right 06/02/2019   Procedure: INCISION AND DRAINAGE;  Surgeon: Christena Flake, MD;  Location: [facility] ORS;  Service: Orthopedics;  Laterality: Right;  . QUADRICEPS TENDON REPAIR Right 04/25/2019   Procedure: REPAIR QUADRICEP TENDON;  Surgeon: Christena Flake, MD;  Location: [facility] ORS;  Service: Orthopedics;  Laterality: Right;  . QUADRICEPS TENDON REPAIR Right 06/02/2019   Procedure: REPAIR QUADRICEP TENDON;  Surgeon: Christena Flake, MD;  Location: [facility] ORS;  Service: Orthopedics;  Laterality: Right;   HPI:  Pt is a 60 y.o. 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  He was having mild respiratory distress during triage.  According to the brother, Caregiver for pt, pt has been feeling so weak that he unable to get out of a chair.  He was coughing a little bit that without expectoration and was given Robitussin.  He has dyspnea on exertion and dyspnea at rest but no more than his usual per report.  No wheezing.  He had low-grade fever above 99.  No nausea or vomiting or diarrhea no loss of taste or smell.  He has been stating that he feels exhausted.  He had been vaccinated against COVID-19 with 2 shots.   Assessment / Plan / Recommendation Clinical Impression  Pt appears to present w/ grossly adequate-mild oropharyngeal phase swallow w/  inconsistent pharyngeal phase dysphagia noted c/b mild, delayed cough post MULTIPLE swallows. No overt neuromuscular deficits noted -- unsure if presentation is related to his declined Pulmonary status currently. Pt also has full girth. 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 He has declined insight/awareness and has some monitoring in the home. These factors can increase risk for dysphagia, aspiration. Pt required positioning upright in bed for po's then consumed po trials w/ no immediate, overt, clinical s/s of aspiration. However, pt exhibited a mild, delayed cough when taking Multiple sips at a time. Pt was monitored and cued to take 1-2 sips, drink more slowly. When following this precaution, no further overt coughing noted. Also noted pt exhibited mildly increased WOB/SOB w/ the exertion of po tasks and the Multiple sips -- this could have impacted Apnea timing and airway closure during the swallowing(thus increase risk for laryngeal penetration/aspiration and coughing). Pt appears at reduced risk for aspiration when following general aspiration precautions. During po trials, pt consumed all consistencies w/ no decline in vocal quality, or change sustained change/decline in respiratory presentation during/post trials. Pt was educated on taking a Rest Break b/t small sips to calm any increased RR/breathing. Oral phase appeared grossly [facility] w/ timely bolus management, mashing/gumming, and control of bolus propulsion for A-P transfer for swallowing. Pt is Edentulous. Oral clearing achieved w/ all trial consistencies -- moistened, soft foods given. OM Exam appeared [facility] w/ no unilateral weakness noted. Speech Clear. Pt fed self w/ setup support but noted UE tremors/shakiness --  he required feeding support. Recommend a more Mech Soft diet consistency w/ well-Cut meats, moistened foods d/t Edentulous status; Thin liquids -- carefully monitor straw use, and pt should help to Hold Cup when  drinking taking 1-2 Small Sips at a time. Rest Breaks to reduce WOB/SOB w/ exertion of po tasks. Recommend general aspiration precautions, Pills WHOLE in Puree for safer, easier swallowing. Education given on Pills in Puree; food consistencies and easy to eat options; general aspiration precautions to pt and Dtr. NSG/MD updated. SLP Visit Diagnosis: Dysphagia, oropharyngeal phase (R13.12)    Aspiration Risk  Mild aspiration risk (reduced when following precautions)    Diet Recommendation  Mech Soft foods in diet d/t Edentulous status, moistened; Thin liquids. Recommend Small, 1-2 sips at time taking Rest Breaks in between bites/sips in order to reduce WOB/SOB. General aspiration precautions. Tray setup at meals and assist as needed d/t UE shakiness.   Medication Administration: Whole meds with puree (for safer swallowing)    Other  Recommendations Recommended Consults:  (Dietician f/u) Oral Care Recommendations: Oral care BID;Patient independent with oral care Other Recommendations:  (n/a)   Follow up Recommendations None (TBD)      Frequency and Duration min 1 x/week  1 week       Prognosis Prognosis for Safe Diet Advancement: Fair (-Good) Barriers to Reach Goals: Cognitive deficits;Time post onset;Severity of deficits;Behavior      Swallow Study   General Date of Onset: 12/29/20 HPI: Pt is a 60 y.o. 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  He was having mild respiratory distress during triage.  According to the brother, Caregiver for pt, pt has been feeling so weak that he unable to get out of a chair.  He was coughing a little bit that without expectoration and was given Robitussin.  He has dyspnea on exertion and dyspnea at rest but no more than his usual per report.  No  wheezing.  He had low-grade fever above 99.  No nausea or vomiting or diarrhea no loss of taste or smell.  He has been stating that he feels exhausted.  He had been vaccinated against COVID-19 with 2 shots. Type of Study: Bedside Swallow Evaluation Previous Swallow Assessment: none reported Diet Prior to this Study: Regular;Thin liquids Temperature Spikes Noted:  (febrile; wbc 3.8) Respiratory Status: Room air (had been on O2 [place]?) History of Recent Intubation: No Behavior/Cognition:  (baseline deficits) Oral Cavity Assessment: Within Functional Limits Oral Care Completed by SLP: Yes Oral Cavity - Dentition: Edentulous Vision: Functional for self-feeding Self-Feeding Abilities: Able to feed self;Needs assist;Needs set up;Total assist (shaky UEs) Patient Positioning: Upright in bed (needed support for sitting Upright) Baseline Vocal Quality: Normal Volitional Cough: Strong Volitional Swallow: Able to elicit    Oral/Motor/Sensory Function Overall Oral Motor/Sensory Function: Within functional limits (min oral confusion)   Ice Chips Ice chips: Within functional limits Presentation: Spoon (fed; 2 trials)   Thin Liquid Thin Liquid: Impaired (min) Presentation: Self Fed;Cup;Straw (limited to 2 sips per trial - 5 trials; after initial multiple sips) Oral Phase Impairments:  (none) Oral Phase Functional Implications:  (none) Pharyngeal  Phase Impairments: Cough - Delayed (x2 post multiple sips; when sips not monitored)    Nectar Thick Nectar Thick Liquid: Not tested   Honey Thick Honey Thick Liquid: Not tested  Puree Puree: Within functional limits Presentation: Spoon (fed; 8 trials) Other Comments: pills given in puree also   Solid     Solid: Within functional limits (grossly - Edentulous) Presentation: Spoon (fed; 5 trials) Other Comments: moistened foods        Jerilynn Som, MS, [employer] Speech Language Pathologist Rehab  Services [phone] Ethan Alvarez 12/30/2020,11:30 AM

## 2020-12-30 NOTE — ED Notes (Signed)
Brother and legal guardian, Axxel Gude, updated on pt plan of care via telephone.

## 2020-12-30 NOTE — ED Notes (Addendum)
Pt had difficult time taking PO medication. Pt able to take half of the PO medications. MD made aware.

## 2020-12-31 DIAGNOSIS — R4182 Altered mental status, unspecified: Secondary | ICD-10-CM

## 2020-12-31 DIAGNOSIS — A419 Sepsis, unspecified organism: Secondary | ICD-10-CM

## 2020-12-31 LAB — C-REACTIVE PROTEIN: CRP: 5 mg/dL — ABNORMAL HIGH (ref <1.0)

## 2020-12-31 MED ORDER — LORAZEPAM 1 MG PO TABS: 2.0000 mg | ORAL_TABLET | Freq: Three times a day (TID) | ORAL | 0 refills | Status: DC

## 2020-12-31 NOTE — Evaluation (Signed)
Physical Therapy Evaluation Patient Details Name: Ethan Alvarez MRN: [id number] DOB: 09-05-1961 Today's Date: 12/31/2020   History of Present Illness  Pt is a 60 y/o M admitted on 12/30/20 after presenting to the ED with acute onset of generalized weakness, AMS, & lethargy for a few days.  Pt currently being treated for acute encephalopathy & sepsis 2/2 COVID 19. PMH: HTN, schizophrenia  Clinical Impression  Pt seen for PT evaluation on this date. Pt reports he's doing well physically but having a tough time "mentally" but unable to elaborate, PT attempts to encourage pt. Pt is poor historian so home set up obtained from pt report & chart. Pt reports he was independent without AD for mobility prior to admission. Pt currently requires up to max assist for bed mobility using hospital bed features, demonstrates fair static sitting balance EOB, but is unable to clear buttocks when attempting sit>stand with max assist +1. Pt also demonstrates poor initiation of mobility tasks throughout session. Pt would benefit from STR to maximize independence with functional mobility prior to d/c home. Will continue to see pt acutely to address strength, endurance, balance, bed mobility & transfers.     Follow Up Recommendations SNF    Equipment Recommendations  None recommended by PT (TBD in next venue)    Recommendations for Other Services       Precautions / Restrictions Precautions Precautions: Fall Restrictions Weight Bearing Restrictions: No      Mobility  Bed Mobility Overal bed mobility: Needs Assistance Bed Mobility: Supine to Sit;Sit to Supine           General bed mobility comments: Pt is able to transfer from semi fowler to raising trunk upright in bed with supervision & use of bed rails, but then requires max assist for scooting to EOB as pt does not initiate movement despite max education. Pt is able to transfer sit>supine with bed rails & CGA but requires max/total assist for  positioning himself in center of bed as pt unable to recognize need to do so & unable to coordinate & initiate movement. Pt scoots to [facility] with bed in trendelenburg position, max assist, max cuing for technique & extra time.    Transfers Overall transfer level: Needs assistance               General transfer comment: Pt attempts sit>stand EOB but unable to clear buttocks with max assist +1, pt with decreased movement/initiation of transfer.  Ambulation/Gait                Stairs            Wheelchair Mobility    Modified Rankin (Stroke Patients Only)       Balance Overall balance assessment: Needs assistance Sitting-balance support: Bilateral upper extremity supported;Feet supported Sitting balance-Leahy Scale: Fair Sitting balance - Comments: Pt is able to sit EOB with supervision without LOB noted                                     Pertinent Vitals/Pain Pain Assessment: No/denies pain    Home Living Family/patient expects to be discharged to:: Private residence Living Arrangements: Alone Available Help at Discharge: Available PRN/intermittently;Family (Pt reports he has a housekeeper of sorts) Type of Home: House Home Access: Level entry       Home Equipment:  (Pt reports he does not have any DME but pt poor historian) Additional Comments:  Pt is a poor historian but per chart & per what pt attempts to explain it seems he lives in the basement of his brother & sister's home with a level entry.    Prior Function Level of Independence: Needs assistance   Gait / Transfers Assistance Needed: Pt reports he's independent with gait without AD.  ADL's / Homemaking Assistance Needed: Pt reports his brother assists him with meal prep. Pt reports having a housekeeper of sorts.        Hand Dominance        Extremity/Trunk Assessment   Upper Extremity Assessment Upper Extremity Assessment: Generalized weakness (Pt with decreased muscle  mass in BUE forearms)    Lower Extremity Assessment Lower Extremity Assessment: Generalized weakness (Not formally MMT. Pt with genu varus, appears to have limited dorsiflexion AROM BLE.)    Cervical / Trunk Assessment Cervical / Trunk Assessment: Kyphotic  Communication      Cognition Arousal/Alertness: Awake/alert Behavior During Therapy: WFL for tasks assessed/performed Overall Cognitive Status: No family/caregiver present to determine baseline cognitive functioning (Pt with difficulty describing PLOF & home set up. Pt with impaired intellectual awareness but is oriented to self & location [facility] hospital in [place]).)                                 General Comments: Extra time to inconsistently follow one step commands throughout session, decreased initiation of mobility tasks.      General Comments      Exercises General Exercises - Lower Extremity Long Arc Quad: AROM;10 reps;Right;Left;Both;Seated Hip Flexion/Marching: AROM;10 reps;Right;Left;Both;Seated (minimal ROM, cuing for increased lifting BLE)   Assessment/Plan    PT Assessment Patient needs continued PT services  PT Problem List Decreased strength;Decreased balance;Decreased cognition;Decreased range of motion;Decreased mobility;Decreased knowledge of use of DME;Cardiopulmonary status limiting activity;Obesity;Decreased safety awareness;Decreased activity tolerance;Decreased coordination       PT Treatment Interventions DME instruction;Functional mobility training;Balance training;Patient/family education;Gait training;Therapeutic activities;Neuromuscular re-education;Stair training;Therapeutic exercise;Manual techniques    PT Goals (Current goals can be found in the Care Plan section)  Acute Rehab PT Goals Patient Stated Goal: get better PT Goal Formulation: With patient Time For Goal Achievement: 01/14/21 Potential to Achieve Goals: Good    Frequency Min 2X/week   Barriers to discharge  Decreased caregiver support unsure if family can provide 24 hr physical assist at d/c    Co-evaluation               AM-PAC PT "6 Clicks" Mobility  Outcome Measure Help needed turning from your back to your side while in a flat bed without using bedrails?: A Lot Help needed moving from lying on your back to sitting on the side of a flat bed without using bedrails?: A Lot Help needed moving to and from a bed to a chair (including a wheelchair)?: Total Help needed standing up from a chair using your arms (e.g., wheelchair or bedside chair)?: Total Help needed to walk in hospital room?: Total Help needed climbing 3-5 steps with a railing? : Total 6 Click Score: 8    End of Session Equipment Utilized During Treatment: Gait belt Activity Tolerance: Patient tolerated treatment well Patient left: in bed;with bed alarm set;with call bell/phone within reach Nurse Communication: Mobility status PT Visit Diagnosis: Muscle weakness (generalized) (M62.81);Difficulty in walking, not elsewhere classified (R26.2)    Time: 9563-8756 PT Time Calculation (min) (ACUTE ONLY): 20 min   Charges:   PT Evaluation $  PT Eval Low Complexity: 1 Low PT Treatments $Therapeutic Activity: 8-22 mins        Aleda Grana, PT, DPT 12/31/20, 10:58 AM   Sandi Mariscal 12/31/2020, 10:56 AM

## 2020-12-31 NOTE — Discharge Instructions (Signed)
Patient to follow-up with [facility] psychiatry on scheduled appointment.

## 2020-12-31 NOTE — Evaluation (Signed)
Occupational Therapy Evaluation Patient Details Name: Ethan Alvarez MRN: [id number] DOB: 04-11-1961 Today's Date: 12/31/2020    History of Present Illness Pt is a 60 y/o M admitted on 12/30/20 after presenting to the ED with acute onset of generalized weakness, AMS, & lethargy for a few days.  Pt currently being treated for acute encephalopathy & sepsis 2/2 COVID 19. PMH: HTN, schizophrenia   Clinical Impression   Ethan Alvarez presents today with generalized weakness and reduced endurance. He reports that at home he performs his own dressing, bathing, toileting INDly and that he does not use any AD for ambulation. During today's evaluation, however, he requires MaxA +2 for bed mobility and sit<stand. Pt stated that he wanted to go home and indicated that he was highly anxious about the prospect of going elsewhere. Therapist explained potential need for STR. After extended discussion, Ethan Alvarez concluded it would be acceptable for him to go to [facility], that he had been there before, knew some of the staff there, and thought they were "really nice." Pt will benefit from continued OT while hospitalized, to maximize return to PLOF. Based on pt's performance today, recommend SNF post DC.    Follow Up Recommendations  SNF    Equipment Recommendations       Recommendations for Other Services       Precautions / Restrictions Precautions Precautions: Fall Restrictions Weight Bearing Restrictions: No      Mobility Bed Mobility Overal bed mobility: Needs Assistance Bed Mobility: Supine to Sit;Sit to Supine     Supine to sit: +2 for physical assistance;HOB elevated;Max assist Sit to supine: Mod assist;+2 for physical assistance   General bed mobility comments: MaxA for scooting to EOB. Pt unable to initiate and coordinate movements, unable to follow 1-step directions.    Transfers Overall transfer level: Needs assistance               General transfer comment: Pt requires MaxA +2 for  sit<stand    Balance Overall balance assessment: Needs assistance Sitting-balance support: Bilateral upper extremity supported;Feet supported Sitting balance-Leahy Scale: Fair Sitting balance - Comments: Pt is able to sit EOB with supervision without LOB noted   Standing balance support: Bilateral upper extremity supported Standing balance-Leahy Scale: Poor Standing balance comment: Heavy reliance on UE support, unable to take a step                           ADL either performed or assessed with clinical judgement   ADL Overall ADL's : Needs assistance/impaired     Grooming: Moderate assistance                                       Vision Patient Visual Report: No change from baseline       Perception     Praxis      Pertinent Vitals/Pain Pain Assessment: No/denies pain     Hand Dominance     Extremity/Trunk Assessment Upper Extremity Assessment Upper Extremity Assessment: Generalized weakness (tremors)   Lower Extremity Assessment Lower Extremity Assessment: Generalized weakness   Cervical / Trunk Assessment Cervical / Trunk Assessment: Kyphotic   Communication Communication Communication: No difficulties   Cognition Arousal/Alertness: Awake/alert Behavior During Therapy: WFL for tasks assessed/performed;Anxious  General Comments: Unable to consistently follow 1-stip commands. Requires frequent VCs for initiation and sequencing   General Comments       Exercises Other Exercises Other Exercises: Bed mobility, transfers, grooming, UB dressing. Educ re: POC and DC planning   Shoulder Instructions      Home Living Family/patient expects to be discharged to:: Private residence Living Arrangements: Other relatives Available Help at Discharge: Available PRN/intermittently;Family Type of Home: House Home Access: Level entry                         Additional Comments:  Per pt, he lives with his brother and has someone who comes by the home and provides household help 5 days/wk      Prior Functioning/Environment Level of Independence: Needs assistance  Gait / Transfers Assistance Needed: Pt reports he ambulates without any AD ADL's / Homemaking Assistance Needed: Family and caregiver assist with IADL -- shopping, cooking, laundry. Pt reports he is able to perform ADL independently.            OT Problem List: Decreased strength;Decreased coordination;Decreased knowledge of precautions;Decreased activity tolerance;Decreased cognition;Impaired balance (sitting and/or standing);Decreased safety awareness      OT Treatment/Interventions: Self-care/ADL training;Therapeutic exercise;Patient/family education;Neuromuscular education;Balance training;DME and/or AE instruction;Therapeutic activities    OT Goals(Current goals can be found in the care plan section) Acute Rehab OT Goals Patient Stated Goal: to go home OT Goal Formulation: With patient Time For Goal Achievement: 01/14/21 Potential to Achieve Goals: Good ADL Goals Pt Will Perform Grooming: with min assist (standing or sitting as able) Pt Will Transfer to Toilet: with min assist Pt Will Perform Tub/Shower Transfer: with mod assist  OT Frequency: Min 1X/week   Barriers to D/C:            Co-evaluation              AM-PAC OT "6 Clicks" Daily Activity     Outcome Measure Help from another person eating meals?: A Little Help from another person taking care of personal grooming?: A Little Help from another person toileting, which includes using toliet, bedpan, or urinal?: A Lot Help from another person bathing (including washing, rinsing, drying)?: A Lot Help from another person to put on and taking off regular upper body clothing?: A Little Help from another person to put on and taking off regular lower body clothing?: A Lot 6 Click Score: 15   End of Session Equipment Utilized During  Treatment: Rolling walker  Activity Tolerance: Patient tolerated treatment well Patient left: in bed;with call bell/phone within reach;with nursing/sitter in room;with bed alarm set  OT Visit Diagnosis: Unsteadiness on feet (R26.81);Muscle weakness (generalized) (M62.81);Ataxia, unspecified (R27.0);Other symptoms and signs involving cognitive function                Time: 1500-1539 OT Time Calculation (min): 39 min Charges:  OT General Charges $OT Visit: 1 Visit OT Evaluation $OT Eval Moderate Complexity: 1 Mod OT Treatments $Self Care/Home Management : 38-52 mins  Latina Craver, PhD, MS, OTR/L ascom [phone] 12/31/20, 4:47 PM

## 2020-12-31 NOTE — Progress Notes (Addendum)
Patient ID: Ethan Alvarez, male   DOB: Apr 10, 1961, 60 y.o.   MRN: [id number]  Spoke with patient's brother [name] healthcare power of attorney and discussed discharge planning. [name] wanted patient to come home. Social worker spoke with patient's caregiver who who feels patient is not safe to go home since she is out for quarantine until Monday. Physical therapy/home health will not be arranged till Monday given weekend. Given above [name] has decided patient will go to rehab which was recommended initially by physical therapy and I discussed with him earlier. [name] is now in agreement for rehab. TOC to work on obtaining rehab bed.  Cancel discharge.

## 2020-12-31 NOTE — Discharge Summary (Signed)
[facility] [place] [facility]   PATIENT NAME: Ethan Alvarez    MR#:  [id number]  DATE OF BIRTH:  01/12/1961  DATE OF ADMISSION:  12/30/2020 ADMITTING PHYSICIAN: Hannah Beat, MD  DATE OF DISCHARGE: 12/31/2020  PRIMARY CARE PHYSICIAN: Bernette Redbird, MD    ADMISSION DIAGNOSIS:  Dehydration [E86.0] Generalized weakness [R53.1] Altered mental status, unspecified altered mental status type [R41.82] COVID-19 virus infection [U07.1] Encephalopathy due to COVID-19 virus [U07.1, G93.49]  DISCHARGE DIAGNOSIS:  Generalized weakness COVID 19 infection  Acute metabolic encephalopathy--improved Sepsis POA--resolved SECONDARY DIAGNOSIS:   Past Medical History:  Diagnosis Date  . Hypertension   . Schizophrenia [facility])     HOSPITAL COURSE:   DouglasRhueis a59 y.o.Caucasian malewith a known history of hypertension and schizophrenia, who presented to the ER with acute onset of generalized weakness over the last few days with altered mental status with significant lethargy. According to the brother [name] who gives most of the history he has been feeling so weak that he can get out of chair  Acute encephalopathy and sepsissecondary to COVID-19POA.Sepsis is manifested by tachycardia and tachypnea and wbc <4K -sepsis resolved --patient on IV remdesivir in ER -- medically not exhibiting any respiratory symptoms. d/c steroids and remdesivir - CRP 5.7. Pro calcitonin <0.10 -FDP and D-dimer wnl -Chest CTA was ordered to assess for PE and COVID-19 pneumoniaand came back negative for both. -- Sats are more than 92% on room air --  urine culture pending -no fever, eating well, more alert and pleasant.   Essential hypertension. -continue propranolol.  Chronic Schizophrenia. - continue olanzapine,Cogentin, invega, sertraline,ativan. -- Patient follows with [facility] psychiatry  Depression and anxiety. -continue Zoloft and Ativan as well as  Depakote.  Generalized weakness -- PT -- saw patient and recommends rehab. Discussed with patient's brother [name] regarding rehab option. [name] wants patient going home and familiar environment given his psych issues and intellectual disability. Brother understands patient has not been much active here in the hospital. He told me he will manage with caregiver and physical therapy at home. According to [facility] no DME requirement per [name]. Will discharge patient to home today. Brother in agreement with plan.   DVT prophylaxis. -Subcutaneous Lovenox.  Procedures: Family communication : brother [name] on the phone. Requesting patient be discharged to go home and familiar environment. Consults :noneCODE STATUS: partial DVT Prophylaxis : Lovenox  Status is: Inpatient     Dispo: The patient is from: Home  Anticipated d/c is to: Home with PT  Anticipated d/c date is: today  Patient currently is medically test at baseline for discharge TOC for discharge planning. CONSULTS OBTAINED:    DRUG ALLERGIES:   Allergies  Allergen Reactions  . Haloperidol Lactate Other (See Comments)    Per [facility]; nature of intolerance unclear intolerance Other reaction(s): Other (See Comments) Per [facility]; nature of intolerance unclear     DISCHARGE MEDICATIONS:   Allergies as of 12/31/2020      Reactions   Haloperidol Lactate Other (See Comments)   Per [facility]; nature of intolerance unclear intolerance Other reaction(s): Other (See Comments) Per [facility]; nature of intolerance unclear      Medication List    STOP taking these medications   Invega Sustenna 234 MG/1.5ML Susy injection Generic drug: paliperidone   polyethylene glycol 17 g packet Commonly known as: MIRALAX / GLYCOLAX     TAKE these medications   acetaminophen 325 MG tablet Commonly known as: TYLENOL Take 650 mg by mouth 2 (two) times daily.  ascorbic acid 500 MG tablet Commonly known as: VITAMIN  C Take 250 mg by mouth daily.   benztropine 0.5 MG tablet Commonly known as: COGENTIN Take 0.5 mg by mouth every morning.   benztropine 1 MG tablet Commonly known as: COGENTIN Take 1 mg by mouth 2 (two) times daily.   cholecalciferol 25 MCG (1000 UNIT) tablet Commonly known as: VITAMIN D3 Take 2,000 Units by mouth daily.   divalproex 500 MG DR tablet Commonly known as: DEPAKOTE Take 1,000 mg by mouth 2 (two) times a day.   LORazepam 1 MG tablet Commonly known as: ATIVAN Take 2 tablets (2 mg total) by mouth in the morning, at noon, and at bedtime. Takes at 0800, 1400, 2000   OLANZapine 5 MG tablet Commonly known as: ZYPREXA Take 5 mg by mouth See admin instructions. 5 mg 0800 and 1400   paliperidone 6 MG 24 hr tablet Commonly known as: INVEGA Take 6 mg by mouth daily.   paliperidone 9 MG 24 hr tablet Commonly known as: INVEGA Take 9 mg by mouth at bedtime.   propranolol 20 MG tablet Commonly known as: INDERAL Take 20 mg by mouth 2 (two) times daily.   sertraline 100 MG tablet Commonly known as: ZOLOFT Take 100 mg by mouth 2 (two) times a day.   vitamin B-12 100 MCG tablet Commonly known as: CYANOCOBALAMIN Take 100 mcg by mouth daily.       If you experience worsening of your admission symptoms, develop shortness of breath, life threatening emergency, suicidal or homicidal thoughts you must seek medical attention immediately by calling 911 or calling your MD immediately  if symptoms less severe.  You Must read complete instructions/literature along with all the possible adverse reactions/side effects for all the Medicines you take and that have been prescribed to you. Take any new Medicines after you have completely understood and accept all the possible adverse reactions/side effects.   Please note  You were cared for by a hospitalist during your hospital stay. If you have any questions about your discharge medications or the care you received while you were in  the hospital after you are discharged, you can call the unit and asked to speak with the hospitalist on call if the hospitalist that took care of you is not available. Once you are discharged, your primary care physician will handle any further medical issues. Please note that NO REFILLS for any discharge medications will be authorized once you are discharged, as it is imperative that you return to your primary care physician (or establish a relationship with a primary care physician if you do not have one) for your aftercare needs so that they can reassess your need for medications and monitor your lab values. Today   SUBJECTIVE   More awake alert. Eat good breakfast. No fever.  VITAL SIGNS:  Blood pressure 122/68, pulse 86, temperature 98.4 F (36.9 C), resp. rate 17, height 5\' 8"  (1.727 m), weight 113.4 kg, SpO2 99 %.  I/O:    Intake/Output Summary (Last 24 hours) at 12/31/2020 1342 Last data filed at 12/31/2020 0740 Gross per 24 hour  Intake --  Output 600 ml  Net -600 ml    PHYSICAL EXAMINATION:  GENERAL:  60 y.o.-year-old patient lying in the bed with no acute distress. obese LUNGS: Normal breath sounds bilaterally, no wheezing, rales,rhonchi or crepitation. No use of accessory muscles of respiration.  CARDIOVASCULAR: S1, S2 normal. No murmurs, rubs, or gallops.  ABDOMEN: Soft, non-tender, non-distended. Bowel sounds present.  No organomegaly or mass.  EXTREMITIES: No pedal edema, cyanosis, or clubbing.  NEUROLOGIC: grossly nonfocal. Moves all extremities well.  PSYCHIATRIC: The patient is alert and awake. Pleasant SKIN: No obvious rash, lesion, or ulcer.   DATA REVIEW:   CBC  Recent Labs  Lab 12/29/20 1951  WBC 3.8*  HGB 16.1  HCT 45.0  PLT 86*    Chemistries  Recent Labs  Lab 12/29/20 1951  NA 134*  K 4.3  CL 96*  CO2 26  GLUCOSE 112*  BUN 14  CREATININE 0.74  CALCIUM 8.7*  AST 36  ALT 45*  ALKPHOS 46  BILITOT 0.9    Microbiology Results   Recent  Results (from the past 240 hour(s))  Resp Panel by RT-PCR (Flu A&B, Covid) Nasopharyngeal Swab     Status: Abnormal   Collection Time: 12/30/20  2:12 AM   Specimen: Nasopharyngeal Swab; Nasopharyngeal(NP) swabs in vial transport medium  Result Value Ref Range Status   SARS Coronavirus 2 by RT PCR POSITIVE (A) NEGATIVE Final    Comment: RESULT CALLED TO, READ BACK BY AND VERIFIED WITH: Kathyrn Sheriff RN [phone] 12/30/20 HNM (NOTE) SARS-CoV-2 target nucleic acids are DETECTED.  The SARS-CoV-2 RNA is generally detectable in upper respiratory specimens during the acute phase of infection. Positive results are indicative of the presence of the identified virus, but do not rule out bacterial infection or co-infection with other pathogens not detected by the test. Clinical correlation with patient history and other diagnostic information is necessary to determine patient infection status. The expected result is Negative.  Fact Sheet for Patients: BloggerCourse.com  Fact Sheet for Healthcare Providers: SeriousBroker.it  This test is not yet approved or cleared by the [place] FDA and  has been authorized for detection and/or diagnosis of SARS-CoV-2 by FDA under an Emergency Use Authorization (EUA).  This EUA will remain in effect (meaning this test can be  used) for the duration of  the COVID-19 declaration under Section 564(b)(1) of the Act, 21 U.S.C. section 360bbb-3(b)(1), unless the authorization is terminated or revoked sooner.     Influenza A by PCR NEGATIVE NEGATIVE Final   Influenza B by PCR NEGATIVE NEGATIVE Final    Comment: (NOTE) The Xpert Xpress SARS-CoV-2/FLU/RSV plus assay is intended as an aid in the diagnosis of influenza from Nasopharyngeal swab specimens and should not be used as a sole basis for treatment. Nasal washings and aspirates are unacceptable for Xpert Xpress SARS-CoV-2/FLU/RSV testing.  Fact Sheet for  Patients: BloggerCourse.com  Fact Sheet for Healthcare Providers: SeriousBroker.it  This test is not yet approved or cleared by the [place] FDA and has been authorized for detection and/or diagnosis of SARS-CoV-2 by FDA under an Emergency Use Authorization (EUA). This EUA will remain in effect (meaning this test can be used) for the duration of the COVID-19 declaration under Section 564(b)(1) of the Act, 21 U.S.C. section 360bbb-3(b)(1), unless the authorization is terminated or revoked.  Performed at [facility], [address] [place], [place] [address]     RADIOLOGY:  DG Chest 2 View  Result Date: 12/29/2020 CLINICAL DATA:  Shortness of breath EXAM: CHEST - 2 VIEW COMPARISON:  09/10/2019 FINDINGS: Low lung volumes, bibasilar scarring, similar to prior study. No effusions. Heart is normal size. No acute bony abnormality. IMPRESSION: Low lung volumes with bibasilar scarring, stable. No active disease. Electronically Signed   By: Charlett Nose M.D.   On: 12/29/2020 20:36   CT Angio Chest PE W/Cm &/Or Wo Cm  Result Date: 12/30/2020 CLINICAL DATA:  COVID positive, weakness, history of schizophrenia EXAM: CT ANGIOGRAPHY CHEST WITH CONTRAST TECHNIQUE: Multidetector CT imaging of the chest was performed using the standard protocol during bolus administration of intravenous contrast. Multiplanar CT image reconstructions and MIPs were obtained to evaluate the vascular anatomy. CONTRAST:  75mL OMNIPAQUE IOHEXOL 350 MG/ML SOLN COMPARISON:  CT 05/31/2020 FINDINGS: Cardiovascular: Satisfactory opacification of pulmonary arteries. No large central or lobar filling defects are identified. More distal evaluation limited by respiratory motion artifact. Central pulmonary arteries are normal caliber. Heart is borderline enlarged with some right atrioventricular prominence, similar to comparison exam. The aortic root is suboptimally assessed  given cardiac pulsation artifact. The aorta is normal caliber. No acute luminal abnormality of the imaged aorta. No periaortic stranding or hemorrhage. Shared origin of the brachiocephalic and left common carotid arteries. Proximal great vessels are otherwise unremarkable. Mediastinum/Nodes: No mediastinal fluid or gas. Normal thyroid gland and thoracic inlet. No acute abnormality of the trachea or esophagus. No worrisome mediastinal, hilar or axillary adenopathy. Lungs/Pleura: There are extensive atelectatic changes which are likely accentuated by imaging during exhalation. No focal consolidative opacity. No convincing CT features of edema. No pneumothorax. No visible effusion. No concerning pulmonary nodules or masses. Upper Abdomen: Chronic hepatosplenomegaly. No acute abnormalities present in the visualized portions of the upper abdomen. Musculoskeletal: Multilevel degenerative changes are present in the imaged portions of the spine. Multiple Schmorl's nodes with cupping of the superior endplates in the midthoracic spine, grossly unchanged from comparison imaging. No worrisome chest wall masses or lesions. Review of the MIP images confirms the above findings. IMPRESSION: 1. Satisfactory opacification of pulmonary arteries. No large central or lobar filling defects are identified. More distal evaluation limited by respiratory motion artifact. 2. Atelectasis, no other acute intrathoracic process. 3. Chronic hepatosplenomegaly. Electronically Signed   By: Kreg ShropshirePrice  DeHay M.D.   On: 12/30/2020 05:36     CODE STATUS:     Code Status Orders  (From admission, onward)         Start     Ordered   12/30/20 0531  Limited resuscitation (code)  Continuous       Question Answer Comment  In the event of cardiac or respiratory ARREST: Initiate Code Blue, Call Rapid Response Yes   In the event of cardiac or respiratory ARREST: Perform CPR Yes   In the event of cardiac or respiratory ARREST: Perform  Intubation/Mechanical Ventilation No   In the event of cardiac or respiratory ARREST: Use NIPPV/BiPAp only if indicated Yes   In the event of cardiac or respiratory ARREST: Administer ACLS medications if indicated Yes   In the event of cardiac or respiratory ARREST: Perform Defibrillation or Cardioversion if indicated Yes      12/30/20 0531        Code Status History    Date Active Date Inactive Code Status Order ID Comments User Context   12/30/2020 0442 12/30/2020 0531 Full Code [id number]  Hannah BeatMansy, Jan A, MD ED   09/24/2019 2349 09/29/2019 0020 Full Code [id number]  Ward, Layla MawKristen N, DO ED   09/14/2019 1444 09/15/2019 1457 Full Code [id number]  [name], [name], MD ED   06/01/2019 2031 06/09/2019 2011 Full Code [id number]  Milagros LollSudini, Srikar, MD ED   04/25/2019 1826 04/28/2019 2206 Full Code [id number]  Poggi, Excell SeltzerJohn J, MD Inpatient   Advance Care Planning Activity       TOTAL TIME TAKING CARE OF THIS PATIENT: *40* minutes.    Enedina FinnerSona Davelle Anselmi M.D  [facility]    CC: Primary care physician; Bernette Redbird, MD

## 2021-01-01 LAB — PLATELET COUNT: Platelets: 77 10*3/uL — ABNORMAL LOW (ref 150–400)

## 2021-01-01 LAB — C-REACTIVE PROTEIN: CRP: 2.9 mg/dL — ABNORMAL HIGH (ref <1.0)

## 2021-01-01 NOTE — Progress Notes (Signed)
[facility]  - [place] at [facility]   PATIENT NAME: Ethan Alvarez    MR#:  [id number]  DATE OF BIRTH:  1961-11-21  SUBJECTIVE:  No new complaints. Pleasant and calm Worked with PT earlier  REVIEW OF SYSTEMS:   Review of Systems  Constitutional: Negative for chills, fever and weight loss.  HENT: Negative for ear discharge, ear pain and nosebleeds.   Eyes: Negative for blurred vision, pain and discharge.  Respiratory: Negative for sputum production, shortness of breath, wheezing and stridor.   Cardiovascular: Negative for chest pain, palpitations, orthopnea and PND.  Gastrointestinal: Negative for abdominal pain, diarrhea, nausea and vomiting.  Genitourinary: Negative for frequency and urgency.  Musculoskeletal: Negative for back pain and joint pain.  Neurological: Negative for sensory change, speech change, focal weakness and weakness.  Psychiatric/Behavioral: Negative for depression and hallucinations. The patient is not nervous/anxious.    Tolerating Diet:yes Tolerating PT: Rehab  DRUG ALLERGIES:   Allergies  Allergen Reactions  . Haloperidol Lactate Other (See Comments)    Per [facility]; nature of intolerance unclear intolerance Other reaction(s): Other (See Comments) Per [facility]; nature of intolerance unclear     VITALS:  Blood pressure 113/79, pulse 67, temperature 98.2 F (36.8 C), resp. rate 16, height 5\' 8"  (1.727 m), weight 113.4 kg, SpO2 97 %.  PHYSICAL EXAMINATION:   Physical Exam  GENERAL:  60 y.o.-year-old patient lying in the bed with no acute distress. OBESE HEENT: Head atraumatic, normocephalic. Oropharynx and nasopharynx clear.  LUNGS: Normal breath sounds bilaterally, no wheezing, rales, rhonchi. No use of accessory muscles of respiration.  CARDIOVASCULAR: S1, S2 normal. No murmurs, rubs, or gallops.  ABDOMEN: Soft, nontender, nondistended. Bowel sounds present. No organomegaly or mass.  EXTREMITIES: No cyanosis, clubbing or edema b/l.     NEUROLOGIC: Cranial nerves II through XII are intact. No focal Motor or sensory deficits b/l.   PSYCHIATRIC:  patient is alert and awake. pleasant.  SKIN: No obvious rash, lesion, or ulcer.   LABORATORY PANEL:  CBC Recent Labs  Lab 12/29/20 1951 01/01/21 0629  WBC 3.8*  --   HGB 16.1  --   HCT 45.0  --   PLT 86* 77*    Chemistries  Recent Labs  Lab 12/29/20 1951  NA 134*  K 4.3  CL 96*  CO2 26  GLUCOSE 112*  BUN 14  CREATININE 0.74  CALCIUM 8.7*  AST 36  ALT 45*  ALKPHOS 46  BILITOT 0.9   Cardiac Enzymes No results for input(s): TROPONINI in the last 168 hours. RADIOLOGY:  No results found. ASSESSMENT AND PLAN:  Ethan Alvarez a59 y.o.Caucasian malewith a known history of hypertension and schizophrenia, who presented to the ER with acute onset of generalized weakness over the last few days with altered mental status with significant lethargy. According to the brotherBArrywho gives most of the history he has been feeling so weak that he can get out of chair  Acute encephalopathy and sepsissecondary to COVID-19POA.Sepsis is manifested by tachycardia and tachypneaand wbc <4K -sepsis resolved --patient on IV remdesivir in ER --medically not exhibiting any respiratory symptoms.d/c steroids and remdesivir -CRP 5.7. Pro calcitonin <0.10 -FDP and D-dimer wnl -Chest CTA was ordered to assess for PE and COVID-19 pneumoniaand came back negative for both. --Sats are more than 92% on room air -- urine culture pending -no fever, eating well, more alert and pleasant.  Essential hypertension. -continue propranolol.  ChronicSchizophrenia. -continue olanzapine,Cogentin, invega, sertraline,ativan. --Patient [facility] with [facility] psychiatry  Depression and anxiety. -continue  Zoloft and Ativan as well as Depakote.  Generalized weakness --PT -recommends rehab. TOC d/w pt's care giver at  Home and Brother Ethan Alvarez--now in agreement fro  rehab  DVT prophylaxis. -Subcutaneous Lovenox.  Procedures: Family communication:brother Ethan Alvarez on the phone.penidng rehab bed Consults:none CODE STATUS:partial DVT Prophylaxis:Lovenox  Status is: Inpatient     Dispo: The patient is from:Home Anticipated d/c is HV:FMBBU Anticipated d/c date YZ:JQDU bed available Patient currently is medically best at baseline for dischargeto rehab TOC for discharge planning.          TOTAL TIME TAKING CARE OF THIS PATIENT: *35* minutes.  >50% time spent on counselling and coordination of care  Note: This dictation was prepared with Dragon dictation along with smaller phrase technology. Any transcriptional errors that result from this process are unintentional.  Enedina Finner M.D    [facility]   CC: Primary care physician; Bernette Redbird, MDPatient ID: Ethan Alvarez, male   DOB: Mar 21, 1961, 60 y.o.   MRN: [id number]

## 2021-01-01 NOTE — TOC Initial Note (Signed)
Transition of Care [facility]) - Initial/Assessment Note    Patient Details  Name: Ethan Alvarez MRN: [id number] Date of Birth: 1961-01-15  Transition of Care [facility]) CM/SW Contact:    Maud Deed, LCSW Phone Number: 01/01/2021, 3:11 PM  Clinical Narrative:                 CSW spoke with Pt's family regarding discharge plans. Pt was mobile with limited assistance prior to hospitalization. Family prefers for pt to come home with [facility] but understand that SNF may be a better option at this time. Gunnar Fusi is pt's daytime caregiver and can be reached at [phone]  and pt's brother [name] is his Legal guardian. If pt has to go to SNF family prefers [facility] since he has been there before.   TOC will continue to follow.   Expected Discharge Plan: Skilled Nursing Facility Barriers to Discharge: SNF Pending bed offer,Continued Medical Work up   Patient Goals and CMS Choice Patient states their goals for this hospitalization and ongoing recovery are:: Bother states that he would like for pt to come home but if SNF is the best option he is ok with that   Choice offered to / list presented to : Sibling  Expected Discharge Plan and Services Expected Discharge Plan: Skilled Nursing Facility     Post Acute Care Choice: Skilled Nursing Facility Living arrangements for the past 2 months: Single Family Home Expected Discharge Date: 12/31/20                                    Prior Living Arrangements/Services Living arrangements for the past 2 months: Single Family Home Lives with:: Siblings Patient language and need for interpreter reviewed:: Yes        Need for Family Participation in Patient Care: Yes (Comment) Care giver support system in place?: Yes (comment) Current home services: Homehealth aide Criminal Activity/Legal Involvement Pertinent to Current Situation/Hospitalization: No - Comment as needed  Activities of Daily Living Home Assistive Devices/Equipment:  None ADL Screening (condition at time of admission) Patient's cognitive ability adequate to safely complete daily activities?: Yes Is the patient deaf or have difficulty hearing?: No Does the patient have difficulty seeing, even when wearing glasses/contacts?: No Does the patient have difficulty concentrating, remembering, or making decisions?: No Patient able to express need for assistance with ADLs?: Yes Does the patient have difficulty dressing or bathing?: No Independently performs ADLs?: Yes (appropriate for developmental age) Does the patient have difficulty walking or climbing stairs?: Yes Weakness of Legs: Right Weakness of Arms/Hands: None  Permission Sought/Granted Permission sought to share information with : Facility Medical sales representative    Share Information with NAME: Fard Borunda     Permission granted to share info w Relationship: Brother  Permission granted to share info w Contact Information: [phone]  Emotional Assessment Appearance:: Other (Comment Required (unable to assess) Attitude/Demeanor/Rapport: Unable to Assess Affect (typically observed): Unable to Assess Orientation: : Oriented to Self,Oriented to Place,Oriented to  Time,Oriented to Situation Alcohol / Substance Use: Not Applicable Psych Involvement: No (comment)  Admission diagnosis:  Dehydration [E86.0] Generalized weakness [R53.1] Altered mental status, unspecified altered mental status type [R41.82] COVID-19 virus infection [U07.1] Encephalopathy due to COVID-19 virus [U07.1, G93.49] Patient Active Problem List   Diagnosis Date Noted  . Altered mental status   . Sepsis without acute organ dysfunction (HCC)   . COVID-19 virus infection 12/30/2020  .  Generalized weakness   . Schizophrenia (HCC)   . Aggressive behavior of adult 09/25/2019  . Suicidal ideation 09/25/2019  . Fall 09/14/2019  . MSSA bacteremia 07/09/2019  . Septic arthritis (HCC) 07/09/2019  . Cellulitis of right knee  06/01/2019  . Rupture of right quadriceps tendon 04/25/2019   PCP:  Bernette Redbird, MD Pharmacy:   [facility] [place], [place] - [address] [place] [address] Phone: [phone] Fax: [phone]     Social Determinants of Health (SDOH) Interventions    Readmission Risk Interventions Readmission Risk Prevention Plan 04/26/2019  Medication Screening Complete  Transportation Screening Complete  Some recent data might be hidden

## 2021-01-02 DIAGNOSIS — E860 Dehydration: Secondary | ICD-10-CM

## 2021-01-02 LAB — CREATININE, SERUM
Creatinine, Ser: 0.88 mg/dL (ref 0.61–1.24)
GFR, Estimated: >60 mL/min (ref >60)

## 2021-01-02 LAB — PLATELET COUNT: Platelets: 87 10*3/uL — ABNORMAL LOW (ref 150–400)

## 2021-01-02 NOTE — Care Management Important Message (Signed)
Important Message  Patient Details  Name: Ethan Alvarez MRN: [id number] Date of Birth: Dec 23, 1960   Medicare Important Message Given:  Yes  Reviewed with Legal Guardian who says he is aware and agreeable to plan for discharge.    [name] E [name], LCSW 01/02/2021, 2:12 PM

## 2021-01-02 NOTE — Discharge Summary (Signed)
[facility] - [place] [facility]   PATIENT NAME: Ethan Alvarez    MR#:  [id number]  DATE OF BIRTH:  08/25/1961  DATE OF ADMISSION:  12/30/2020 ADMITTING PHYSICIAN: Hannah BeatJan A Mansy, MD  DATE OF DISCHARGE: 01/02/2021  PRIMARY CARE PHYSICIAN: Ethan Redbirdosenthal, Amy, MD    ADMISSION DIAGNOSIS:  Dehydration [E86.0] Generalized weakness [R53.1] Altered mental status, unspecified altered mental status type [R41.82] COVID-19 virus infection [U07.1] Encephalopathy due to COVID-19 virus [U07.1, G93.49]  DISCHARGE DIAGNOSIS:  Generalized weakness COVID 19 infection  Acute metabolic encephalopathy--improved Sepsis POA--resolved SECONDARY DIAGNOSIS:   Past Medical History:  Diagnosis Date  . Hypertension   . Schizophrenia [facility](HCC)     HOSPITAL COURSE:   DouglasRhueis a60 y.o.Caucasian malewith a known history of hypertension and schizophrenia, who presented to the ER with acute onset of generalized weakness over the last few days with altered mental status with significant lethargy. According to the brother Ethan Alvarez who gives most of the history he has been feeling so weak that he can get out of chair  Acute encephalopathy and sepsissecondary to COVID-19POA.Sepsis is manifested by tachycardia and tachypnea and wbc <4K -sepsis resolved --patient on IV remdesivir in ER -- medically not exhibiting any respiratory symptoms. d/c steroids and remdesivir - CRP 5.7. Pro calcitonin <0.10 -FDP and D-dimer wnl -Chest CTA was ordered to assess for PE and COVID-19 pneumoniaand came back negative for both. -- Sats are more than 92% on room air -no fever, eating well, more alert and pleasant and worked very well with PT today.   Essential hypertension. -continue propranolol.  Chronic Schizophrenia. - continue olanzapine,Cogentin, invega, sertraline,ativan. -- [facility]  Depression and anxiety. -continue Zoloft, Ativan and  Depakote.  Generalized weakness -- 1/17-- patient was reevaluated by physical therapy and he did very well. He got on the bedside commode and took several steps in the room. Patient should be able to go home with home health PT. He has personal caregiver/CNA at home. Brother wishes patient to return home and his family environment given his mental health.   DVT prophylaxis. -Subcutaneous Lovenox.  Procedures: Family communication : brother Ethan Alvarez on the phone.1/16  Consults :noneCODE STATUS: partial DVT Prophylaxis : Lovenox  Status is: Inpatient     Dispo: The patient is from: Home  Anticipated d/c is to: Home with PT  Anticipated d/c date is: today  Patient currently is medically best at baseline for discharge TOC for discharge planning. CONSULTS OBTAINED:    DRUG ALLERGIES:   Allergies  Allergen Reactions  . Haloperidol Lactate Other (See Comments)    Per [facility]; nature of intolerance unclear intolerance Other reaction(s): Other (See Comments) Per [facility]; nature of intolerance unclear     DISCHARGE MEDICATIONS:   Allergies as of 01/02/2021      Reactions   Haloperidol Lactate Other (See Comments)   Per [facility]; nature of intolerance unclear intolerance Other reaction(s): Other (See Comments) Per [facility]; nature of intolerance unclear      Medication List    STOP taking these medications   Invega Sustenna 234 MG/1.5ML Susy injection Generic drug: paliperidone   polyethylene glycol 17 g packet Commonly known as: MIRALAX / GLYCOLAX     TAKE these medications   acetaminophen 325 MG tablet Commonly known as: TYLENOL Take 650 mg by mouth 2 (two) times daily.   ascorbic acid 500 MG tablet Commonly known as: VITAMIN C Take 250 mg by mouth daily.   benztropine 0.5 MG tablet Commonly known as: COGENTIN  Take 0.5 mg by mouth every morning.   benztropine 1 MG tablet Commonly known as: COGENTIN Take 1 mg by mouth 2 (two)  times daily.   cholecalciferol 25 MCG (1000 UNIT) tablet Commonly known as: VITAMIN D3 Take 2,000 Units by mouth daily.   divalproex 500 MG DR tablet Commonly known as: DEPAKOTE Take 1,000 mg by mouth 2 (two) times a day.   LORazepam 1 MG tablet Commonly known as: ATIVAN Take 2 tablets (2 mg total) by mouth in the morning, at noon, and at bedtime. Takes at 0800, 1400, 2000   OLANZapine 5 MG tablet Commonly known as: ZYPREXA Take 5 mg by mouth See admin instructions. 5 mg 0800 and 1400   paliperidone 6 MG 24 hr tablet Commonly known as: INVEGA Take 6 mg by mouth daily.   paliperidone 9 MG 24 hr tablet Commonly known as: INVEGA Take 9 mg by mouth at bedtime.   propranolol 20 MG tablet Commonly known as: INDERAL Take 20 mg by mouth 2 (two) times daily.   sertraline 100 MG tablet Commonly known as: ZOLOFT Take 100 mg by mouth 2 (two) times a day.   vitamin B-12 100 MCG tablet Commonly known as: CYANOCOBALAMIN Take 100 mcg by mouth daily.       If you experience worsening of your admission symptoms, develop shortness of breath, life threatening emergency, suicidal or homicidal thoughts you must seek medical attention immediately by calling 911 or calling your MD immediately  if symptoms less severe.  You Must read complete instructions/literature along with all the possible adverse reactions/side effects for all the Medicines you take and that have been prescribed to you. Take any new Medicines after you have completely understood and accept all the possible adverse reactions/side effects.   Please note  You were cared for by a hospitalist during your hospital stay. If you have any questions about your discharge medications or the care you received while you were in the hospital after you are discharged, you can call the unit and asked to speak with the hospitalist on call if the hospitalist that took care of you is not available. Once you are discharged, your primary care  physician will handle any further medical issues. Please note that NO REFILLS for any discharge medications will be authorized once you are discharged, as it is imperative that you return to your primary care physician (or establish a relationship with a primary care physician if you do not have one) for your aftercare needs so that they can reassess your need for medications and monitor your lab values. Today   SUBJECTIVE   More awake alert. Eat good breakfast. No fever. Worked with PT and did quiet well  VITAL SIGNS:  Blood pressure 129/60, pulse 68, temperature 97.9 F (36.6 C), resp. rate 18, height 5\' 8"  (1.727 m), weight 113.4 kg, SpO2 93 %.  I/O:    Intake/Output Summary (Last 24 hours) at 01/02/2021 1042 Last data filed at 01/01/2021 1504 Gross per 24 hour  Intake -  Output 500 ml  Net -500 ml    PHYSICAL EXAMINATION:  GENERAL:  60 y.o.-year-old patient lying in the bed with no acute distress. obese LUNGS: Normal breath sounds bilaterally, no wheezing, rales,rhonchi or crepitation. No use of accessory muscles of respiration.  CARDIOVASCULAR: S1, S2 normal. No murmurs, rubs, or gallops.  ABDOMEN: Soft, non-tender, non-distended. Bowel sounds present. No organomegaly or mass.  EXTREMITIES: No pedal edema, cyanosis, or clubbing.  NEUROLOGIC: grossly nonfocal. Moves all extremities  well.  PSYCHIATRIC: The patient is alert and awake. Pleasant SKIN: No obvious rash, lesion, or ulcer.   DATA REVIEW:   CBC  Recent Labs  Lab 12/29/20 1951 01/01/21 0629 01/02/21 0413  WBC 3.8*  --   --   HGB 16.1  --   --   HCT 45.0  --   --   PLT 86*   < > 87*   < > = values in this interval not displayed.    Chemistries  Recent Labs  Lab 12/29/20 1951 01/02/21 0413  NA 134*  --   K 4.3  --   CL 96*  --   CO2 26  --   GLUCOSE 112*  --   BUN 14  --   CREATININE 0.74 0.88  CALCIUM 8.7*  --   AST 36  --   ALT 45*  --   ALKPHOS 46  --   BILITOT 0.9  --     Microbiology  Results   Recent Results (from the past 240 hour(s))  Resp Panel by RT-PCR (Flu A&B, Covid) Nasopharyngeal Swab     Status: Abnormal   Collection Time: 12/30/20  2:12 AM   Specimen: Nasopharyngeal Swab; Nasopharyngeal(NP) swabs in vial transport medium  Result Value Ref Range Status   SARS Coronavirus 2 by RT PCR POSITIVE (A) NEGATIVE Final    Comment: RESULT CALLED TO, READ BACK BY AND VERIFIED WITH: Kathyrn Sheriff RN [id number] 12/30/20 HNM (NOTE) SARS-CoV-2 target nucleic acids are DETECTED.  The SARS-CoV-2 RNA is generally detectable in upper respiratory specimens during the acute phase of infection. Positive results are indicative of the presence of the identified virus, but do not rule out bacterial infection or co-infection with other pathogens not detected by the test. Clinical correlation with patient history and other diagnostic information is necessary to determine patient infection status. The expected result is Negative.  Fact Sheet for Patients: BloggerCourse.com  Fact Sheet for Healthcare Providers: SeriousBroker.it  This test is not yet approved or cleared by the [place] FDA and  has been authorized for detection and/or diagnosis of SARS-CoV-2 by FDA under an Emergency Use Authorization (EUA).  This EUA will remain in effect (meaning this test can be  used) for the duration of  the COVID-19 declaration under Section 564(b)(1) of the Act, 21 U.S.C. section 360bbb-3(b)(1), unless the authorization is terminated or revoked sooner.     Influenza A by PCR NEGATIVE NEGATIVE Final   Influenza B by PCR NEGATIVE NEGATIVE Final    Comment: (NOTE) The Xpert Xpress SARS-CoV-2/FLU/RSV plus assay is intended as an aid in the diagnosis of influenza from Nasopharyngeal swab specimens and should not be used as a sole basis for treatment. Nasal washings and aspirates are unacceptable for Xpert Xpress  SARS-CoV-2/FLU/RSV testing.  Fact Sheet for Patients: BloggerCourse.com  Fact Sheet for Healthcare Providers: SeriousBroker.it  This test is not yet approved or cleared by the [place] FDA and has been authorized for detection and/or diagnosis of SARS-CoV-2 by FDA under an Emergency Use Authorization (EUA). This EUA will remain in effect (meaning this test can be used) for the duration of the COVID-19 declaration under Section 564(b)(1) of the Act, 21 U.S.C. section 360bbb-3(b)(1), unless the authorization is terminated or revoked.  Performed at [facility], [address] [place], [place] [address]     RADIOLOGY:  No results found.   CODE STATUS:     Code Status Orders  (From admission, onward)  Start     Ordered   12/30/20 0531  Limited resuscitation (code)  Continuous       Question Answer Comment  In the event of cardiac or respiratory ARREST: Initiate Code Blue, Call Rapid Response Yes   In the event of cardiac or respiratory ARREST: Perform CPR Yes   In the event of cardiac or respiratory ARREST: Perform Intubation/Mechanical Ventilation No   In the event of cardiac or respiratory ARREST: Use NIPPV/BiPAp only if indicated Yes   In the event of cardiac or respiratory ARREST: Administer ACLS medications if indicated Yes   In the event of cardiac or respiratory ARREST: Perform Defibrillation or Cardioversion if indicated Yes      12/30/20 0531        Code Status History    Date Active Date Inactive Code Status Order ID Comments User Context   12/30/2020 0442 12/30/2020 0531 Full Code [id number]  Hannah Beat, MD ED   09/24/2019 2349 09/29/2019 0020 Full Code [id number]  Ward, Layla Maw, DO ED   09/14/2019 1444 09/15/2019 1457 Full Code [id number]  Jama Flavors, MD ED   06/01/2019 2031 06/09/2019 2011 Full Code [id number]  Milagros Loll, MD ED   04/25/2019 1826 04/28/2019 2206 Full Code [id number]  Poggi,  Excell Seltzer, MD Inpatient   Advance Care Planning Activity       TOTAL TIME TAKING CARE OF THIS PATIENT: *40* minutes.    Enedina Finner M.D  [facility]    CC: Primary care physician; Ethan Redbird, MD

## 2021-01-02 NOTE — Progress Notes (Addendum)
Physical Therapy Treatment Patient Details Name: Ethan Alvarez MRN: [id number] DOB: 1961/06/09 Today's Date: 01/02/2021    History of Present Illness Pt is a 60 y/o M admitted on 12/30/20 after presenting to the ED with acute onset of generalized weakness, AMS, & lethargy for a few days.  Pt currently being treated for acute encephalopathy & sepsis 2/2 COVID 19. PMH: HTN, schizophrenia    PT Comments    Patient making progress towards meeting PT goals. Alerted by LCSW that patient may be able to go home with caregiver support depending on current mobility status. Patient cooperative and eager to participate with PT. Patient with increased independence with all mobility this session compared to yesterday. Patient needs Min A for bed mobility with extra time required to complete tasks. Min A for sit to stand transfer from bed and from bed side commode with cues for hand placement using rolling walker. Patient able to take several small steps from bed side commode to bed with minimal assistance and cues for LE and rolling walker sequencing. Patient able to stand for ~ 1.5 minutes using rolling walker for support with stand by assistance while therapist performed hygiene tasks after toileting. If patient has caregiver support with mobility, recommend home with caregiver support and HHPT if possible. If patient does not have support, he will need SNF placement as he is unsafe to discharge home without adequate caregiver support. Discussed mobility status with Dr Allena Katz as well.   Follow Up Recommendations  Home health PT;Supervision/Assistance - 24 hour;Supervision for mobility/OOB     Equipment Recommendations  None recommended by PT    Recommendations for Other Services       Precautions / Restrictions Precautions Precautions: Fall Restrictions Weight Bearing Restrictions: No    Mobility  Bed Mobility Overal bed mobility: Needs Assistance Bed Mobility: Supine to Sit;Sit to Supine      Supine to sit: Min assist Sit to supine: Min assist   General bed mobility comments: assistance for LE support. verbal cues for technique. extra time required to complete tasks  Transfers Overall transfer level: Needs assistance   Transfers: Sit to/from Stand Sit to Stand: Min assist         General transfer comment: lifting assistance provided with standing from bed and from bed side commode  Ambulation/Gait Ambulation/Gait assistance: Min assist Gait Distance (Feet): 2 Feet Assistive device: Rolling walker (2 wheeled) Gait Pattern/deviations: Narrow base of support Gait velocity: decreased   General Gait Details: steadying assistance provided with rolling walker. verbal cues for sequencing of BLE and rolling walker   Stairs             Wheelchair Mobility    Modified Rankin (Stroke Patients Only)       Balance Overall balance assessment: Needs assistance Sitting-balance support: Bilateral upper extremity supported;Feet supported Sitting balance-Leahy Scale: Good Sitting balance - Comments: patient maintained sitting balance without UE support   Standing balance support: Bilateral upper extremity supported Standing balance-Leahy Scale: Fair Standing balance comment: patient relying heavily on rolling walker for UE support with no loss of balance noted.                            Cognition Arousal/Alertness: Awake/alert Behavior During Therapy: WFL for tasks assessed/performed;Anxious Overall Cognitive Status: No family/caregiver present to determine baseline cognitive functioning  General Comments: patient is able to follow commands without difficulty      Exercises General Exercises - Lower Extremity Ankle Circles/Pumps: AROM;Strengthening;10 reps;Supine;Both Heel Slides: AAROM;Strengthening;Both;10 reps;Supine Hip ABduction/ADduction: AAROM;Strengthening;Both;10 reps;Supine Straight Leg  Raises: AAROM;Strengthening;Both;10 reps;Supine Other Exercises Other Exercises: verbal cues for sequencing and technique    General Comments        Pertinent Vitals/Pain Pain Assessment: No/denies pain    Home Living                      Prior Function            PT Goals (current goals can now be found in the care plan section) Acute Rehab PT Goals Patient Stated Goal: to go home PT Goal Formulation: With patient Time For Goal Achievement: 01/14/21 Potential to Achieve Goals: Good Progress towards PT goals: Progressing toward goals    Frequency    Min 2X/week      PT Plan Discharge plan needs to be updated    Co-evaluation              AM-PAC PT "6 Clicks" Mobility   Outcome Measure  Help needed turning from your back to your side while in a flat bed without using bedrails?: A Lot Help needed moving from lying on your back to sitting on the side of a flat bed without using bedrails?: A Lot Help needed moving to and from a bed to a chair (including a wheelchair)?: A Lot Help needed standing up from a chair using your arms (e.g., wheelchair or bedside chair)?: A Lot Help needed to walk in hospital room?: A Lot Help needed climbing 3-5 steps with a railing? : Total 6 Click Score: 11    End of Session Equipment Utilized During Treatment: Gait belt Activity Tolerance: Patient tolerated treatment well Patient left: in bed;with call bell/phone within reach;with bed alarm set Nurse Communication:  (updated MD about mobility status) PT Visit Diagnosis: Muscle weakness (generalized) (M62.81);Difficulty in walking, not elsewhere classified (R26.2)     Time: 4665-9935 PT Time Calculation (min) (ACUTE ONLY): 42 min  Charges:  $Therapeutic Exercise: 8-22 mins $Therapeutic Activity: 23-37 mins                     Donna Bernard, PT, MPT   Ina Homes 01/02/2021, 2:50 PM

## 2021-01-02 NOTE — Progress Notes (Signed)
  Speech Language Pathology Treatment: Dysphagia  Patient Details Name: Ethan Alvarez MRN: [id number] DOB: [birth year] Today's Date: 01/02/2021 Time: 0950-1020 SLP Time Calculation (min) (ACUTE ONLY): 30 min  Assessment / Plan / Recommendation Clinical Impression  Pt seen for ongoing toleration of diet and education w/ general aspiration precautions. Pt is tolerating his oral diet well per report, pt report. He appears alert and oriented today; ready to go home and asking when his brother will be here. He is verbally responsive and able to follow instructions w/ min cues. Pt is on RA; wbc wnl.  Pt explained general aspiration precautions and agreed verbally to the need for following them especially sitting upright for all oral intake and taking Small bites/sips Slowly. Pt assisted w/ positioning d/t weakness then given trials of thin liquids and soft solids. No overt clinical s/s of aspiration were noted w/ any consistency; respiratory status remained calm and unlabored, vocal quality clear b/t trials. Pt held Cup when drinking from Straw following instructions for single, small sips slowly w/ gentle reminders. Oral phase appeared grossly [facility] for bolus management and timely A-P transfer for swallowing; oral clearing achieved w/ all consistencies.  Recommend a Regular consistency diet w/ Cut meats, gravies added to moisten foods; Thin liquids. Recommend general aspiration precautions; Pills Whole in Puree as needed for easier swallowing; tray setup and positioning assistance for meals. ST services will sign off at this time w/ NSG to reconsult if needed while admitted. NSG updated. Precautions left at bedside.     HPI HPI: Pt is a 60 y.o. 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  He was having mild  respiratory distress during triage.  According to the brother, Caregiver for pt, pt has been feeling so weak that he unable to get out of a chair.  He was coughing a little bit that without expectoration and was given Robitussin.  He has dyspnea on exertion and dyspnea at rest but no more than his usual per report.  No wheezing.  He had low-grade fever above 99.  No nausea or vomiting or diarrhea no loss of taste or smell.  He has been stating that he feels exhausted.  He had been vaccinated against COVID-19 with 2 shots.      SLP Plan  All goals met       Recommendations  Diet recommendations: Regular;Thin liquid (cut meats well) Liquids provided via: Cup;Straw Medication Administration: Whole meds with puree (as needed for safer swallowing) Supervision: Patient able to self feed Compensations: Minimize environmental distractions;Slow rate;Small sips/bites Postural Changes and/or Swallow Maneuvers: Seated upright 90 degrees;Upright 30-60 min after meal;Out of bed for meals                Oral Care Recommendations: Oral care BID;Patient independent with oral care Follow up Recommendations: None SLP Visit Diagnosis: Dysphagia, unspecified (R13.10) Plan: All goals met       GO                  Orinda Kenner, MS, CCC-SLP Speech Language Pathologist Rehab Services [phone] [facility] 01/02/2021, 11:11 AM

## 2021-01-02 NOTE — TOC Progression Note (Addendum)
Transition of Care [facility]) - Progression Note    Patient Details  Name: Ethan Alvarez MRN: [id number] Date of Birth: 12-08-1961  Transition [facility]) CM/SW Contact  Liliana Cline, LCSW Phone Number: 01/02/2021, 9:07 AM  Clinical Narrative:   CSW called patient's legal guardian/brother [name] to follow up on discharge planning. [name] reported if patient is immobile and unable to help with transfers "at all" then they would have to pursue SNF rehab. If patient is able to help with transfers, they prefer home with home health. Patient has a CNA who cares for him in his home. Explained that if patient needs SNF, we would have to try to find a COVID bed which are not in [facility]. [name] verbalized understanding. CSW updated MD and PT. PT to see patient today and let team know about patient's mobility.  10:45- PT reported patient's mobility has improved and able to take a few steps with walker and transfer with help from PT. Updated patient's brother [name] who is agreeable to patient returning home with home health. He prefers [facility], unable to reach [facility] Representatives. Reaching out to other agencies for [facility] coverage. [name] reported patient lives in a basement apartment and would have to go down a flight of stairs to get in, spoke to PT and MD who reported patient will need EMS for this. CSW will call EMS once Home Health is arranged.  12:30- [facility] only agency able to accept patient at this time, start of care would be next week due to staffing/weather. Updated MD and guardian who are ok with this. Attempted to call for transport, not able to obtain nonemergent transport today. [facility] and [facility] not running due to weather. [facility] unable to transport due to weight/going down flight of stairs. Will try again tomorrow.   Expected Discharge Plan: Skilled Nursing Facility Barriers to Discharge: SNF Pending bed offer,Continued Medical Work up  Expected Discharge  Plan and Services Expected Discharge Plan: Skilled Nursing Facility     Post Acute Care Choice: Skilled Nursing Facility Living arrangements for the past 2 months: Single Family Home Expected Discharge Date: 12/31/20                                     Social Determinants of Health (SDOH) Interventions    Readmission Risk Interventions Readmission Risk Prevention Plan 04/26/2019  Medication Screening Complete  Transportation Screening Complete  Some recent data might be hidden

## 2021-01-03 DIAGNOSIS — E860 Dehydration: Secondary | ICD-10-CM

## 2021-01-03 NOTE — Plan of Care (Signed)
Problem: Education: Goal: Knowledge of General Education information will improve Description: Including pain rating scale, medication(s)/side effects and non-pharmacologic comfort measures 01/03/2021 1216 by Felecia Jan, LPN Outcome: Adequate for Discharge 01/03/2021 1216 by Felecia Jan, LPN Outcome: Adequate for Discharge 01/03/2021 1216 by Felecia Jan, LPN Outcome: Adequate for Discharge   Problem: Health Behavior/Discharge Planning: Goal: Ability to manage health-related needs will improve 01/03/2021 1216 by Felecia Jan, LPN Outcome: Adequate for Discharge 01/03/2021 1216 by Felecia Jan, LPN Outcome: Adequate for Discharge 01/03/2021 1216 by Felecia Jan, LPN Outcome: Adequate for Discharge   Problem: Clinical Measurements: Goal: Ability to maintain clinical measurements within normal limits will improve 01/03/2021 1216 by Felecia Jan, LPN Outcome: Adequate for Discharge 01/03/2021 1216 by Felecia Jan, LPN Outcome: Adequate for Discharge 01/03/2021 1216 by Felecia Jan, LPN Outcome: Adequate for Discharge Goal: Will remain free from infection 01/03/2021 1216 by Felecia Jan, LPN Outcome: Adequate for Discharge 01/03/2021 1216 by Felecia Jan, LPN Outcome: Adequate for Discharge 01/03/2021 1216 by Felecia Jan, LPN Outcome: Adequate for Discharge Goal: Diagnostic test results will improve 01/03/2021 1216 by Felecia Jan, LPN Outcome: Adequate for Discharge 01/03/2021 1216 by Felecia Jan, LPN Outcome: Adequate for Discharge 01/03/2021 1216 by Felecia Jan, LPN Outcome: Adequate for Discharge Goal: Respiratory complications will improve 01/03/2021 1216 by Felecia Jan, LPN Outcome: Adequate for Discharge 01/03/2021 1216 by Felecia Jan, LPN Outcome: Adequate for Discharge 01/03/2021 1216 by Felecia Jan, LPN Outcome: Adequate for Discharge Goal: Cardiovascular complication will be  avoided 01/03/2021 1216 by Felecia Jan, LPN Outcome: Adequate for Discharge 01/03/2021 1216 by Felecia Jan, LPN Outcome: Adequate for Discharge 01/03/2021 1216 by Felecia Jan, LPN Outcome: Adequate for Discharge   Problem: Activity: Goal: Risk for activity intolerance will decrease 01/03/2021 1216 by Felecia Jan, LPN Outcome: Adequate for Discharge 01/03/2021 1216 by Felecia Jan, LPN Outcome: Adequate for Discharge 01/03/2021 1216 by Felecia Jan, LPN Outcome: Adequate for Discharge   Problem: Nutrition: Goal: Adequate nutrition will be maintained 01/03/2021 1216 by Felecia Jan, LPN Outcome: Adequate for Discharge 01/03/2021 1216 by Felecia Jan, LPN Outcome: Adequate for Discharge 01/03/2021 1216 by Felecia Jan, LPN Outcome: Adequate for Discharge   Problem: Coping: Goal: Level of anxiety will decrease 01/03/2021 1216 by Felecia Jan, LPN Outcome: Adequate for Discharge 01/03/2021 1216 by Felecia Jan, LPN Outcome: Adequate for Discharge 01/03/2021 1216 by Felecia Jan, LPN Outcome: Adequate for Discharge   Problem: Elimination: Goal: Will not experience complications related to bowel motility 01/03/2021 1216 by Felecia Jan, LPN Outcome: Adequate for Discharge 01/03/2021 1216 by Felecia Jan, LPN Outcome: Adequate for Discharge 01/03/2021 1216 by Felecia Jan, LPN Outcome: Adequate for Discharge Goal: Will not experience complications related to urinary retention 01/03/2021 1216 by Felecia Jan, LPN Outcome: Adequate for Discharge 01/03/2021 1216 by Felecia Jan, LPN Outcome: Adequate for Discharge 01/03/2021 1216 by Felecia Jan, LPN Outcome: Adequate for Discharge   Problem: Pain Managment: Goal: General experience of comfort will improve 01/03/2021 1216 by Felecia Jan, LPN Outcome: Adequate for Discharge 01/03/2021 1216 by Felecia Jan, LPN Outcome: Adequate for  Discharge 01/03/2021 1216 by Felecia Jan, LPN Outcome: Adequate for Discharge   Problem: Safety: Goal: Ability to remain free from injury will improve 01/03/2021 1216 by Felecia Jan, LPN Outcome: Adequate for Discharge 01/03/2021 1216 by Felecia Jan,  LPN Outcome: Adequate for Discharge 01/03/2021 1216 by Felecia Jan, LPN Outcome: Adequate for Discharge   Problem: Skin Integrity: Goal: Risk for impaired skin integrity will decrease 01/03/2021 1216 by Felecia Jan, LPN Outcome: Adequate for Discharge 01/03/2021 1216 by Felecia Jan, LPN Outcome: Adequate for Discharge 01/03/2021 1216 by Felecia Jan, LPN Outcome: Adequate for Discharge   Problem: Acute Rehab PT Goals(only PT should resolve) Goal: Pt Will Go Supine/Side To Sit Outcome: Adequate for Discharge Goal: Pt Will Transfer Bed To Chair/Chair To Bed Outcome: Adequate for Discharge Goal: Pt Will Ambulate Outcome: Adequate for Discharge   Problem: Acute Rehab OT Goals (only OT should resolve) Goal: Pt. Will Perform Grooming Outcome: Adequate for Discharge Goal: Pt. Will Transfer To Toilet Outcome: Adequate for Discharge Goal: Pt. Will Perform Tub/Shower Transfer Outcome: Adequate for Discharge

## 2021-01-03 NOTE — TOC Transition Note (Addendum)
Transition of Care [facility]) - CM/SW Discharge Note   Patient Details  Name: Ethan Alvarez MRN: [id number] Date of Birth: November 21, 1961  Transition of Care [facility]) CM/SW Contact:  Liliana Cline, LCSW Phone Number: 01/03/2021, 10:15 AM   Clinical Narrative:   Patient to discharge home with home health PT today. Informed [facility] Health Representative [place]. Informed Legal Guardian Ethan Alvarez. Ethan Alvarez asked for LPN to call him when EMS picks patient up so he can ensure someone is at the home to let patient in, updated LPN of request. EMS paperwork placed in DC Packet. Will call EMS when LPN is ready.   11:27- Notified by LPN patient ready for transport. Called [facility] [facility] transport, patient is 2nd on the list. Informed EMS per Guardian patient does have to go down flight of steps to get into his apartment and is 250 lbs.   3:00- Called [facility] to inquire why patient has not been picked up yet. Confirmed that patient is still on the list for EMS. Asked what place patient is on the list, Representative reported Crew Chief is determining who gets picked up based on their location, unable to give estimate on when this patient will be picked up. Updated LPN.  Final next level of care: Home w Home Health Services Barriers to Discharge: Barriers Resolved   Patient Goals and CMS Choice Patient states their goals for this hospitalization and ongoing recovery are:: home with home health CMS Medicare.gov Compare Post Acute Care list provided to:: Patient Represenative (must comment) Choice offered to / list presented to : Sibling  Discharge Placement                Patient to be transferred to facility by: [facility] Name of family member notified: Ethan Alvarez- legal guardian Patient and family notified of of transfer: 01/03/21  Discharge Plan and Services     Post Acute Care Choice: Skilled Nursing Facility                    HH Arranged: PT [facility] Agency: [facility]  Services Date [facility] Agency Contacted: 01/03/21   Representative spoke with at [facility] Agency: Becky Sax  Social Determinants of Health (SDOH) Interventions     Readmission Risk Interventions Readmission Risk Prevention Plan 04/26/2019  Medication Screening Complete  Transportation Screening Complete  Some recent data might be hidden

## 2021-01-03 NOTE — Discharge Summary (Signed)
[facility] - [place] at [facility]   PATIENT NAME: Ethan Alvarez    MR#:  [id number]  DATE OF BIRTH:  06/17/1961  DATE OF ADMISSION:  12/30/2020 ADMITTING PHYSICIAN: Hannah BeatJan A Mansy, MD  DATE OF DISCHARGE: 01/03/2021  PRIMARY CARE PHYSICIAN: Bernette Redbirdosenthal, Amy, MD    ADMISSION DIAGNOSIS:  Dehydration [E86.0] Generalized weakness [R53.1] Altered mental status, unspecified altered mental status type [R41.82] COVID-19 virus infection [U07.1] Encephalopathy due to COVID-19 virus [U07.1, G93.49]  DISCHARGE DIAGNOSIS:  Generalized weakness COVID 19 infection  Acute metabolic encephalopathy--improved Sepsis POA--resolved SECONDARY DIAGNOSIS:   Past Medical History:  Diagnosis Date  . Hypertension   . Schizophrenia [facility](HCC)     HOSPITAL COURSE:   DouglasRhueis a60 y.o.Caucasian malewith a known history of hypertension and schizophrenia, who presented to the ER with acute onset of generalized weakness over the last few days with altered mental status with significant lethargy. According to the brother Ethan Alvarez who gives most of the history he has been feeling so weak that he can get out of chair  Acute encephalopathy and sepsissecondary to COVID-19POA.Sepsis is manifested by tachycardia and tachypnea and wbc <4K -sepsis resolved --patient on IV remdesivir in ER -- medically not exhibiting any respiratory symptoms. d/c steroids and remdesivir - CRP 5.7. Pro calcitonin <0.10 -FDP and D-dimer wnl -Chest CTA was ordered to assess for PE and COVID-19 pneumoniaand came back negative for both. -- Sats are more than 92% on room air -no fever, eating well, more alert and pleasant and worked very well with PT today.   Essential hypertension. -continue propranolol.  Chronic Schizophrenia. - continue olanzapine,Cogentin, invega, sertraline,ativan. -- [facility]  Depression and anxiety. -continue Zoloft, Ativan and  Depakote.  Generalized weakness -- 1/17-- patient was reevaluated by physical therapy and he did very well. He got on the bedside commode and took several steps in the room. Patient should be able to go home with home health PT. He has personal caregiver/CNA at home. Brother wishes patient to return home and his family environment given his mental health.   DVT prophylaxis. -Subcutaneous Lovenox.  Procedures: Family communication : brother Ethan Alvarez on the phone.1/17  Consults :noneCODE STATUS: partial DVT Prophylaxis : Lovenox  Status is: Inpatient     Dispo: The patient is from: Home  Anticipated d/c is to: Home with PT  Anticipated d/c date is: today  Patient currently is medically best at baseline for discharge TOC for discharge planning. CONSULTS OBTAINED:    DRUG ALLERGIES:   Allergies  Allergen Reactions  . Haloperidol Lactate Other (See Comments)    Per [facility]; nature of intolerance unclear intolerance Other reaction(s): Other (See Comments) Per [facility]; nature of intolerance unclear     DISCHARGE MEDICATIONS:   Allergies as of 01/03/2021      Reactions   Haloperidol Lactate Other (See Comments)   Per [facility]; nature of intolerance unclear intolerance Other reaction(s): Other (See Comments) Per [facility]; nature of intolerance unclear      Medication List    STOP taking these medications   Invega Sustenna 234 MG/1.5ML Susy injection Generic drug: paliperidone   polyethylene glycol 17 g packet Commonly known as: MIRALAX / GLYCOLAX     TAKE these medications   acetaminophen 325 MG tablet Commonly known as: TYLENOL Take 650 mg by mouth 2 (two) times daily.   ascorbic acid 500 MG tablet Commonly known as: VITAMIN C Take 250 mg by mouth daily.   benztropine 0.5 MG tablet Commonly known as: COGENTIN  Take 0.5 mg by mouth every morning.   benztropine 1 MG tablet Commonly known as: COGENTIN Take 1 mg by mouth 2 (two)  times daily.   cholecalciferol 25 MCG (1000 UNIT) tablet Commonly known as: VITAMIN D3 Take 2,000 Units by mouth daily.   divalproex 500 MG DR tablet Commonly known as: DEPAKOTE Take 1,000 mg by mouth 2 (two) times a day.   LORazepam 1 MG tablet Commonly known as: ATIVAN Take 2 tablets (2 mg total) by mouth in the morning, at noon, and at bedtime. Takes at 0800, 1400, 2000   OLANZapine 5 MG tablet Commonly known as: ZYPREXA Take 5 mg by mouth See admin instructions. 5 mg 0800 and 1400   paliperidone 6 MG 24 hr tablet Commonly known as: INVEGA Take 6 mg by mouth daily.   paliperidone 9 MG 24 hr tablet Commonly known as: INVEGA Take 9 mg by mouth at bedtime.   propranolol 20 MG tablet Commonly known as: INDERAL Take 20 mg by mouth 2 (two) times daily.   sertraline 100 MG tablet Commonly known as: ZOLOFT Take 100 mg by mouth 2 (two) times a day.   vitamin B-12 100 MCG tablet Commonly known as: CYANOCOBALAMIN Take 100 mcg by mouth daily.       If you experience worsening of your admission symptoms, develop shortness of breath, life threatening emergency, suicidal or homicidal thoughts you must seek medical attention immediately by calling 911 or calling your MD immediately  if symptoms less severe.  You Must read complete instructions/literature along with all the possible adverse reactions/side effects for all the Medicines you take and that have been prescribed to you. Take any new Medicines after you have completely understood and accept all the possible adverse reactions/side effects.   Please note  You were cared for by a hospitalist during your hospital stay. If you have any questions about your discharge medications or the care you received while you were in the hospital after you are discharged, you can call the unit and asked to speak with the hospitalist on call if the hospitalist that took care of you is not available. Once you are discharged, your primary care  physician will handle any further medical issues. Please note that NO REFILLS for any discharge medications will be authorized once you are discharged, as it is imperative that you return to your primary care physician (or establish a relationship with a primary care physician if you do not have one) for your aftercare needs so that they can reassess your need for medications and monitor your lab values. Today   SUBJECTIVE   More awake alert. Eating BF. No fever. Worked with PT and did quiet well  VITAL SIGNS:  Blood pressure 129/83, pulse 83, temperature 98 F (36.7 C), resp. rate 17, height 5\' 8"  (1.727 m), weight 113.4 kg, SpO2 92 %.  I/O:    Intake/Output Summary (Last 24 hours) at 01/03/2021 0943 Last data filed at 01/03/2021 0130 Gross per 24 hour  Intake --  Output 800 ml  Net -800 ml    PHYSICAL EXAMINATION:  GENERAL:  60 y.o.-year-old patient lying in the bed with no acute distress. obese LUNGS: Normal breath sounds bilaterally, no wheezing, rales,rhonchi or crepitation. No use of accessory muscles of respiration.  CARDIOVASCULAR: S1, S2 normal. No murmurs, rubs, or gallops.  ABDOMEN: Soft, non-tender, non-distended. Bowel sounds present. No organomegaly or mass.  EXTREMITIES: No pedal edema, cyanosis, or clubbing.  NEUROLOGIC: grossly nonfocal. Moves all extremities well.  PSYCHIATRIC: The patient is alert and awake. Pleasant SKIN: No obvious rash, lesion, or ulcer.   DATA REVIEW:   CBC  Recent Labs  Lab 12/29/20 1951 01/01/21 0629 01/02/21 0413  WBC 3.8*  --   --   HGB 16.1  --   --   HCT 45.0  --   --   PLT 86*   < > 87*   < > = values in this interval not displayed.    Chemistries  Recent Labs  Lab 12/29/20 1951 01/02/21 0413  NA 134*  --   K 4.3  --   CL 96*  --   CO2 26  --   GLUCOSE 112*  --   BUN 14  --   CREATININE 0.74 0.88  CALCIUM 8.7*  --   AST 36  --   ALT 45*  --   ALKPHOS 46  --   BILITOT 0.9  --     Microbiology Results    Recent Results (from the past 240 hour(s))  Resp Panel by RT-PCR (Flu A&B, Covid) Nasopharyngeal Swab     Status: Abnormal   Collection Time: 12/30/20  2:12 AM   Specimen: Nasopharyngeal Swab; Nasopharyngeal(NP) swabs in vial transport medium  Result Value Ref Range Status   SARS Coronavirus 2 by RT PCR POSITIVE (A) NEGATIVE Final    Comment: RESULT CALLED TO, READ BACK BY AND VERIFIED WITH: Kathyrn Sheriff RN [phone] 12/30/20 HNM (NOTE) SARS-CoV-2 target nucleic acids are DETECTED.  The SARS-CoV-2 RNA is generally detectable in upper respiratory specimens during the acute phase of infection. Positive results are indicative of the presence of the identified virus, but do not rule out bacterial infection or co-infection with other pathogens not detected by the test. Clinical correlation with patient history and other diagnostic information is necessary to determine patient infection status. The expected result is Negative.  Fact Sheet for Patients: BloggerCourse.com  Fact Sheet for Healthcare Providers: SeriousBroker.it  This test is not yet approved or cleared by the [place] FDA and  has been authorized for detection and/or diagnosis of SARS-CoV-2 by FDA under an Emergency Use Authorization (EUA).  This EUA will remain in effect (meaning this test can be  used) for the duration of  the COVID-19 declaration under Section 564(b)(1) of the Act, 21 U.S.C. section 360bbb-3(b)(1), unless the authorization is terminated or revoked sooner.     Influenza A by PCR NEGATIVE NEGATIVE Final   Influenza B by PCR NEGATIVE NEGATIVE Final    Comment: (NOTE) The Xpert Xpress SARS-CoV-2/FLU/RSV plus assay is intended as an aid in the diagnosis of influenza from Nasopharyngeal swab specimens and should not be used as a sole basis for treatment. Nasal washings and aspirates are unacceptable for Xpert Xpress SARS-CoV-2/FLU/RSV testing.  Fact  Sheet for Patients: BloggerCourse.com  Fact Sheet for Healthcare Providers: SeriousBroker.it  This test is not yet approved or cleared by the [place] FDA and has been authorized for detection and/or diagnosis of SARS-CoV-2 by FDA under an Emergency Use Authorization (EUA). This EUA will remain in effect (meaning this test can be used) for the duration of the COVID-19 declaration under Section 564(b)(1) of the Act, 21 U.S.C. section 360bbb-3(b)(1), unless the authorization is terminated or revoked.  Performed at [facility], [address], [place], [place] [address]     RADIOLOGY:  No results found.   CODE STATUS:     Code Status Orders  (From admission, onward)  Start     Ordered   12/30/20 0531  Limited resuscitation (code)  Continuous       Question Answer Comment  In the event of cardiac or respiratory ARREST: Initiate Code Blue, Call Rapid Response Yes   In the event of cardiac or respiratory ARREST: Perform CPR Yes   In the event of cardiac or respiratory ARREST: Perform Intubation/Mechanical Ventilation No   In the event of cardiac or respiratory ARREST: Use NIPPV/BiPAp only if indicated Yes   In the event of cardiac or respiratory ARREST: Administer ACLS medications if indicated Yes   In the event of cardiac or respiratory ARREST: Perform Defibrillation or Cardioversion if indicated Yes      12/30/20 0531        Code Status History    Date Active Date Inactive Code Status Order ID Comments User Context   12/30/2020 0442 12/30/2020 0531 Full Code [id number]  Hannah Beat, MD ED   09/24/2019 2349 09/29/2019 0020 Full Code [id number]  Ward, Layla Maw, DO ED   09/14/2019 1444 09/15/2019 1457 Full Code [id number]  Jama Flavors, MD ED   06/01/2019 2031 06/09/2019 2011 Full Code [id number]  Milagros Loll, MD ED   04/25/2019 1826 04/28/2019 2206 Full Code [id number]  Poggi, Excell Seltzer, MD Inpatient   Advance Care  Planning Activity       TOTAL TIME TAKING CARE OF THIS PATIENT: *40* minutes.    Enedina Finner M.D  [facility]    CC: Primary care physician; Bernette Redbird, MD

## 2021-01-03 NOTE — Plan of Care (Signed)

## 2021-01-03 NOTE — Progress Notes (Signed)
[facility]  - [place] at [facility]   PATIENT NAME: Ethan Alvarez    MR#:  [id number]  DATE OF BIRTH:  12/11/1961  SUBJECTIVE:  No new complaints. Pleasant and calm Unable to discharge today due to weather and no EMS available for non urgent transport  REVIEW OF SYSTEMS:   Review of Systems  Constitutional: Negative for chills, fever and weight loss.  HENT: Negative for ear discharge, ear pain and nosebleeds.   Eyes: Negative for blurred vision, pain and discharge.  Respiratory: Negative for sputum production, shortness of breath, wheezing and stridor.   Cardiovascular: Negative for chest pain, palpitations, orthopnea and PND.  Gastrointestinal: Negative for abdominal pain, diarrhea, nausea and vomiting.  Genitourinary: Negative for frequency and urgency.  Musculoskeletal: Negative for back pain and joint pain.  Neurological: Negative for sensory change, speech change, focal weakness and weakness.  Psychiatric/Behavioral: Negative for depression and hallucinations. The patient is not nervous/anxious.    Tolerating Diet:yes Tolerating PT: home with HHPT  DRUG ALLERGIES:   Allergies  Allergen Reactions  . Haloperidol Lactate Other (See Comments)    Per [facility]; nature of intolerance unclear intolerance Other reaction(s): Other (See Comments) Per [facility]; nature of intolerance unclear     VITALS:  Blood pressure 129/83, pulse 83, temperature 98 F (36.7 C), resp. rate 17, height 5\' 8"  (1.727 m), weight 113.4 kg, SpO2 92 %.  PHYSICAL EXAMINATION:   Physical Exam  GENERAL:  60 y.o.-year-old patient lying in the bed with no acute distress. OBESE HEENT: Head atraumatic, normocephalic. Oropharynx and nasopharynx clear.  LUNGS: Normal breath sounds bilaterally, no wheezing, rales, rhonchi. No use of accessory muscles of respiration.  CARDIOVASCULAR: S1, S2 normal. No murmurs, rubs, or gallops.  ABDOMEN: Soft, nontender, nondistended. Bowel sounds present. No  organomegaly or mass.  EXTREMITIES: No cyanosis, clubbing or edema b/l.    NEUROLOGIC: Cranial nerves II through XII are intact. No focal Motor or sensory deficits b/l.   PSYCHIATRIC:  patient is alert and awake. pleasant.  SKIN: No obvious rash, lesion, or ulcer.   LABORATORY PANEL:  CBC Recent Labs  Lab 12/29/20 1951 01/01/21 0629 01/02/21 0413  WBC 3.8*  --   --   HGB 16.1  --   --   HCT 45.0  --   --   PLT 86*   < > 87*   < > = values in this interval not displayed.    Chemistries  Recent Labs  Lab 12/29/20 1951 01/02/21 0413  NA 134*  --   K 4.3  --   CL 96*  --   CO2 26  --   GLUCOSE 112*  --   BUN 14  --   CREATININE 0.74 0.88  CALCIUM 8.7*  --   AST 36  --   ALT 45*  --   ALKPHOS 46  --   BILITOT 0.9  --    Cardiac Enzymes No results for input(s): TROPONINI in the last 168 hours. RADIOLOGY:  No results found. ASSESSMENT AND PLAN:  DouglasRhueis a59 y.o.Caucasian malewith a known history of hypertension and schizophrenia, who presented to the ER with acute onset of generalized weakness over the last few days with altered mental status with significant lethargy. According to the brotherBArrywho gives most of the history he has been feeling so weak that he can get out of chair  Acute encephalopathy and sepsissecondary to COVID-19POA.Sepsis is manifested by tachycardia and tachypneaand wbc <4K -sepsis resolved --patient on IV remdesivir in ER --  medically not exhibiting any respiratory symptoms.d/c steroids and remdesivir -CRP 5.7. Pro calcitonin <0.10 -FDP and D-dimer wnl -Chest CTA was ordered to assess for PE and COVID-19 pneumoniaand came back negative for both. --Sats are more than 92% on room air -- urine culture pending -no fever, eating well, more alert and pleasant.  Essential hypertension. -continue propranolol.  ChronicSchizophrenia. -continue olanzapine,Cogentin, invega, sertraline,ativan. --Patient follows  with [facility] psychiatry  Depression and anxiety. -continue Zoloft and Ativan as well as Depakote.  Generalized weakness --PT -recommends rehab. TOC d/w pt's care giver at  Home and Brother [name]--now in agreement fro rehab  DVT prophylaxis. -Subcutaneous Lovenox.  Procedures: Family communication:brother [name] on the phone Consults:none CODE STATUS:partial DVT Prophylaxis:Lovenox  Status is: Inpatient     Dispo: The patient is from:Home Anticipated d/c is JA:SNKN with HH Anticipated d/c date LZ:JQBHA but NO ems transport available per [facility] Patient currently is medically best at baseline for dischargeto home TOC for discharge planning.          TOTAL TIME TAKING CARE OF THIS PATIENT: *25* minutes.  >50% time spent on counselling and coordination of care  Note: This dictation was prepared with Dragon dictation along with smaller phrase technology. Any transcriptional errors that result from this process are unintentional.  Enedina Finner M.D    [facility]   CC: Primary care physician; Bernette Redbird, MDPatient ID: Ethan Alvarez, male   DOB: 08-Feb-1961, 60 y.o.   MRN: [id number]

## 2021-01-03 NOTE — Progress Notes (Signed)
Patient transported via EMS to private residence . Legal Guardian notified of transfer . Discharge packet with patient IV removed froM [facility]

## 2021-03-27 ENCOUNTER — Ambulatory Visit
Admission: EM | Admit: 2021-03-27 | Discharge: 2021-03-27 | Disposition: A | Discharge status: Home / Self Care | Payer: Medicare Other | Attending: Physician Assistant | Admitting: Physician Assistant

## 2021-03-27 ENCOUNTER — Encounter: Payer: Self-pay | Admitting: Emergency Medicine

## 2021-03-27 ENCOUNTER — Ambulatory Visit (INDEPENDENT_AMBULATORY_CARE_PROVIDER_SITE_OTHER): Payer: Medicare Other

## 2021-03-27 ENCOUNTER — Other Ambulatory Visit: Payer: Self-pay

## 2021-03-27 DIAGNOSIS — R609 Edema, unspecified: Secondary | ICD-10-CM | POA: Diagnosis not present

## 2021-03-27 DIAGNOSIS — R233 Spontaneous ecchymoses: Secondary | ICD-10-CM | POA: Diagnosis not present

## 2021-03-27 DIAGNOSIS — R232 Flushing: Secondary | ICD-10-CM

## 2021-03-27 DIAGNOSIS — D696 Thrombocytopenia, unspecified: Secondary | ICD-10-CM | POA: Diagnosis not present

## 2021-03-27 DIAGNOSIS — M7989 Other specified soft tissue disorders: Secondary | ICD-10-CM | POA: Diagnosis not present

## 2021-03-27 DIAGNOSIS — M79671 Pain in right foot: Secondary | ICD-10-CM | POA: Diagnosis present

## 2021-03-27 LAB — CBC WITH DIFFERENTIAL/PLATELET
Abs Immature Granulocytes: 0.07 10*3/uL (ref 0.00–0.07)
Basophils Absolute: 0 10*3/uL (ref 0.0–0.1)
Basophils Relative: 0 %
Eosinophils Absolute: 0.1 10*3/uL (ref 0.0–0.5)
Eosinophils Relative: 3 %
HCT: 44.7 % (ref 39.0–52.0)
Hemoglobin: 15.8 g/dL (ref 13.0–17.0)
Immature Granulocytes: 2 %
Lymphocytes Relative: 23 %
Lymphs Abs: 1.1 10*3/uL (ref 0.7–4.0)
MCH: 32.8 pg (ref 26.0–34.0)
MCHC: 35.3 g/dL (ref 30.0–36.0)
MCV: 92.9 fL (ref 80.0–100.0)
Monocytes Absolute: 0.6 10*3/uL (ref 0.1–1.0)
Monocytes Relative: 12 %
Neutro Abs: 2.8 10*3/uL (ref 1.7–7.7)
Neutrophils Relative %: 60 %
Platelets: 101 10*3/uL — ABNORMAL LOW (ref 150–400)
RBC: 4.81 MIL/uL (ref 4.22–5.81)
RDW: 12.7 % (ref 11.5–15.5)
WBC: 4.7 10*3/uL (ref 4.0–10.5)
nRBC: 0 % (ref 0.0–0.2)

## 2021-03-27 LAB — COMPREHENSIVE METABOLIC PANEL
ALT: 37 U/L (ref 0–44)
AST: 37 U/L (ref 15–41)
Albumin: 4.3 g/dL (ref 3.5–5.0)
Alkaline Phosphatase: 39 U/L (ref 38–126)
Anion gap: 8 (ref 5–15)
BUN: 20 mg/dL (ref 6–20)
CO2: 28 mmol/L (ref 22–32)
Calcium: 9.3 mg/dL (ref 8.9–10.3)
Chloride: 100 mmol/L (ref 98–111)
Creatinine, Ser: 0.75 mg/dL (ref 0.61–1.24)
GFR, Estimated: >60 mL/min (ref >60)
Glucose, Bld: 128 mg/dL — ABNORMAL HIGH (ref 70–99)
Potassium: 4.5 mmol/L (ref 3.5–5.1)
Sodium: 136 mmol/L (ref 135–145)
Total Bilirubin: 0.7 mg/dL (ref 0.3–1.2)
Total Protein: 7.6 g/dL (ref 6.5–8.1)

## 2021-03-27 LAB — URIC ACID: Uric Acid, Serum: 5.1 mg/dL (ref 3.7–8.6)

## 2021-03-27 NOTE — Discharge Instructions (Addendum)
The x-ray is normal.  The lab work does show that the platelet count is low.  That is the cause for the appearance of the skin of the feet.  The increased swelling is likely what is causing the pain.  Elevate the extremities.  Can take Tylenol for any discomfort.  You have had low platelets since earlier this year after having COVID-19.  I have sent a  referral to hematology/oncology. Or you can call PCP to try to follow up with him.  For any increase in pain, fatigue, dizziness, confusion or weakness, fevers, chest pain or breathing problem call 911 or take to ED.

## 2021-03-27 NOTE — ED Triage Notes (Signed)
Pt c/o right foot pain. Pt caregiver states it has been red and swelling for a couple of weeks now.

## 2021-03-27 NOTE — ED Provider Notes (Signed)
[facility]    CSN: [id number] Arrival date & time: 03/27/21  1140      History   Chief Complaint Chief Complaint  Patient presents with  . Foot Pain    right    HPI Ethan Alvarez is a 60 y.o. male presenting with his home aide, Gunnar Fusi.  Patient's aide states that she has noticed increased swelling and redness of his right foot over the past couple of weeks.  She states that she tried to get an appointment with his PCP but was unable to.  Patient's PCP is Dr. Audelia Acton.  He is having some pain when ambulating on the right foot.  No known injury.  He denies any numbness or tingling.  Has not taken any medication.  Tries to elevate feet to help with swelling, but she says he will not wear compression stockings. No similar problems in the past.  No associated fevers, fatigue.  No history of arthritis or gout.  Does have a significant medical history for schizophrenia and is on multiple medications (depakote, lorazepam, Invega, Zoloft, and olanzapine) for this.  No other concerns.  HPI  Past Medical History:  Diagnosis Date  . Hypertension   . Schizophrenia [facility])     Patient Active Problem List   Diagnosis Date Noted  . Dehydration   . Altered mental status   . Sepsis without acute organ dysfunction (HCC)   . COVID-19 virus infection 12/30/2020  . Generalized weakness   . Schizophrenia (HCC)   . Aggressive behavior of adult 09/25/2019  . Suicidal ideation 09/25/2019  . Fall 09/14/2019  . MSSA bacteremia 07/09/2019  . Septic arthritis (HCC) 07/09/2019  . Cellulitis of right knee 06/01/2019  . Rupture of right quadriceps tendon 04/25/2019    Past Surgical History:  Procedure Laterality Date  . INCISION AND DRAINAGE Right 06/02/2019   Procedure: INCISION AND DRAINAGE;  Surgeon: Christena Flake, MD;  Location: [facility] ORS;  Service: Orthopedics;  Laterality: Right;  . QUADRICEPS TENDON REPAIR Right 04/25/2019   Procedure: REPAIR QUADRICEP TENDON;  Surgeon: Christena Flake, MD;   Location: [facility] ORS;  Service: Orthopedics;  Laterality: Right;  . QUADRICEPS TENDON REPAIR Right 06/02/2019   Procedure: REPAIR QUADRICEP TENDON;  Surgeon: Christena Flake, MD;  Location: [facility] ORS;  Service: Orthopedics;  Laterality: Right;       Home Medications    Prior to Admission medications   Medication Sig Start Date End Date Taking? Authorizing Provider  acetaminophen (TYLENOL) 325 MG tablet Take 650 mg by mouth 2 (two) times daily.   Yes [provider]  ascorbic acid (VITAMIN C) 500 MG tablet Take 250 mg by mouth daily.   Yes [provider]  benztropine (COGENTIN) 0.5 MG tablet Take 0.5 mg by mouth every morning.    Yes [provider]  benztropine (COGENTIN) 1 MG tablet Take 1 mg by mouth 2 (two) times daily.  03/25/19  Yes [provider]  cholecalciferol (VITAMIN D3) 25 MCG (1000 UT) tablet Take 2,000 Units by mouth daily.   Yes [provider]  LORazepam (ATIVAN) 1 MG tablet Take 2 tablets (2 mg total) by mouth in the morning, at noon, and at bedtime. Takes at 0800, 1400, 2000 12/31/20  Yes Enedina Finner, MD  OLANZapine (ZYPREXA) 5 MG tablet Take 5 mg by mouth See admin instructions. 5 mg 0800 and 1400   Yes [provider]  paliperidone (INVEGA) 6 MG 24 hr tablet Take 6 mg by mouth daily.  Yes [provider]  paliperidone (INVEGA) 9 MG 24 hr tablet Take 9 mg by mouth at bedtime.   Yes [provider]  propranolol (INDERAL) 20 MG tablet Take 20 mg by mouth 2 (two) times daily. 08/12/18  Yes [provider]  vitamin B-12 (CYANOCOBALAMIN) 100 MCG tablet Take 100 mcg by mouth daily.   Yes [provider]  divalproex (DEPAKOTE) 500 MG DR tablet Take 1,000 mg by mouth 2 (two) times a day. 02/13/17 12/30/20  [provider]  sertraline (ZOLOFT) 100 MG tablet Take 100 mg by mouth 2 (two) times a day. 01/30/18 12/30/20  [provider]    Family History History reviewed. No pertinent  family history.  Social History Social History   Tobacco Use  . Smoking status: Never Smoker  . Smokeless tobacco: Never Used  Vaping Use  . Vaping Use: Never used  Substance Use Topics  . Alcohol use: Not Currently  . Drug use: Not Currently     Allergies   Haloperidol lactate   Review of Systems Review of Systems  Constitutional: Negative for fatigue and fever.  Musculoskeletal: Positive for arthralgias and joint swelling. Negative for gait problem.  Skin: Positive for color change.  Neurological: Negative for dizziness, weakness, numbness and headaches.  Hematological: Does not bruise/bleed easily.  Psychiatric/Behavioral: Negative for confusion.     Physical Exam Triage Vital Signs ED Triage Vitals  Enc Vitals Group     BP 03/27/21 1156 132/85     Pulse Rate 03/27/21 1156 (!) 101     Resp 03/27/21 1156 20     Temp 03/27/21 1156 97.6 F (36.4 C)     Temp Source 03/27/21 1156 Oral     SpO2 03/27/21 1156 96 %     Weight 03/27/21 1154 250 lb (113.4 kg)     Height 03/27/21 1154 5\' 8"  (1.727 m)     Head Circumference --      Peak Flow --      Pain Score 03/27/21 1153 0     Pain Loc --      Pain Edu? --      Excl. in GC? --    No data found.  Updated Vital Signs BP 132/85 (BP Location: Left Arm)   Pulse (!) 101   Temp 97.6 F (36.4 C) (Oral)   Resp 20   Ht 5\' 8"  (1.727 m)   Wt 250 lb (113.4 kg)   SpO2 96%   BMI 38.01 kg/m       Physical Exam Vitals and nursing note reviewed.  Constitutional:      General: He is not in acute distress.    Appearance: Normal appearance. He is well-developed. He is not ill-appearing.  HENT:     Head: Normocephalic and atraumatic.  Eyes:     General: No scleral icterus.    Conjunctiva/sclera: Conjunctivae normal.  Cardiovascular:     Rate and Rhythm: Regular rhythm. Tachycardia present.     Pulses: Normal pulses.  Pulmonary:     Effort: Pulmonary effort is normal. No respiratory distress.     Breath sounds:  Normal breath sounds.  Musculoskeletal:     Cervical back: Neck supple.  Skin:    General: Skin is warm and dry.     Comments: RIGHT FOOT: 1+ pitting edema to ankle. Fine petechia of dorsal foot and ankle. No TTP of any part of foot/ankle, but he does complain of pain with weightbearing. No increased warmth.  LEFT FOOT: Fine  petechia of dorsal foot. No TTP to any part of foot or ankle. No increased warmth.   Neurological:     General: No focal deficit present.     Mental Status: He is alert. Mental status is at baseline.     Motor: No weakness.     Gait: Gait normal.  Psychiatric:        Mood and Affect: Mood normal.        Behavior: Behavior normal.        Thought Content: Thought content normal.          UC Treatments / Results  Labs (all labs ordered are listed, but only abnormal results are displayed) Labs Reviewed  CBC WITH DIFFERENTIAL/PLATELET - Abnormal; Notable for the following components:      Result Value   Platelets 101 (*)    All other components within normal limits  COMPREHENSIVE METABOLIC PANEL - Abnormal; Notable for the following components:   Glucose, Bld 128 (*)    All other components within normal limits  URIC ACID    EKG   Radiology DG Foot Complete Right  Result Date: 03/27/2021 CLINICAL DATA:  Swelling and redness EXAM: RIGHT FOOT COMPLETE - 3+ VIEW COMPARISON:  None. FINDINGS: Frontal, oblique, and lateral views obtained. There is no fracture or dislocation. The joint spaces appear normal. No erosive change or bony destruction. There are small posterior and inferior calcaneal spurs. No soft tissue air or radiopaque foreign body. IMPRESSION: Calcaneal spurs. No appreciable joint space narrowing. No erosion or bony destruction. No fracture or dislocation. Electronically Signed   By: Bretta BangWilliam  Woodruff III M.D.   On: 03/27/2021 13:06    Procedures Procedures (including critical care time)  Medications Ordered in UC Medications - No data to  display  Initial Impression / Assessment and Plan / UC Course  I have reviewed the triage vital signs and the nursing notes.  Pertinent labs & imaging results that were available during my care of the patient were reviewed by me and considered in my medical decision making (see chart for details).    60 year old male presenting with his in-home aide today for concerns about increased redness, swelling and pain in the right foot and ankle for a few weeks to month.  No known injury.  Exam consistent with diffuse fine petechia of bilateral feet.  No tenderness to any part of the feet or ankles.  1+ pitting edema of the right ankle.  X-ray of the right foot obtained. X-ray negative for acute abnormality.  CBC and CMP obtained to assess for any signs of infection or changes to platelets which could be cause for his petechia.  Also obtained a uric acid to assess for possible underlying gout. I do realize that a normal value does not rule it out, but an increased level could rule it in.  CBC shows decreased platelets of 101.  CMP shows glucose elevated at 128.  He is not fasting.  Uric acid level is normal.  Reviewed results with patient and his aide.  Advised him that his exam is consistent with petechial rash and peripheral edema.  Suspect the petechial rash may be due to low platelets.  Uncertain if his platelets are low due to COVID-19 in January 2022.  Or, advised that this could be due to medications that he is taking or vascular disease (vasculitis, PVD).  Platelet count in January 2022 was 77-87.  This is when he was in the hospital with COVID-19 infection.  Platelet  count was 112 last month that is PCP office.  Patient has a state that they are unaware that his platelet count was low.  They did not want to wait for an appointment with Dr. Audelia Acton so I have placed a referral to hematology/oncology at this time.  Counseled him on ED precautions and advised to go for any confusion, weakness, fatigue,  palpitations or feeling faint, spread of rash to other parts of the body, increased swelling of the legs, chest pain or breathing problem.  At this time, supportive care advised with elevating the extremities and using compression stockings.  Also advised he can take Tylenol for discomfort.  Advised to continue at home medications for schizophrenia including the Depakote, lorazepam, Invega, Zoloft and, olanzapine.  Follow-up with psychiatrist as scheduled and consider whether the petechial rash and low platelets could be due to medications he is taking.  Final Clinical Impressions(s) / UC Diagnoses   Final diagnoses:  Foot pain, right  Petechial rash  Thrombocytopenia (HCC)  Peripheral edema     Discharge Instructions     The x-ray is normal.  The lab work does show that the platelet count is low.  That is the cause for the appearance of the skin of the feet.  The increased swelling is likely what is causing the pain.  Elevate the extremities.  Can take Tylenol for any discomfort.  You have had low platelets since earlier this year after having COVID-19.  I have sent a  referral to hematology/oncology. Or you can call PCP to try to follow up with him.  For any increase in pain, fatigue, dizziness, confusion or weakness, fevers, chest pain or breathing problem call 911 or take to ED.    ED Prescriptions    None     PDMP not reviewed this encounter.   Shirlee Latch, PA-C 03/27/21 1535

## 2021-04-24 ENCOUNTER — Inpatient Hospital Stay: Payer: Medicare Other | Attending: Nurse Practitioner | Admitting: Nurse Practitioner

## 2021-04-24 ENCOUNTER — Encounter: Payer: Self-pay | Admitting: Nurse Practitioner

## 2021-04-24 ENCOUNTER — Other Ambulatory Visit: Payer: Self-pay | Admitting: Nurse Practitioner

## 2021-04-24 ENCOUNTER — Other Ambulatory Visit: Payer: Self-pay

## 2021-04-24 ENCOUNTER — Inpatient Hospital Stay: Payer: Medicare Other

## 2021-04-24 VITALS — BP 137/71 | HR 105 | Temp 97.8°F | Resp 18 | Wt 257.5 lb

## 2021-04-24 DIAGNOSIS — R609 Edema, unspecified: Secondary | ICD-10-CM | POA: Diagnosis not present

## 2021-04-24 DIAGNOSIS — F411 Generalized anxiety disorder: Secondary | ICD-10-CM | POA: Insufficient documentation

## 2021-04-24 DIAGNOSIS — F209 Schizophrenia, unspecified: Secondary | ICD-10-CM | POA: Insufficient documentation

## 2021-04-24 DIAGNOSIS — I10 Essential (primary) hypertension: Secondary | ICD-10-CM | POA: Diagnosis not present

## 2021-04-24 DIAGNOSIS — F419 Anxiety disorder, unspecified: Secondary | ICD-10-CM | POA: Insufficient documentation

## 2021-04-24 DIAGNOSIS — R600 Localized edema: Secondary | ICD-10-CM

## 2021-04-24 DIAGNOSIS — L719 Rosacea, unspecified: Secondary | ICD-10-CM | POA: Insufficient documentation

## 2021-04-24 DIAGNOSIS — Z79899 Other long term (current) drug therapy: Secondary | ICD-10-CM | POA: Insufficient documentation

## 2021-04-24 DIAGNOSIS — M00061 Staphylococcal arthritis, right knee: Secondary | ICD-10-CM

## 2021-04-24 DIAGNOSIS — F429 Obsessive-compulsive disorder, unspecified: Secondary | ICD-10-CM | POA: Insufficient documentation

## 2021-04-24 DIAGNOSIS — D696 Thrombocytopenia, unspecified: Secondary | ICD-10-CM

## 2021-04-24 DIAGNOSIS — Z8616 Personal history of COVID-19: Secondary | ICD-10-CM | POA: Insufficient documentation

## 2021-04-24 DIAGNOSIS — M7989 Other specified soft tissue disorders: Secondary | ICD-10-CM

## 2021-04-24 DIAGNOSIS — K921 Melena: Secondary | ICD-10-CM | POA: Diagnosis not present

## 2021-04-24 LAB — COMPREHENSIVE METABOLIC PANEL
ALT: 38 U/L (ref 0–44)
AST: 37 U/L (ref 15–41)
Albumin: 4.1 g/dL (ref 3.5–5.0)
Alkaline Phosphatase: 42 U/L (ref 38–126)
Anion gap: 8 (ref 5–15)
BUN: 13 mg/dL (ref 6–20)
CO2: 28 mmol/L (ref 22–32)
Calcium: 9.4 mg/dL (ref 8.9–10.3)
Chloride: 99 mmol/L (ref 98–111)
Creatinine, Ser: 0.78 mg/dL (ref 0.61–1.24)
GFR, Estimated: >60 mL/min (ref >60)
Glucose, Bld: 130 mg/dL — ABNORMAL HIGH (ref 70–99)
Potassium: 4.2 mmol/L (ref 3.5–5.1)
Sodium: 135 mmol/L (ref 135–145)
Total Bilirubin: 0.6 mg/dL (ref 0.3–1.2)
Total Protein: 7.6 g/dL (ref 6.5–8.1)

## 2021-04-24 LAB — CBC WITH DIFFERENTIAL/PLATELET
Abs Immature Granulocytes: 0.06 10*3/uL (ref 0.00–0.07)
Basophils Absolute: 0 10*3/uL (ref 0.0–0.1)
Basophils Relative: 0 %
Eosinophils Absolute: 0.1 10*3/uL (ref 0.0–0.5)
Eosinophils Relative: 2 %
HCT: 46.5 % (ref 39.0–52.0)
Hemoglobin: 16.3 g/dL (ref 13.0–17.0)
Immature Granulocytes: 2 %
Lymphocytes Relative: 20 %
Lymphs Abs: 0.8 10*3/uL (ref 0.7–4.0)
MCH: 32.5 pg (ref 26.0–34.0)
MCHC: 35.1 g/dL (ref 30.0–36.0)
MCV: 92.8 fL (ref 80.0–100.0)
Monocytes Absolute: 0.4 10*3/uL (ref 0.1–1.0)
Monocytes Relative: 11 %
Neutro Abs: 2.7 10*3/uL (ref 1.7–7.7)
Neutrophils Relative %: 65 %
Platelets: 97 10*3/uL — ABNORMAL LOW (ref 150–400)
RBC: 5.01 MIL/uL (ref 4.22–5.81)
RDW: 12.6 % (ref 11.5–15.5)
WBC: 4.1 10*3/uL (ref 4.0–10.5)
nRBC: 0 % (ref 0.0–0.2)

## 2021-04-24 LAB — FOLATE: Folate: 35 ng/mL (ref >5.9)

## 2021-04-24 LAB — IRON AND TIBC
Iron: 100 ug/dL (ref 45–182)
Saturation Ratios: 26 % (ref 17.9–39.5)
TIBC: 384 ug/dL (ref 250–450)
UIBC: 284 ug/dL

## 2021-04-24 LAB — HEPATITIS C ANTIBODY: HCV Ab: NONREACTIVE

## 2021-04-24 LAB — PATHOLOGIST SMEAR REVIEW

## 2021-04-24 LAB — VITAMIN B12: Vitamin B-12: 578 pg/mL (ref 180–914)

## 2021-04-24 LAB — FERRITIN: Ferritin: 102 ng/mL (ref 24–336)

## 2021-04-24 LAB — HIV ANTIBODY (ROUTINE TESTING W REFLEX): HIV Screen 4th Generation wRfx: NONREACTIVE

## 2021-04-24 NOTE — Progress Notes (Signed)
New Consult Note [facility] [address], [place], [place] [address] [phone] (phone) [phone] (fax)  Patient Care Team: Bernette Redbird, MD as PCP - General (Internal Medicine)   Name of the patient: Ethan Alvarez  [id number]  1961/04/18   Date of visit: 04/24/21  Chief complaint/ Reason for visit-thrombocytopenia  Interval history- patient is 60 year old male with history significant for vasculitis, PVD, schizophrenia, sepsis d/t covid 19 in January 2022, who presents for concerns of chronic thrombocytopenia.  Seen in ER for right lower extremity edema.  On exam, diffuse fine petechiae bilaterally was noted. Today patient who is accompanied by his CMA of 3+ years who contributes to history.  Patient denies history of bleeding.  No new medications.  Denies ingestion of quinine containing beverages.  No recent infections.  Denies unusual dietary practices.  Denies history of hematologic, rheumatologic, bariatric surgeries.  Denies family history of bleeding disorders.  She continues to have right lower extremity edema somewhat improved.  History of surgery to the right upper leg.    Denies any neurologic complaints. Denies recent fevers or illnesses. Denies any easy bleeding or bruising. Reports good appetite and denies weight loss. Denies chest pain. Denies any nausea, vomiting, constipation, or diarrhea. Denies melena or hematochezia. Denies urinary complaints. Patient offers no further specific complaints today.  Review of systems- ROS     Allergies  Allergen Reactions  . Haloperidol Lactate Other (See Comments)    Per [facility]; nature of intolerance unclear intolerance Other reaction(s): Other (See Comments) Per [facility]; nature of intolerance unclear     Past Medical History:  Diagnosis Date  . Hypertension   . Schizophrenia [facility])     Past Surgical History:  Procedure Laterality Date  . INCISION AND DRAINAGE Right 06/02/2019   Procedure: INCISION AND  DRAINAGE;  Surgeon: Christena Flake, MD;  Location: [facility] ORS;  Service: Orthopedics;  Laterality: Right;  . QUADRICEPS TENDON REPAIR Right 04/25/2019   Procedure: REPAIR QUADRICEP TENDON;  Surgeon: Christena Flake, MD;  Location: [facility] ORS;  Service: Orthopedics;  Laterality: Right;  . QUADRICEPS TENDON REPAIR Right 06/02/2019   Procedure: REPAIR QUADRICEP TENDON;  Surgeon: Christena Flake, MD;  Location: [facility] ORS;  Service: Orthopedics;  Laterality: Right;    Social History   Socioeconomic History  . Marital status: Single    Spouse name: Not on file  . Number of children: Not on file  . Years of education: Not on file  . Highest education level: Not on file  Occupational History  . Not on file  Tobacco Use  . Smoking status: Never Smoker  . Smokeless tobacco: Never Used  Vaping Use  . Vaping Use: Never used  Substance and Sexual Activity  . Alcohol use: Not Currently  . Drug use: Not Currently  . Sexual activity: Not Currently  Other Topics Concern  . Not on file  Social History Narrative  . Not on file   Social Determinants of Health   Financial Resource Strain: Not on file  Food Insecurity: Not on file  Transportation Needs: Not on file  Physical Activity: Not on file  Stress: Not on file  Social Connections: Not on file  Intimate Partner Violence: Not on file    History reviewed. No pertinent family history.   Current Outpatient Medications:  .  acetaminophen (TYLENOL) 325 MG tablet, Take 650 mg by mouth 2 (two) times daily., Disp: , Rfl:  .  ascorbic acid (VITAMIN C) 500 MG tablet, Take 250 mg  by mouth daily., Disp: , Rfl:  .  benztropine (COGENTIN) 0.5 MG tablet, Take 0.5 mg by mouth every morning. , Disp: , Rfl:  .  benztropine (COGENTIN) 1 MG tablet, Take 1 mg by mouth 2 (two) times daily. , Disp: , Rfl:  .  cholecalciferol (VITAMIN D3) 25 MCG (1000 UT) tablet, Take 2,000 Units by mouth daily., Disp: , Rfl:  .  LORazepam (ATIVAN) 1 MG tablet, Take 2 tablets (2 mg  total) by mouth in the morning, at noon, and at bedtime. Takes at 0800, 1400, 2000, Disp: 10 tablet, Rfl: 0 .  OLANZapine (ZYPREXA) 5 MG tablet, Take 5 mg by mouth See admin instructions. 5 mg 0800 and 1400, Disp: , Rfl:  .  paliperidone (INVEGA) 6 MG 24 hr tablet, Take 6 mg by mouth daily., Disp: , Rfl:  .  paliperidone (INVEGA) 9 MG 24 hr tablet, Take 9 mg by mouth at bedtime., Disp: , Rfl:  .  propranolol (INDERAL) 20 MG tablet, Take 20 mg by mouth 2 (two) times daily., Disp: , Rfl:  .  sertraline (ZOLOFT) 100 MG tablet, Take 100 mg by mouth 2 (two) times a day., Disp: , Rfl:  .  thiamine 100 MG tablet, Take by mouth., Disp: , Rfl:  .  vitamin B-12 (CYANOCOBALAMIN) 100 MCG tablet, Take 100 mcg by mouth daily., Disp: , Rfl:  .  divalproex (DEPAKOTE) 500 MG DR tablet, Take 1,000 mg by mouth 2 (two) times a day., Disp: , Rfl:   Physical exam:  Vitals:   04/24/21 1026  BP: 137/71  Pulse: (!) 105  Resp: 18  Temp: 97.8 F (36.6 C)  SpO2: 95%  Weight: 257 lb 8 oz (116.8 kg)   Physical Exam   CMP Latest Ref Rng & Units 04/24/2021  Glucose 70 - 99 mg/dL 865(H)  BUN 6 - 20 mg/dL 13  Creatinine 8.46 - 9.62 mg/dL 9.52  Sodium 841 - 324 mmol/L 135  Potassium 3.5 - 5.1 mmol/L 4.2  Chloride 98 - 111 mmol/L 99  CO2 22 - 32 mmol/L 28  Calcium 8.9 - 10.3 mg/dL 9.4  Total Protein 6.5 - 8.1 g/dL 7.6  Total Bilirubin 0.3 - 1.2 mg/dL 0.6  Alkaline Phos 38 - 126 U/L 42  AST 15 - 41 U/L 37  ALT 0 - 44 U/L 38   CBC Latest Ref Rng & Units 04/24/2021  WBC 4.0 - 10.5 K/uL 4.1  Hemoglobin 13.0 - 17.0 g/dL 40.1  Hematocrit 02.7 - 52.0 % 46.5  Platelets 150 - 400 K/uL 97(L)    No images are attached to the encounter.  DG Foot Complete Right  Result Date: 03/27/2021 CLINICAL DATA:  Swelling and redness EXAM: RIGHT FOOT COMPLETE - 3+ VIEW COMPARISON:  None. FINDINGS: Frontal, oblique, and lateral views obtained. There is no fracture or dislocation. The joint spaces appear normal. No erosive change or  bony destruction. There are small posterior and inferior calcaneal spurs. No soft tissue air or radiopaque foreign body. IMPRESSION: Calcaneal spurs. No appreciable joint space narrowing. No erosion or bony destruction. No fracture or dislocation. Electronically Signed   By: Bretta Bang III M.D.   On: 03/27/2021 13:06    Assessment and plan- Patient is a 60 y.o. male who presents for thrombocytopenia.  Thrombocytopenia is mild given platelet count of around 100,000.  Few instances where platelet count has dropped below 100,000.  We reviewed that thrombocytopenia is commonly caused by immune thrombocytopenia/ITP, occult liver disease, infection, and myelodysplastic syndromes.  Congenital conditions may also occur.  We will plan for additional labs today to evaluate further: CBC, CMP, peripheral smear review, HIV screening, hepatitis C antibody (if positive, then hepatitis c RNA testing is indicated to determine if active infection), iron and TIBC, ferritin, vitamin B12, folate, serum copper.  We will also perform an ultrasound of the abdomen to evaluate for liver disease and splenomegaly.  2.  Right lower extremity edema-we will get ultrasound of the right lower extremity to rule out DVT given persistent swelling and discomfort.  3. Blood in stool- possible history of blood in stool. Patient is poor historian. Patient has not had screening colonoscopy. Recommend direct visualization with colonoscopy as opposed to other CC screening methods. Patient agreeable. Refer to GI.   Plan:  Labs today as above Ultrasound Right lower extremity Ultrasound abdomen RTC in 1 month for discussion of lab results Referral to GI    Visit Diagnosis 1. Thrombocytopenia (HCC)   2. Edema of right lower extremity     Patient expressed understanding and was in agreement with this plan. He also understands that He can call clinic at any time with any questions, concerns, or complaints.   Thank you for allowing me  to participate in the care of this very pleasant patient.   Consuello Masse, DNP, AGNP-C [facility] [phone]  CC: PCP & Eusebio Friendly, PA

## 2021-04-25 ENCOUNTER — Ambulatory Visit: Admission: RE | Admit: 2021-04-25 | Payer: Medicare Other | Source: Ambulatory Visit

## 2021-04-25 ENCOUNTER — Encounter: Payer: Self-pay | Admitting: *Deleted

## 2021-04-26 ENCOUNTER — Ambulatory Visit
Admission: RE | Admit: 2021-04-26 | Discharge: 2021-04-26 | Disposition: A | Discharge status: Home / Self Care | Payer: Medicare Other | Source: Ambulatory Visit | Attending: Nurse Practitioner | Admitting: Nurse Practitioner

## 2021-04-26 ENCOUNTER — Encounter: Payer: Self-pay | Admitting: *Deleted

## 2021-04-26 ENCOUNTER — Other Ambulatory Visit: Payer: Self-pay

## 2021-04-26 DIAGNOSIS — D696 Thrombocytopenia, unspecified: Secondary | ICD-10-CM | POA: Diagnosis present

## 2021-04-26 DIAGNOSIS — M7989 Other specified soft tissue disorders: Secondary | ICD-10-CM | POA: Diagnosis present

## 2021-04-27 LAB — COPPER, SERUM: Copper: 98 ug/dL (ref 69–132)

## 2021-06-06 ENCOUNTER — Ambulatory Visit: Payer: Medicare Other | Admitting: Nurse Practitioner

## 2021-06-06 ENCOUNTER — Other Ambulatory Visit: Payer: Medicare Other

## 2021-06-07 ENCOUNTER — Ambulatory Visit: Payer: Medicare Other | Admitting: Oncology

## 2021-06-07 ENCOUNTER — Other Ambulatory Visit: Payer: Medicare Other

## 2021-06-20 ENCOUNTER — Other Ambulatory Visit: Payer: Self-pay

## 2021-06-20 DIAGNOSIS — D696 Thrombocytopenia, unspecified: Secondary | ICD-10-CM

## 2021-06-21 ENCOUNTER — Ambulatory Visit: Payer: Medicare Other | Admitting: Oncology

## 2021-06-21 ENCOUNTER — Other Ambulatory Visit: Payer: Medicare Other

## 2021-06-28 ENCOUNTER — Inpatient Hospital Stay (HOSPITAL_BASED_OUTPATIENT_CLINIC_OR_DEPARTMENT_OTHER): Payer: Medicare Other | Admitting: Oncology

## 2021-06-28 ENCOUNTER — Inpatient Hospital Stay: Payer: Medicare Other | Attending: Oncology

## 2021-06-28 ENCOUNTER — Encounter: Payer: Self-pay | Admitting: Oncology

## 2021-06-28 ENCOUNTER — Other Ambulatory Visit: Payer: Self-pay

## 2021-06-28 VITALS — BP 93/75 | HR 95 | Temp 98.9°F | Resp 20 | Wt 250.9 lb

## 2021-06-28 DIAGNOSIS — D696 Thrombocytopenia, unspecified: Secondary | ICD-10-CM

## 2021-06-28 DIAGNOSIS — F209 Schizophrenia, unspecified: Secondary | ICD-10-CM | POA: Insufficient documentation

## 2021-06-28 DIAGNOSIS — R162 Hepatomegaly with splenomegaly, not elsewhere classified: Secondary | ICD-10-CM

## 2021-06-28 DIAGNOSIS — Z79899 Other long term (current) drug therapy: Secondary | ICD-10-CM | POA: Diagnosis not present

## 2021-06-28 DIAGNOSIS — K760 Fatty (change of) liver, not elsewhere classified: Secondary | ICD-10-CM | POA: Insufficient documentation

## 2021-06-28 LAB — CBC WITH DIFFERENTIAL/PLATELET
Abs Immature Granulocytes: 0.07 10*3/uL (ref 0.00–0.07)
Basophils Absolute: 0 10*3/uL (ref 0.0–0.1)
Basophils Relative: 1 %
Eosinophils Absolute: 0.1 10*3/uL (ref 0.0–0.5)
Eosinophils Relative: 3 %
HCT: 46.1 % (ref 39.0–52.0)
Hemoglobin: 16.3 g/dL (ref 13.0–17.0)
Immature Granulocytes: 2 %
Lymphocytes Relative: 25 %
Lymphs Abs: 1 10*3/uL (ref 0.7–4.0)
MCH: 32.7 pg (ref 26.0–34.0)
MCHC: 35.4 g/dL (ref 30.0–36.0)
MCV: 92.6 fL (ref 80.0–100.0)
Monocytes Absolute: 0.4 10*3/uL (ref 0.1–1.0)
Monocytes Relative: 9 %
Neutro Abs: 2.4 10*3/uL (ref 1.7–7.7)
Neutrophils Relative %: 60 %
Platelets: 106 10*3/uL — ABNORMAL LOW (ref 150–400)
RBC: 4.98 MIL/uL (ref 4.22–5.81)
RDW: 12.7 % (ref 11.5–15.5)
WBC: 4 10*3/uL (ref 4.0–10.5)
nRBC: 0 % (ref 0.0–0.2)

## 2021-06-28 LAB — COMPREHENSIVE METABOLIC PANEL
ALT: 36 U/L (ref 0–44)
AST: 38 U/L (ref 15–41)
Albumin: 4.1 g/dL (ref 3.5–5.0)
Alkaline Phosphatase: 44 U/L (ref 38–126)
Anion gap: 9 (ref 5–15)
BUN: 16 mg/dL (ref 6–20)
CO2: 29 mmol/L (ref 22–32)
Calcium: 9.3 mg/dL (ref 8.9–10.3)
Chloride: 99 mmol/L (ref 98–111)
Creatinine, Ser: 0.94 mg/dL (ref 0.61–1.24)
GFR, Estimated: >60 mL/min (ref >60)
Glucose, Bld: 142 mg/dL — ABNORMAL HIGH (ref 70–99)
Potassium: 4.2 mmol/L (ref 3.5–5.1)
Sodium: 137 mmol/L (ref 135–145)
Total Bilirubin: 0.7 mg/dL (ref 0.3–1.2)
Total Protein: 7.9 g/dL (ref 6.5–8.1)

## 2021-06-28 LAB — IRON AND TIBC
Iron: 106 ug/dL (ref 45–182)
Saturation Ratios: 27 % (ref 17.9–39.5)
TIBC: 395 ug/dL (ref 250–450)
UIBC: 289 ug/dL

## 2021-06-28 NOTE — Progress Notes (Signed)
Hematology/Oncology Consult note [facility]  Telephone:([phone] [phone]  Patient Care Team: Virgel Bouquet, MD as PCP - General (Internal Medicine)   Name of the patient: Ethan Alvarez  [id number]  [birth year]   Date of visit: 06/28/21  Diagnosis-thrombocytopenia possibly secondary to splenomegaly Hepatosplenomegaly of unclear etiology  Chief complaint/ Reason for visit-routine follow-up of splenomegaly  Heme/Onc history: Patient is a 60 year old male with a past medical history significant for schizophrenia who has been seeing [place] for chronic thrombocytopenia.  Patient unable to make any of his own decisions and his brother is his primary Media planner.  He is cared for by a CMA for the last 3 years.  Patient noted to have chronic hepatosplenomegaly on his prior CTA since 2020.  Liver function tests have been normal and there was evidence of fatty liver but no overt cirrhosis.  HIV hepatitis C B12 folate copper and iron studies were normal.  Interval history-patient unable to give any history of his own.  He is here with his caregiver of 3 years who also helps patient's brother in decision-making.  He is presently at his stable state of health.  Denies any bleeding or bruising.  There was some concern for rectal bleeding in the past but patient's caregiver states that she has not seen any recently.  In the past as well it was unclear if it was coming more from scratching anterior abdominal wall and not frank rectal bleeding  ECOG PS- 2 Pain scale- 0   Review of systems- Review of Systems  Unable to perform ROS: Psychiatric disorder     Allergies  Allergen Reactions   Haloperidol Lactate Other (See Comments)    Per [facility]; nature of intolerance unclear intolerance Other reaction(s): Other (See Comments) Per [facility]; nature of intolerance unclear      Past Medical History:  Diagnosis Date   Hypertension    Schizophrenia [facility])       Past Surgical History:  Procedure Laterality Date   INCISION AND DRAINAGE Right 06/02/2019   Procedure: INCISION AND DRAINAGE;  Surgeon: Corky Mull, MD;  Location: [facility] ORS;  Service: Orthopedics;  Laterality: Right;   QUADRICEPS TENDON REPAIR Right 04/25/2019   Procedure: REPAIR QUADRICEP TENDON;  Surgeon: Corky Mull, MD;  Location: [facility] ORS;  Service: Orthopedics;  Laterality: Right;   QUADRICEPS TENDON REPAIR Right 06/02/2019   Procedure: REPAIR QUADRICEP TENDON;  Surgeon: Corky Mull, MD;  Location: [facility] ORS;  Service: Orthopedics;  Laterality: Right;    Social History   Socioeconomic History   Marital status: Single    Spouse name: Not on file   Number of children: Not on file   Years of education: Not on file   Highest education level: Not on file  Occupational History   Not on file  Tobacco Use   Smoking status: Never   Smokeless tobacco: Never  Vaping Use   Vaping Use: Never used  Substance and Sexual Activity   Alcohol use: Not Currently   Drug use: Not Currently   Sexual activity: Not Currently  Other Topics Concern   Not on file  Social History Narrative   Not on file   Social Determinants of Health   Financial Resource Strain: Not on file  Food Insecurity: Not on file  Transportation Needs: Not on file  Physical Activity: Not on file  Stress: Not on file  Social Connections: Not on file  Intimate Partner Violence: Not on file  History reviewed. No pertinent family history.   Current Outpatient Medications:    ascorbic acid (VITAMIN C) 250 MG tablet, Take by mouth., Disp: , Rfl:    ascorbic acid (VITAMIN C) 500 MG tablet, Take 250 mg by mouth daily., Disp: , Rfl:    benztropine (COGENTIN) 0.5 MG tablet, Take 0.5 mg by mouth every morning. , Disp: , Rfl:    benztropine (COGENTIN) 1 MG tablet, Take 1 mg by mouth 2 (two) times daily. , Disp: , Rfl:    cholecalciferol (VITAMIN D3) 25 MCG (1000 UT) tablet, Take 2,000 Units by mouth daily.,  Disp: , Rfl:    Cholecalciferol 25 MCG (1000 UT) tablet, Take by mouth., Disp: , Rfl:    divalproex (DEPAKOTE) 500 MG DR tablet, Take 1,000 mg by mouth 2 (two) times a day., Disp: , Rfl:    divalproex (DEPAKOTE) 500 MG DR tablet, TAKE 2 TABLETS BY MOUTH ONCE EVERY MORNING AT 8AM AND 2 TABS EVERY AFTERNOON AT 2PM, Disp: , Rfl:    LORazepam (ATIVAN) 1 MG tablet, Take 2 tablets (2 mg total) by mouth in the morning, at noon, and at bedtime. Takes at 0800, 1400, 2000, Disp: 10 tablet, Rfl: 0   LORazepam (ATIVAN) 1 MG tablet, Take by mouth., Disp: , Rfl:    OLANZapine (ZYPREXA) 5 MG tablet, Take 5 mg by mouth See admin instructions. 5 mg 0800 and 1400, Disp: , Rfl:    paliperidone (INVEGA) 6 MG 24 hr tablet, Take 6 mg by mouth daily., Disp: , Rfl:    paliperidone (INVEGA) 9 MG 24 hr tablet, Take 9 mg by mouth at bedtime., Disp: , Rfl:    propranolol (INDERAL) 20 MG tablet, Take 20 mg by mouth 2 (two) times daily., Disp: , Rfl:    sertraline (ZOLOFT) 100 MG tablet, Take 100 mg by mouth 2 (two) times a day., Disp: , Rfl:    sertraline (ZOLOFT) 100 MG tablet, Take 1 tablet by mouth twice daily - at 8am AND at 8pm, Disp: , Rfl:    thiamine 100 MG tablet, Take by mouth., Disp: , Rfl:    vitamin B-12 (CYANOCOBALAMIN) 100 MCG tablet, Take 100 mcg by mouth daily., Disp: , Rfl:    acetaminophen (TYLENOL) 325 MG tablet, Take 650 mg by mouth 2 (two) times daily. (Patient not taking: Reported on 06/28/2021), Disp: , Rfl:   Physical exam:  Vitals:   06/28/21 1014  BP: 93/75  Pulse: 95  Resp: 20  Temp: 98.9 F (37.2 C)  SpO2: 95%  Weight: 250 lb 14.1 oz (113.8 kg)   Physical Exam Constitutional:      Appearance: He is obese.     Comments: Shuffled gait  Cardiovascular:     Rate and Rhythm: Normal rate and regular rhythm.     Heart sounds: Normal heart sounds.  Pulmonary:     Effort: Pulmonary effort is normal.     Breath sounds: Normal breath sounds.  Abdominal:     General: Bowel sounds are normal.      Palpations: Abdomen is soft.  Musculoskeletal:     Cervical back: Normal range of motion.  Skin:    General: Skin is warm and dry.  Neurological:     Mental Status: He is alert and oriented to person, place, and time.     CMP Latest Ref Rng & Units 06/28/2021  Glucose 70 - 99 mg/dL 142(H)  BUN 6 - 20 mg/dL 16  Creatinine 0.61 - 1.24 mg/dL 0.94  Sodium  135 - 145 mmol/L 137  Potassium 3.5 - 5.1 mmol/L 4.2  Chloride 98 - 111 mmol/L 99  CO2 22 - 32 mmol/L 29  Calcium 8.9 - 10.3 mg/dL 9.3  Total Protein 6.5 - 8.1 g/dL 7.9  Total Bilirubin 0.3 - 1.2 mg/dL 0.7  Alkaline Phos 38 - 126 U/L 44  AST 15 - 41 U/L 38  ALT 0 - 44 U/L 36   CBC Latest Ref Rng & Units 06/28/2021  WBC 4.0 - 10.5 K/uL 4.0  Hemoglobin 13.0 - 17.0 g/dL 16.3  Hematocrit 39.0 - 52.0 % 46.1  Platelets 150 - 400 K/uL 106(L)     Assessment and plan- Patient is a 60 y.o. male here for routine follow-up of thrombocytopenia  Thrombocytopenia: Possibly secondary to underlying hepatosplenomegaly This can be monitored conservatively without the need for a bone marrow biopsy or any treatment at this time..  Platelet counts have remained more or less stable between 8200 over the last 2 years.  Hepatitis, HIV B12 and folate levels were normal.  Hepatosplenomegaly: Most recent ultrasound abdomen revealed fatty liver and mild splenomegaly the etiology of which is unclear.  Given patient's underlying schizophrenia and health status I am inclined to monitor this conservatively especially given that his LFTs are normal.  I will plan to repeat another ultrasound in September 2022.  Patient's brother and caregivers do not wish to put him through any screening colonoscopy at this time.  No evidence of iron deficiency anemia.  Labs in 3 in 6 months and I will see him in 6 months with an ultrasound prior   Visit Diagnosis 1. Thrombocytopenia ([place])   2. Hepatosplenomegaly      Dr. Randa Evens, MD, MPH [facility] at [facility] [phone] 06/28/2021 2:31 PM

## 2021-07-14 ENCOUNTER — Other Ambulatory Visit (HOSPITAL_COMMUNITY): Payer: Self-pay | Admitting: Gerontology

## 2021-07-14 ENCOUNTER — Ambulatory Visit: Admission: RE | Admit: 2021-07-14 | Payer: Medicare Other | Source: Ambulatory Visit

## 2021-07-14 ENCOUNTER — Other Ambulatory Visit: Payer: Self-pay | Admitting: Gerontology

## 2021-07-14 DIAGNOSIS — M79604 Pain in right leg: Secondary | ICD-10-CM

## 2021-07-14 DIAGNOSIS — I739 Peripheral vascular disease, unspecified: Secondary | ICD-10-CM

## 2021-07-18 ENCOUNTER — Other Ambulatory Visit: Payer: Self-pay

## 2021-07-18 ENCOUNTER — Ambulatory Visit
Admission: RE | Admit: 2021-07-18 | Discharge: 2021-07-18 | Disposition: A | Discharge status: Home / Self Care | Payer: Medicare Other | Source: Ambulatory Visit | Attending: Gerontology | Admitting: Gerontology

## 2021-07-18 DIAGNOSIS — M79604 Pain in right leg: Secondary | ICD-10-CM | POA: Diagnosis present

## 2021-07-18 DIAGNOSIS — I739 Peripheral vascular disease, unspecified: Secondary | ICD-10-CM | POA: Diagnosis present

## 2021-09-27 ENCOUNTER — Inpatient Hospital Stay: Payer: Medicare Other | Attending: Oncology

## 2021-09-27 ENCOUNTER — Other Ambulatory Visit: Payer: Self-pay

## 2021-09-27 DIAGNOSIS — D696 Thrombocytopenia, unspecified: Secondary | ICD-10-CM | POA: Diagnosis present

## 2021-09-27 DIAGNOSIS — R162 Hepatomegaly with splenomegaly, not elsewhere classified: Secondary | ICD-10-CM

## 2021-09-27 LAB — COMPREHENSIVE METABOLIC PANEL
ALT: 37 U/L (ref 0–44)
AST: 34 U/L (ref 15–41)
Albumin: 4.3 g/dL (ref 3.5–5.0)
Alkaline Phosphatase: 42 U/L (ref 38–126)
Anion gap: 8 (ref 5–15)
BUN: 12 mg/dL (ref 6–20)
CO2: 31 mmol/L (ref 22–32)
Calcium: 9.4 mg/dL (ref 8.9–10.3)
Chloride: 98 mmol/L (ref 98–111)
Creatinine, Ser: 0.89 mg/dL (ref 0.61–1.24)
GFR, Estimated: >60 mL/min (ref >60)
Glucose, Bld: 104 mg/dL — ABNORMAL HIGH (ref 70–99)
Potassium: 4.1 mmol/L (ref 3.5–5.1)
Sodium: 137 mmol/L (ref 135–145)
Total Bilirubin: 0.7 mg/dL (ref 0.3–1.2)
Total Protein: 8.1 g/dL (ref 6.5–8.1)

## 2021-09-27 LAB — CBC WITH DIFFERENTIAL/PLATELET
Abs Immature Granulocytes: 0.03 10*3/uL (ref 0.00–0.07)
Basophils Absolute: 0 10*3/uL (ref 0.0–0.1)
Basophils Relative: 1 %
Eosinophils Absolute: 0.1 10*3/uL (ref 0.0–0.5)
Eosinophils Relative: 4 %
HCT: 47.8 % (ref 39.0–52.0)
Hemoglobin: 16.4 g/dL (ref 13.0–17.0)
Immature Granulocytes: 1 %
Lymphocytes Relative: 22 %
Lymphs Abs: 0.8 10*3/uL (ref 0.7–4.0)
MCH: 32.5 pg (ref 26.0–34.0)
MCHC: 34.3 g/dL (ref 30.0–36.0)
MCV: 94.7 fL (ref 80.0–100.0)
Monocytes Absolute: 0.4 10*3/uL (ref 0.1–1.0)
Monocytes Relative: 11 %
Neutro Abs: 2.4 10*3/uL (ref 1.7–7.7)
Neutrophils Relative %: 61 %
Platelets: 98 10*3/uL — ABNORMAL LOW (ref 150–400)
RBC: 5.05 MIL/uL (ref 4.22–5.81)
RDW: 12.5 % (ref 11.5–15.5)
WBC: 3.8 10*3/uL — ABNORMAL LOW (ref 4.0–10.5)
nRBC: 0 % (ref 0.0–0.2)

## 2021-11-14 ENCOUNTER — Other Ambulatory Visit: Payer: Self-pay

## 2021-11-17 ENCOUNTER — Ambulatory Visit
Admission: EM | Admit: 2021-11-17 | Discharge: 2021-11-17 | Disposition: A | Discharge status: Home / Self Care | Payer: Medicare Other | Attending: Emergency Medicine | Admitting: Emergency Medicine

## 2021-11-17 ENCOUNTER — Ambulatory Visit (INDEPENDENT_AMBULATORY_CARE_PROVIDER_SITE_OTHER): Payer: Medicare Other

## 2021-11-17 ENCOUNTER — Other Ambulatory Visit: Payer: Self-pay

## 2021-11-17 VITALS — BP 120/60 | HR 87 | Temp 98.7°F | Resp 18 | Ht 70.0 in | Wt 260.0 lb

## 2021-11-17 DIAGNOSIS — M25559 Pain in unspecified hip: Secondary | ICD-10-CM

## 2021-11-17 DIAGNOSIS — M76892 Other specified enthesopathies of left lower limb, excluding foot: Secondary | ICD-10-CM | POA: Diagnosis not present

## 2021-11-17 DIAGNOSIS — M25552 Pain in left hip: Secondary | ICD-10-CM | POA: Diagnosis not present

## 2021-11-17 MED ORDER — MELOXICAM 15 MG PO TABS: 15.0000 mg | ORAL_TABLET | Freq: Every day | ORAL | 0 refills | Status: DC

## 2021-11-17 NOTE — ED Provider Notes (Signed)
[facility]  ____________________________________________  Time seen: Approximately 9:33 AM  I have reviewed the triage vital signs and the nursing notes.   HISTORY  Chief Complaint Groin Pain (Appointment)    HPI Ethan Alvarez is a 60 y.o. male who presents to the emergency department complaining of left hip pain.  This is been off and on x2 months.  Started after patient had 2 months of physical therapy.  Primarily localized to the front portion.  No groin pain.  No urinary changes.  No complaints of testicular pain.  No limping.  No loss of range of motion to the hip or lower extremity.  Symptoms typically improve with ibuprofen       Past Medical History:  Diagnosis Date   Hypertension    Schizophrenia [facility](HCC)     Patient Active Problem List   Diagnosis Date Noted   Thrombocytopenia (HCC) 04/24/2021   Anxiety 04/24/2021   OCD (obsessive compulsive disorder) 04/24/2021   Rosacea 04/24/2021   Dehydration    Altered mental status    Sepsis without acute organ dysfunction (HCC)    COVID-19 virus infection 12/30/2020   Generalized weakness    Schizophrenia (HCC)    Obesity (BMI 30.0-34.9) 04/23/2020   Aggressive behavior of adult 09/25/2019   Suicidal ideation 09/25/2019   Fall 09/14/2019   MSSA bacteremia 07/09/2019   Septic arthritis (HCC) 07/09/2019   Cellulitis of right knee 06/01/2019   Rupture of right quadriceps tendon 04/25/2019   Tremor 02/12/2019   Abnormal TSH 02/11/2018   Vitamin D deficiency 02/11/2018   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   Essential hypertension 03/14/2017   Pancreatitis 03/14/2017   Heart palpitations 02/26/2017   SOB (shortness of breath) 02/26/2017   High risk medication use 02/13/2016   History of adenomatous polyp of colon 10/07/2014   Hyperprolactinemia (HCC) 10/07/2014    Past Surgical History:  Procedure Laterality Date   INCISION AND DRAINAGE Right 06/02/2019   Procedure: INCISION AND DRAINAGE;   Surgeon: Christena FlakePoggi, John J, MD;  Location: [facility] ORS;  Service: Orthopedics;  Laterality: Right;   QUADRICEPS TENDON REPAIR Right 04/25/2019   Procedure: REPAIR QUADRICEP TENDON;  Surgeon: Christena FlakePoggi, John J, MD;  Location: [facility] ORS;  Service: Orthopedics;  Laterality: Right;   QUADRICEPS TENDON REPAIR Right 06/02/2019   Procedure: REPAIR QUADRICEP TENDON;  Surgeon: Christena FlakePoggi, John J, MD;  Location: [facility] ORS;  Service: Orthopedics;  Laterality: Right;    Prior to Admission medications   Medication Sig Start Date End Date Taking? Authorizing Provider  acetaminophen (TYLENOL) 325 MG tablet Take 650 mg by mouth 2 (two) times daily.   Yes [provider]  ascorbic acid (VITAMIN C) 250 MG tablet Take by mouth.   Yes [provider]  ascorbic acid (VITAMIN C) 500 MG tablet Take 250 mg by mouth daily.   Yes [provider]  benztropine (COGENTIN) 0.5 MG tablet Take 0.5 mg by mouth every morning.    Yes [provider]  benztropine (COGENTIN) 1 MG tablet Take 1 mg by mouth 2 (two) times daily.  03/25/19  Yes [provider]  cholecalciferol (VITAMIN D3) 25 MCG (1000 UT) tablet Take 2,000 Units by mouth daily.   Yes [provider]  Cholecalciferol 25 MCG (1000 UT) tablet Take by mouth.   Yes [provider]  divalproex (DEPAKOTE) 500 MG DR tablet TAKE 2 TABLETS BY MOUTH ONCE EVERY MORNING AT 8AM AND 2 TABS EVERY AFTERNOON AT 2PM 06/12/21  Yes [provider]  LORazepam (ATIVAN) 1 MG tablet Take 2 tablets (2 mg total) by mouth in the morning, at noon, and at bedtime. Takes at 0800, 1400, 2000 12/31/20  Yes Enedina Finner, MD  LORazepam (ATIVAN) 1 MG tablet Take by mouth. 06/12/21  Yes [provider]  meloxicam (MOBIC) 15 MG tablet Take 1 tablet (15 mg total) by mouth daily. 11/17/21  Yes Aryannah Mohon, Delorise Royals, PA-C  OLANZapine (ZYPREXA) 5 MG tablet Take 5 mg by mouth See admin instructions. 5 mg 0800 and 1400   Yes [provider]   paliperidone (INVEGA) 6 MG 24 hr tablet Take 6 mg by mouth daily.   Yes [provider]  paliperidone (INVEGA) 9 MG 24 hr tablet Take 9 mg by mouth at bedtime.   Yes [provider]  propranolol (INDERAL) 20 MG tablet Take 20 mg by mouth 2 (two) times daily. 08/12/18  Yes [provider]  sertraline (ZOLOFT) 100 MG tablet Take 1 tablet by mouth twice daily - at 8am AND at 8pm 06/12/21  Yes [provider]  thiamine 100 MG tablet Take by mouth.   Yes [provider]  vitamin B-12 (CYANOCOBALAMIN) 100 MCG tablet Take 100 mcg by mouth daily.   Yes [provider]  divalproex (DEPAKOTE) 500 MG DR tablet Take 1,000 mg by mouth 2 (two) times a day. 02/13/17 06/28/21  [provider]  sertraline (ZOLOFT) 100 MG tablet Take 100 mg by mouth 2 (two) times a day. 01/30/18 06/28/21  [provider]    Allergies Haloperidol lactate  History reviewed. No pertinent family history.  Social History Social History   Tobacco Use   Smoking status: Never   Smokeless tobacco: Never  Vaping Use   Vaping Use: Never used  Substance Use Topics   Alcohol use: Not Currently   Drug use: Not Currently     Review of Systems  Constitutional: No fever/chills Eyes: No visual changes. No discharge ENT: No upper respiratory complaints. Cardiovascular: no chest pain. Respiratory: no cough. No SOB. Gastrointestinal: No abdominal pain.  No nausea, no vomiting.  No diarrhea.  No constipation. Genitourinary: Negative for dysuria. No hematuria Musculoskeletal: Positive for left hip pain Skin: Negative for rash, abrasions, lacerations, ecchymosis. Neurological: Negative for headaches, focal weakness or numbness.  10 System ROS otherwise negative.  ____________________________________________   PHYSICAL EXAM:  VITAL SIGNS: ED Triage Vitals  Enc Vitals Group     BP 11/17/21 0912 120/60     Pulse Rate 11/17/21 0912 87     Resp 11/17/21 0912  18     Temp 11/17/21 0912 98.7 F (37.1 C)     Temp Source 11/17/21 0912 Oral     SpO2 11/17/21 0912 97 %     Weight 11/17/21 0911 260 lb (117.9 kg)     Height 11/17/21 0911 5\' 10"  (1.778 m)     Head Circumference --      Peak Flow --      Pain Score 11/17/21 0911 0     Pain Loc --      Pain Edu? --      Excl. in GC? --      Constitutional: Alert and oriented. Well appearing and in no acute distress. Eyes: Conjunctivae are normal. PERRL. EOMI. Head: Atraumatic. ENT:      Ears:       Nose: No congestion/rhinnorhea.      Mouth/Throat: Mucous membranes are moist.  Neck: No stridor.    Cardiovascular: Normal rate, regular rhythm.  Normal S1 and S2.  Good peripheral circulation. Respiratory: Normal respiratory effort without tachypnea or retractions. Lungs CTAB. Good air entry to the bases with no decreased or absent breath sounds. Musculoskeletal: Full range of motion to all extremities. No gross deformities appreciated.  No shortening or rotation of the left lower leg.  No edema, ecchymosis, signs of open wounds to the left hip.  Patient is tender over the anterior iliac crest.  Point specific tender at this area.  No other tenderness to the left hip or groin.  No palpable abnormality.  Dorsalis pedis pulses sensation intact distally. Neurologic:  Normal speech and language. No gross focal neurologic deficits are appreciated.  Skin:  Skin is warm, dry and intact. No rash noted. Psychiatric: Mood and affect are normal. Speech and behavior are normal. Patient exhibits appropriate insight and judgement.   ____________________________________________   LABS (all labs ordered are listed, but only abnormal results are displayed)  Labs Reviewed - No data to display ____________________________________________  EKG   ____________________________________________  RADIOLOGY I personally viewed and evaluated these images as part of my medical decision making, as well as reviewing the  written report by the radiologist.  ED Provider Interpretation: No acute osseous abnormality identified on x-ray  DG Hip Unilat With Pelvis 2-3 Views Left  Result Date: 11/17/2021 CLINICAL DATA:  Left groin pain. EXAM: DG HIP (WITH OR WITHOUT PELVIS) 2-3V LEFT COMPARISON:  None. FINDINGS: There is no evidence of hip fracture or dislocation. There is no evidence of arthropathy. Bilateral coxa profunda. Nonobstructive bowel gas pattern in the visualized lower abdomen. IMPRESSION: No acute osseous abnormality of the left hip. Electronically Signed   By: Sherron Ales M.D.   On: 11/17/2021 10:25    ____________________________________________    PROCEDURES  Procedure(s) performed:    Procedures    Medications - No data to display   ____________________________________________   INITIAL IMPRESSION / ASSESSMENT AND PLAN / ED COURSE  Pertinent labs & imaging results that were available during my care of the patient were reviewed by me and considered in my medical decision making (see chart for details).  Review of the [place] CSRS was performed in accordance of the [facility] prior to dispensing any controlled drugs.           Patient's diagnosis is consistent with hip flexor tendinitis.  Patient presents emergency department with intermittent left hip pain.  This is primarily pain described over the anterior iliac crest.  Point specific tender in this area.  Imaging was reassuring at this time.  This typically is worse after activity, improves with rest or Motrin.  Patiently placed on anti-inflammatory this time.  If symptoms do not improve, follow-up primary care or orthopedics..  Patient is given ED precautions to return to the ED for any worsening or new symptoms.     ____________________________________________  FINAL CLINICAL IMPRESSION(S) / DIAGNOSES  Final diagnoses:  Hip pain  Tendinitis of left hip flexor      NEW MEDICATIONS STARTED DURING THIS VISIT:  ED Discharge  Orders          Ordered    meloxicam (MOBIC) 15 MG tablet  Daily        11/17/21 1037                This chart was dictated using voice recognition software/Dragon. Despite best efforts to proofread, errors can occur which can change the meaning. Any change was purely unintentional.    Racheal Patches, PA-C 11/17/21 1039

## 2021-11-17 NOTE — ED Triage Notes (Signed)
Pt here with C/O left groin pain. Was in physical therapy and was told that it would be painful. Also both feet are swollen and his CNA who is here with him states that his feet swell all the time. Has an appointment with his PCP in 3 weeks.

## 2021-11-20 ENCOUNTER — Ambulatory Visit: Payer: Medicare Other

## 2022-01-03 ENCOUNTER — Other Ambulatory Visit: Payer: Medicare Other

## 2022-01-03 ENCOUNTER — Ambulatory Visit: Payer: Medicare Other | Admitting: Oncology

## 2022-01-15 ENCOUNTER — Telehealth: Payer: Self-pay | Admitting: Oncology

## 2022-01-15 NOTE — Telephone Encounter (Signed)
Wife called to reschedule pt's appt for 2-1. Call back at [phone]

## 2022-01-16 NOTE — Telephone Encounter (Signed)
Multiple attempts made to reach patient's caregiver yesterday and today with no success. Left VM with direct extension.

## 2022-01-17 ENCOUNTER — Inpatient Hospital Stay: Payer: Medicare Other | Admitting: Oncology

## 2022-01-17 ENCOUNTER — Telehealth: Payer: Self-pay | Admitting: Oncology

## 2022-01-17 ENCOUNTER — Inpatient Hospital Stay: Payer: Medicare Other

## 2022-01-17 NOTE — Telephone Encounter (Signed)
Spoke with caregiver. Patient has been rescheduled with transport on 2/6.

## 2022-01-17 NOTE — Telephone Encounter (Signed)
Caregiver called to cancel pt's appt for today. Call back to reschedule at [phone]

## 2022-01-17 NOTE — Telephone Encounter (Signed)
Ethan Alvarez came up to ask [place] to r/s because they need wheelchair bus to bring him

## 2022-01-17 NOTE — Telephone Encounter (Signed)
Victorino Dike called the caregiver and made new appt and worked out transportation

## 2022-01-22 ENCOUNTER — Inpatient Hospital Stay: Payer: Medicare Other | Attending: Oncology

## 2022-01-22 ENCOUNTER — Inpatient Hospital Stay (HOSPITAL_BASED_OUTPATIENT_CLINIC_OR_DEPARTMENT_OTHER): Payer: Medicare Other | Admitting: Nurse Practitioner

## 2022-01-22 ENCOUNTER — Inpatient Hospital Stay: Payer: Medicare Other

## 2022-01-22 ENCOUNTER — Encounter: Payer: Self-pay | Admitting: Nurse Practitioner

## 2022-01-22 ENCOUNTER — Other Ambulatory Visit: Payer: Self-pay

## 2022-01-22 VITALS — BP 128/70 | HR 91 | Temp 96.6°F | Resp 16 | Wt 244.1 lb

## 2022-01-22 DIAGNOSIS — F209 Schizophrenia, unspecified: Secondary | ICD-10-CM | POA: Diagnosis not present

## 2022-01-22 DIAGNOSIS — K760 Fatty (change of) liver, not elsewhere classified: Secondary | ICD-10-CM | POA: Diagnosis not present

## 2022-01-22 DIAGNOSIS — D696 Thrombocytopenia, unspecified: Secondary | ICD-10-CM | POA: Insufficient documentation

## 2022-01-22 DIAGNOSIS — R1032 Left lower quadrant pain: Secondary | ICD-10-CM | POA: Insufficient documentation

## 2022-01-22 DIAGNOSIS — Z79899 Other long term (current) drug therapy: Secondary | ICD-10-CM | POA: Insufficient documentation

## 2022-01-22 DIAGNOSIS — R162 Hepatomegaly with splenomegaly, not elsewhere classified: Secondary | ICD-10-CM | POA: Diagnosis not present

## 2022-01-22 DIAGNOSIS — I10 Essential (primary) hypertension: Secondary | ICD-10-CM | POA: Insufficient documentation

## 2022-01-22 LAB — COMPREHENSIVE METABOLIC PANEL
ALT: 34 U/L (ref 0–44)
AST: 35 U/L (ref 15–41)
Albumin: 4.1 g/dL (ref 3.5–5.0)
Alkaline Phosphatase: 50 U/L (ref 38–126)
Anion gap: 8 (ref 5–15)
BUN: 14 mg/dL (ref 6–20)
CO2: 30 mmol/L (ref 22–32)
Calcium: 9 mg/dL (ref 8.9–10.3)
Chloride: 98 mmol/L (ref 98–111)
Creatinine, Ser: 0.85 mg/dL (ref 0.61–1.24)
GFR, Estimated: >60 mL/min (ref >60)
Glucose, Bld: 119 mg/dL — ABNORMAL HIGH (ref 70–99)
Potassium: 4.1 mmol/L (ref 3.5–5.1)
Sodium: 136 mmol/L (ref 135–145)
Total Bilirubin: 0.5 mg/dL (ref 0.3–1.2)
Total Protein: 7.7 g/dL (ref 6.5–8.1)

## 2022-01-22 LAB — CBC WITH DIFFERENTIAL/PLATELET
Abs Immature Granulocytes: 0.05 10*3/uL (ref 0.00–0.07)
Basophils Absolute: 0 10*3/uL (ref 0.0–0.1)
Basophils Relative: 1 %
Eosinophils Absolute: 0.1 10*3/uL (ref 0.0–0.5)
Eosinophils Relative: 4 %
HCT: 46.5 % (ref 39.0–52.0)
Hemoglobin: 16 g/dL (ref 13.0–17.0)
Immature Granulocytes: 2 %
Lymphocytes Relative: 25 %
Lymphs Abs: 0.8 10*3/uL (ref 0.7–4.0)
MCH: 32.9 pg (ref 26.0–34.0)
MCHC: 34.4 g/dL (ref 30.0–36.0)
MCV: 95.7 fL (ref 80.0–100.0)
Monocytes Absolute: 0.3 10*3/uL (ref 0.1–1.0)
Monocytes Relative: 9 %
Neutro Abs: 2 10*3/uL (ref 1.7–7.7)
Neutrophils Relative %: 59 %
Platelets: 86 10*3/uL — ABNORMAL LOW (ref 150–400)
RBC: 4.86 MIL/uL (ref 4.22–5.81)
RDW: 12.7 % (ref 11.5–15.5)
WBC: 3.4 10*3/uL — ABNORMAL LOW (ref 4.0–10.5)
nRBC: 0 % (ref 0.0–0.2)

## 2022-01-22 NOTE — Addendum Note (Signed)
Addended by: Alinda Dooms on: 01/22/2022 10:05 AM   Modules accepted: Orders

## 2022-01-22 NOTE — Progress Notes (Signed)
Hematology/Oncology Consult Note [facility]  Telephone:([phone] [phone]  Patient Care Team: Latanya Maudlin, NP as PCP - General (Family Medicine) Sindy Guadeloupe, MD as Consulting Physician (Hematology and Oncology)   Name of the patient: Ethan Alvarez  DI:[id number]  09/18/1961   Date of visit: 01/22/22  Diagnosis-thrombocytopenia possibly secondary to splenomegaly Hepatosplenomegaly of unclear etiology  Chief complaint/ Reason for visit-routine follow-up of splenomegaly  Heme/Onc history: Patient is a 61 year old male with a past medical history significant for schizophrenia who has been seeing [place] for chronic thrombocytopenia.  Patient unable to make any of his own decisions and his brother is his primary Media planner.  He is cared for by a CMA for the last 3 years.  Patient noted to have chronic hepatosplenomegaly on his prior CTA since 2020.  Liver function tests have been normal and there was evidence of fatty liver but no overt cirrhosis.  HIV hepatitis C B12 folate copper and iron studies were normal.  Interval history- Patient is unable to provide history. He is accompanied by his caregiver. He has ongoing left groin pain which is chronic and unchanged. He starts physical therapy for this soon. No specific complaints today. No reports of rectal bleeding.   ECOG PS- 2 Pain scale- 0   Review of systems- Review of Systems  Unable to perform ROS: Psychiatric disorder     Allergies  Allergen Reactions   Haloperidol Lactate Other (See Comments)    Per [facility]; nature of intolerance unclear intolerance Other reaction(s): Other (See Comments) Per [facility]; nature of intolerance unclear      Past Medical History:  Diagnosis Date   Hypertension    Schizophrenia [facility])      Past Surgical History:  Procedure Laterality Date   INCISION AND DRAINAGE Right 06/02/2019   Procedure: INCISION AND DRAINAGE;  Surgeon: Corky Mull, MD;  Location:  [facility] ORS;  Service: Orthopedics;  Laterality: Right;   QUADRICEPS TENDON REPAIR Right 04/25/2019   Procedure: REPAIR QUADRICEP TENDON;  Surgeon: Corky Mull, MD;  Location: [facility] ORS;  Service: Orthopedics;  Laterality: Right;   QUADRICEPS TENDON REPAIR Right 06/02/2019   Procedure: REPAIR QUADRICEP TENDON;  Surgeon: Corky Mull, MD;  Location: [facility] ORS;  Service: Orthopedics;  Laterality: Right;    Social History   Socioeconomic History   Marital status: Single    Spouse name: Not on file   Number of children: Not on file   Years of education: Not on file   Highest education level: Not on file  Occupational History   Not on file  Tobacco Use   Smoking status: Never   Smokeless tobacco: Never  Vaping Use   Vaping Use: Never used  Substance and Sexual Activity   Alcohol use: Not Currently   Drug use: Not Currently   Sexual activity: Not Currently  Other Topics Concern   Not on file  Social History Narrative   Not on file   Social Determinants of Health   Financial Resource Strain: Not on file  Food Insecurity: Not on file  Transportation Needs: Not on file  Physical Activity: Not on file  Stress: Not on file  Social Connections: Not on file  Intimate Partner Violence: Not on file    No family history on file.   Current Outpatient Medications:    acetaminophen (TYLENOL) 325 MG tablet, Take 650 mg by mouth 2 (two) times daily., Disp: , Rfl:    ascorbic acid (VITAMIN C) 250  MG tablet, Take by mouth., Disp: , Rfl:    ascorbic acid (VITAMIN C) 500 MG tablet, Take 250 mg by mouth daily., Disp: , Rfl:    benztropine (COGENTIN) 0.5 MG tablet, Take 0.5 mg by mouth every morning. , Disp: , Rfl:    benztropine (COGENTIN) 1 MG tablet, Take 1 mg by mouth 2 (two) times daily. , Disp: , Rfl:    cholecalciferol (VITAMIN D3) 25 MCG (1000 UT) tablet, Take 2,000 Units by mouth daily., Disp: , Rfl:    Cholecalciferol 25 MCG (1000 UT) tablet, Take by mouth., Disp: , Rfl:    divalproex  (DEPAKOTE) 500 MG DR tablet, TAKE 2 TABLETS BY MOUTH ONCE EVERY MORNING AT 8AM AND 2 TABS EVERY AFTERNOON AT 2PM, Disp: , Rfl:    LORazepam (ATIVAN) 1 MG tablet, Take 2 tablets (2 mg total) by mouth in the morning, at noon, and at bedtime. Takes at 0800, 1400, 2000, Disp: 10 tablet, Rfl: 0   LORazepam (ATIVAN) 1 MG tablet, Take by mouth., Disp: , Rfl:    meloxicam (MOBIC) 15 MG tablet, Take 1 tablet (15 mg total) by mouth daily., Disp: 30 tablet, Rfl: 0   OLANZapine (ZYPREXA) 5 MG tablet, Take 5 mg by mouth See admin instructions. 5 mg 0800 and 1400, Disp: , Rfl:    paliperidone (INVEGA) 6 MG 24 hr tablet, Take 6 mg by mouth daily., Disp: , Rfl:    paliperidone (INVEGA) 9 MG 24 hr tablet, Take 9 mg by mouth at bedtime., Disp: , Rfl:    propranolol (INDERAL) 20 MG tablet, Take 20 mg by mouth 2 (two) times daily., Disp: , Rfl:    sertraline (ZOLOFT) 100 MG tablet, Take 1 tablet by mouth twice daily - at 8am AND at 8pm, Disp: , Rfl:    thiamine 100 MG tablet, Take by mouth., Disp: , Rfl:    vitamin B-12 (CYANOCOBALAMIN) 100 MCG tablet, Take 100 mcg by mouth daily., Disp: , Rfl:    divalproex (DEPAKOTE) 500 MG DR tablet, Take 1,000 mg by mouth 2 (two) times a day., Disp: , Rfl:    sertraline (ZOLOFT) 100 MG tablet, Take 100 mg by mouth 2 (two) times a day., Disp: , Rfl:   Physical exam:  Vitals:   01/22/22 0915  BP: 128/70  Pulse: 91  Resp: 16  Temp: (!) 96.6 F (35.9 C)  TempSrc: Tympanic  SpO2: 97%  Weight: 244 lb 1.6 oz (110.7 kg)   Physical Exam Constitutional:      Appearance: He is obese.     Comments: Shuffled gait  Cardiovascular:     Rate and Rhythm: Normal rate and regular rhythm.     Heart sounds: Normal heart sounds.  Pulmonary:     Effort: Pulmonary effort is normal.     Breath sounds: Normal breath sounds.  Abdominal:     General: Bowel sounds are normal.     Palpations: Abdomen is soft.  Musculoskeletal:     Cervical back: Normal range of motion.  Skin:     General: Skin is warm and dry.  Neurological:     Mental Status: He is alert and oriented to person, place, and time.     CMP Latest Ref Rng & Units 09/27/2021  Glucose 70 - 99 mg/dL 104(H)  BUN 6 - 20 mg/dL 12  Creatinine 0.61 - 1.24 mg/dL 0.89  Sodium 135 - 145 mmol/L 137  Potassium 3.5 - 5.1 mmol/L 4.1  Chloride 98 - 111 mmol/L 98  CO2 22 - 32 mmol/L 31  Calcium 8.9 - 10.3 mg/dL 9.4  Total Protein 6.5 - 8.1 g/dL 8.1  Total Bilirubin 0.3 - 1.2 mg/dL 0.7  Alkaline Phos 38 - 126 U/L 42  AST 15 - 41 U/L 34  ALT 0 - 44 U/L 37   CBC Latest Ref Rng & Units 09/27/2021  WBC 4.0 - 10.5 K/uL 3.8(L)  Hemoglobin 13.0 - 17.0 g/dL 16.4  Hematocrit 39.0 - 52.0 % 47.8  Platelets 150 - 400 K/uL 98(L)     Assessment and plan- Patient is a 61 y.o. male here for routine follow-up of thrombocytopenia  Thrombocytopenia: suspect related to underlying hepatosplenomegaly. Counts are stable and no need for bone marrow or treatment at this time. Previous workup was either negative or unremarkable. Plt is 86,000 today. Monitor.   Hepatosplenomegaly: previous ultrasound from May 2022 revealed fatty liver disease and splenomegaly. Etiology of which is unclear. His LFTs continue to be normal and I recommend continued conservative monitoring. He did not have interval ultrasound performed so I will have that scheduled today. Caregivers declined screening colonoscopy at this time which is reasonable as there continues to be no evidence of iron deficiency.   Follow up: Abdominal ultrasound 6 months- labs (cbc, cmp), Janese Banks- la    Visit Diagnosis 1. Thrombocytopenia ([place])   2. Hepatosplenomegaly     Beckey Rutter, DNP, AGNP-C [place] at [facility] [phone] (clinic) 01/22/2022

## 2022-03-22 ENCOUNTER — Encounter (INDEPENDENT_AMBULATORY_CARE_PROVIDER_SITE_OTHER): Payer: Self-pay | Admitting: Nurse Practitioner

## 2022-03-22 ENCOUNTER — Encounter (INDEPENDENT_AMBULATORY_CARE_PROVIDER_SITE_OTHER): Payer: Self-pay

## 2022-03-28 ENCOUNTER — Encounter (INDEPENDENT_AMBULATORY_CARE_PROVIDER_SITE_OTHER): Payer: Self-pay

## 2022-03-28 ENCOUNTER — Encounter (INDEPENDENT_AMBULATORY_CARE_PROVIDER_SITE_OTHER): Payer: Self-pay | Admitting: Nurse Practitioner

## 2022-04-18 ENCOUNTER — Ambulatory Visit
Admission: RE | Admit: 2022-04-18 | Discharge: 2022-04-18 | Disposition: A | Discharge status: Home / Self Care | Payer: Medicare Other | Source: Ambulatory Visit | Attending: Nurse Practitioner | Admitting: Nurse Practitioner

## 2022-04-18 DIAGNOSIS — D696 Thrombocytopenia, unspecified: Secondary | ICD-10-CM | POA: Diagnosis not present

## 2022-04-18 DIAGNOSIS — R162 Hepatomegaly with splenomegaly, not elsewhere classified: Secondary | ICD-10-CM | POA: Insufficient documentation

## 2022-04-23 ENCOUNTER — Encounter (INDEPENDENT_AMBULATORY_CARE_PROVIDER_SITE_OTHER): Payer: Self-pay | Admitting: Nurse Practitioner

## 2022-04-23 ENCOUNTER — Encounter (INDEPENDENT_AMBULATORY_CARE_PROVIDER_SITE_OTHER): Payer: Self-pay

## 2022-08-02 ENCOUNTER — Other Ambulatory Visit: Payer: Self-pay

## 2022-08-02 DIAGNOSIS — D696 Thrombocytopenia, unspecified: Secondary | ICD-10-CM

## 2022-08-06 ENCOUNTER — Inpatient Hospital Stay: Payer: Medicare Other | Admitting: Oncology

## 2022-08-06 ENCOUNTER — Telehealth: Payer: Self-pay | Admitting: Oncology

## 2022-08-06 ENCOUNTER — Inpatient Hospital Stay: Payer: Medicare Other

## 2022-08-06 NOTE — Telephone Encounter (Signed)
Patient's caregiver called to report that patient is unable to come to his appointment today. Attempt made to reach patient x 2 with no answer or option to leave a VM to provide updated appointment info.

## 2022-09-12 ENCOUNTER — Inpatient Hospital Stay: Payer: Medicare Other

## 2022-09-12 ENCOUNTER — Inpatient Hospital Stay: Payer: Medicare Other | Admitting: Oncology

## 2022-09-16 ENCOUNTER — Ambulatory Visit
Admission: EM | Admit: 2022-09-16 | Discharge: 2022-09-16 | Disposition: A | Discharge status: Home / Self Care | Payer: Medicare Other | Attending: Internal Medicine | Admitting: Internal Medicine

## 2022-09-16 DIAGNOSIS — R82998 Other abnormal findings in urine: Secondary | ICD-10-CM | POA: Diagnosis present

## 2022-09-16 DIAGNOSIS — R051 Acute cough: Secondary | ICD-10-CM | POA: Diagnosis present

## 2022-09-16 DIAGNOSIS — R8290 Unspecified abnormal findings in urine: Secondary | ICD-10-CM | POA: Diagnosis present

## 2022-09-16 LAB — URINALYSIS, ROUTINE W REFLEX MICROSCOPIC
Bilirubin Urine: NEGATIVE
Glucose, UA: NEGATIVE mg/dL
Hgb urine dipstick: NEGATIVE
Ketones, ur: NEGATIVE mg/dL
Leukocytes,Ua: NEGATIVE
Nitrite: NEGATIVE
Protein, ur: NEGATIVE mg/dL
Specific Gravity, Urine: 1.02 (ref 1.005–1.030)
pH: 7 (ref 5.0–8.0)

## 2022-09-16 NOTE — Discharge Instructions (Addendum)
You were seen today for urine odor, dark urine and a cough.  Your urinalysis did not show any evidence of infection.  This is indication that you do not drink enough water.  Please increase this.  You have drainage in the back your throat which is causing your cough.  Please give him Claritin 10 mg daily.  Please follow-up with your PCP if symptoms persist or worsen.

## 2022-09-16 NOTE — ED Provider Notes (Signed)
[facility]    CSN: [id number] Arrival date & time: 09/16/22  1059      History   Chief Complaint Chief Complaint  Patient presents with   Cough   Urinary Tract Infection    HPI Ethan Alvarez is a 61 y.o. male with a history of HTN, intellectual functional limitation and schizophrenia presents to the urgent care today with complaint of dark urine and odor.  He reports this started 2 days ago.  He denies urgency, frequency, dysuria or blood in his urine.  He denies fever, chills, nausea, vomiting or low back pain.  He has not tried anything OTC for this.  His caretaker also reports that he has a cough.  The cough appears to be random.  Patient denies cough but reports he intermittently does get stuff in the back of his throat.  Caretaker denies runny nose, nasal congestion, ear pain, sore throat, shortness of breath.  She denies fever, chills or body aches.  She has not given him anything OTC for this cough.  He has not had sick contacts that she is aware of.  HPI  Past Medical History:  Diagnosis Date   Hypertension    Schizophrenia [facility](HCC)     Patient Active Problem List   Diagnosis Date Noted   Thrombocytopenia (HCC) 04/24/2021   Anxiety 04/24/2021   OCD (obsessive compulsive disorder) 04/24/2021   Rosacea 04/24/2021   Dehydration    Altered mental status    Sepsis without acute organ dysfunction (HCC)    COVID-19 virus infection 12/30/2020   Generalized weakness    Schizophrenia (HCC)    Obesity (BMI 30.0-34.9) 04/23/2020   Aggressive behavior of adult 09/25/2019   Suicidal ideation 09/25/2019   Fall 09/14/2019   MSSA bacteremia 07/09/2019   Septic arthritis (HCC) 07/09/2019   Cellulitis of right knee 06/01/2019   Rupture of right quadriceps tendon 04/25/2019   Tremor 02/12/2019   Abnormal TSH 02/11/2018   Vitamin D deficiency 02/11/2018   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   Essential hypertension 03/14/2017   Pancreatitis 03/14/2017    Heart palpitations 02/26/2017   SOB (shortness of breath) 02/26/2017   High risk medication use 02/13/2016   History of adenomatous polyp of colon 10/07/2014   Hyperprolactinemia (HCC) 10/07/2014    Past Surgical History:  Procedure Laterality Date   INCISION AND DRAINAGE Right 06/02/2019   Procedure: INCISION AND DRAINAGE;  Surgeon: Christena FlakePoggi, John J, MD;  Location: [facility] ORS;  Service: Orthopedics;  Laterality: Right;   QUADRICEPS TENDON REPAIR Right 04/25/2019   Procedure: REPAIR QUADRICEP TENDON;  Surgeon: Christena FlakePoggi, John J, MD;  Location: [facility] ORS;  Service: Orthopedics;  Laterality: Right;   QUADRICEPS TENDON REPAIR Right 06/02/2019   Procedure: REPAIR QUADRICEP TENDON;  Surgeon: Christena FlakePoggi, John J, MD;  Location: [facility] ORS;  Service: Orthopedics;  Laterality: Right;       Home Medications    Prior to Admission medications   Medication Sig Start Date End Date Taking? Authorizing Provider  acetaminophen (TYLENOL) 325 MG tablet Take 650 mg by mouth 2 (two) times daily.   Yes [provider]  ascorbic acid (VITAMIN C) 250 MG tablet Take by mouth.   Yes [provider]  ascorbic acid (VITAMIN C) 500 MG tablet Take 250 mg by mouth daily.   Yes [provider]  benztropine (COGENTIN) 0.5 MG tablet Take 0.5 mg by mouth every morning.    Yes [provider]  benztropine (COGENTIN) 1 MG tablet Take 1 mg by  mouth 2 (two) times daily.  03/25/19  Yes [provider]  cholecalciferol (VITAMIN D3) 25 MCG (1000 UT) tablet Take 2,000 Units by mouth daily.   Yes [provider]  Cholecalciferol 25 MCG (1000 UT) tablet Take by mouth.   Yes [provider]  divalproex (DEPAKOTE) 500 MG DR tablet TAKE 2 TABLETS BY MOUTH ONCE EVERY MORNING AT 8AM AND 2 TABS EVERY AFTERNOON AT 2PM 06/12/21  Yes [provider]  LORazepam (ATIVAN) 1 MG tablet Take 2 tablets (2 mg total) by mouth in the morning, at noon, and at bedtime. Takes at 0800, 1400, 2000  12/31/20  Yes Enedina Finner, MD  LORazepam (ATIVAN) 1 MG tablet Take by mouth. 06/12/21  Yes [provider]  OLANZapine (ZYPREXA) 5 MG tablet Take 5 mg by mouth See admin instructions. 5 mg 0800 and 1400   Yes [provider]  paliperidone (INVEGA) 6 MG 24 hr tablet Take 6 mg by mouth daily.   Yes [provider]  paliperidone (INVEGA) 9 MG 24 hr tablet Take 9 mg by mouth at bedtime.   Yes [provider]  propranolol (INDERAL) 20 MG tablet Take 20 mg by mouth 2 (two) times daily. 08/12/18  Yes [provider]  sertraline (ZOLOFT) 100 MG tablet Take 1 tablet by mouth twice daily - at 8am AND at 8pm 06/12/21  Yes [provider]  thiamine 100 MG tablet Take by mouth.   Yes [provider]  vitamin B-12 (CYANOCOBALAMIN) 100 MCG tablet Take 100 mcg by mouth daily.   Yes [provider]  divalproex (DEPAKOTE) 500 MG DR tablet Take 1,000 mg by mouth 2 (two) times a day. 02/13/17 06/28/21  [provider]  meloxicam (MOBIC) 15 MG tablet Take 1 tablet (15 mg total) by mouth daily. 11/17/21   Cuthriell, Delorise Royals, PA-C  sertraline (ZOLOFT) 100 MG tablet Take 100 mg by mouth 2 (two) times a day. 01/30/18 06/28/21  [provider]    Family History History reviewed. No pertinent family history.  Social History Social History   Tobacco Use   Smoking status: Never   Smokeless tobacco: Never  Vaping Use   Vaping Use: Never used  Substance Use Topics   Alcohol use: Not Currently   Drug use: Not Currently     Allergies   Haloperidol lactate   Review of Systems Review of Systems   Past Medical History:  Diagnosis Date   Hypertension    Schizophrenia (HCC)     No current facility-administered medications for this encounter.   Current Outpatient Medications  Medication Sig Dispense Refill   acetaminophen (TYLENOL) 325 MG tablet Take 650 mg by mouth 2 (two) times daily.     ascorbic acid (VITAMIN C) 250 MG  tablet Take by mouth.     ascorbic acid (VITAMIN C) 500 MG tablet Take 250 mg by mouth daily.     benztropine (COGENTIN) 0.5 MG tablet Take 0.5 mg by mouth every morning.      benztropine (COGENTIN) 1 MG tablet Take 1 mg by mouth 2 (two) times daily.      cholecalciferol (VITAMIN D3) 25 MCG (1000 UT) tablet Take 2,000 Units by mouth daily.     Cholecalciferol 25 MCG (1000 UT) tablet Take by mouth.     divalproex (DEPAKOTE) 500 MG DR tablet TAKE 2 TABLETS BY MOUTH ONCE EVERY MORNING AT 8AM AND 2 TABS EVERY AFTERNOON AT 2PM     LORazepam (ATIVAN) 1 MG tablet  Take 2 tablets (2 mg total) by mouth in the morning, at noon, and at bedtime. Takes at 0800, 1400, 2000 10 tablet 0   LORazepam (ATIVAN) 1 MG tablet Take by mouth.     OLANZapine (ZYPREXA) 5 MG tablet Take 5 mg by mouth See admin instructions. 5 mg 0800 and 1400     paliperidone (INVEGA) 6 MG 24 hr tablet Take 6 mg by mouth daily.     paliperidone (INVEGA) 9 MG 24 hr tablet Take 9 mg by mouth at bedtime.     propranolol (INDERAL) 20 MG tablet Take 20 mg by mouth 2 (two) times daily.     sertraline (ZOLOFT) 100 MG tablet Take 1 tablet by mouth twice daily - at 8am AND at 8pm     thiamine 100 MG tablet Take by mouth.     vitamin B-12 (CYANOCOBALAMIN) 100 MCG tablet Take 100 mcg by mouth daily.     divalproex (DEPAKOTE) 500 MG DR tablet Take 1,000 mg by mouth 2 (two) times a day.     meloxicam (MOBIC) 15 MG tablet Take 1 tablet (15 mg total) by mouth daily. 30 tablet 0   sertraline (ZOLOFT) 100 MG tablet Take 100 mg by mouth 2 (two) times a day.      Allergies  Allergen Reactions   Haloperidol Lactate Other (See Comments)    Per [facility]; nature of intolerance unclear intolerance Other reaction(s): Other (See Comments) Per [facility]; nature of intolerance unclear     History reviewed. No pertinent family history.  Social History   Socioeconomic History   Marital status: Single    Spouse name: Not on file   Number of children: Not on file    Years of education: Not on file   Highest education level: Not on file  Occupational History   Not on file  Tobacco Use   Smoking status: Never   Smokeless tobacco: Never  Vaping Use   Vaping Use: Never used  Substance and Sexual Activity   Alcohol use: Not Currently   Drug use: Not Currently   Sexual activity: Not Currently  Other Topics Concern   Not on file  Social History Narrative   Not on file   Social Determinants of Health   Financial Resource Strain: Not on file  Food Insecurity: Not on file  Transportation Needs: Not on file  Physical Activity: Not on file  Stress: Not on file  Social Connections: Not on file  Intimate Partner Violence: Not on file     Constitutional: Denies fever, malaise, fatigue, headache or abrupt weight changes.  HEENT: Denies eye pain, eye redness, ear pain, ringing in the ears, wax buildup, runny nose, nasal congestion, bloody nose, or sore throat. Respiratory: Patient reports cough.  Denies difficulty breathing, shortness of breath.   Cardiovascular: Denies chest pain, chest tightness, palpitations or swelling in the hands or feet.  Gastrointestinal: Denies abdominal pain, bloating, constipation, diarrhea or blood in the stool.  GU: Patient reports dark urine and odor.  Denies urgency, frequency, pain with urination, burning sensation, blood in urine, or discharge. Skin: Denies redness, rashes, lesions or ulcercations.  Neurological: Patient has intellectual disability. Denies dizziness, difficulty with memory, difficulty with speech or problems with balance and coordination.  Psych: Patient reports a schizophrenia, anger outburst, aggression.  Denies anxiety, SI/HI.  No other specific complaints in a complete review of systems (except as listed in HPI above).   Physical Exam Triage Vital Signs ED Triage Vitals  Enc Vitals Group  BP 09/16/22 1122 139/75     Pulse Rate 09/16/22 1122 90     Resp 09/16/22 1122 18     Temp 09/16/22  1122 97.9 F (36.6 C)     Temp Source 09/16/22 1122 Oral     SpO2 09/16/22 1122 94 %     Weight 09/16/22 1117 260 lb (117.9 kg)     Height 09/16/22 1117 5\' 10"  (1.778 m)     Head Circumference --      Peak Flow --      Pain Score 09/16/22 1116 0     Pain Loc --      Pain Edu? --      Excl. in [place]? --    No data found.  Updated Vital Signs BP 139/75 (BP Location: Left Arm)   Pulse 90   Temp 97.9 F (36.6 C) (Oral)   Resp 18   Ht 5\' 10"  (1.778 m)   Wt 260 lb (117.9 kg)   SpO2 94%   BMI 37.31 kg/m      Physical Exam  BP 139/75 (BP Location: Left Arm)   Pulse 90   Temp 97.9 F (36.6 C) (Oral)   Resp 18   Ht 5\' 10"  (1.778 m)   Wt 260 lb (117.9 kg)   SpO2 94%   BMI 37.31 kg/m  Wt Readings from Last 3 Encounters:  09/16/22 260 lb (117.9 kg)  01/22/22 244 lb 1.6 oz (110.7 kg)  11/17/21 260 lb (117.9 kg)    General: Appears his his stated age, is, in NAD. Skin: Warm, dry and intact.  HEENT: Head: normal shape and size; Eyes: sclera white, no icterus, conjunctiva pink, PERRLA and EOMs intact; Throat/Mouth: Teeth present, mucosa pink and moist, + PND, no exudate, lesions or ulcerations noted.  Neck: No adenopathy noted. Cardiovascular: Normal rate and rhythm. S1,S2 noted.  No murmur, rubs or gallops noted.  Pulmonary/Chest: Normal effort and positive vesicular breath sounds. No respiratory distress. No wheezes, rales or ronchi noted.  Abdomen: Soft and nontender. No CVA tenderness. Musculoskeletal: No difficulty with gait.  Neurological: Alert.  Intellectual disability noted. Psychiatric: He is confused, delirious and has verbal outbursts frequently.  BMET    Component Value Date/Time   NA 136 01/22/2022 0904   K 4.1 01/22/2022 0904   CL 98 01/22/2022 0904   CO2 30 01/22/2022 0904   GLUCOSE 119 (H) 01/22/2022 0904   BUN 14 01/22/2022 0904   CREATININE 0.85 01/22/2022 0904   CALCIUM 9.0 01/22/2022 0904   GFRNONAA >60 01/22/2022 0904   GFRAA >60 09/24/2019 2349     Lipid Panel  No results found for: "CHOL", "TRIG", "HDL", "CHOLHDL", "VLDL", "LDLCALC"  CBC    Component Value Date/Time   WBC 3.4 (L) 01/22/2022 0904   RBC 4.86 01/22/2022 0904   HGB 16.0 01/22/2022 0904   HCT 46.5 01/22/2022 0904   PLT 86 (L) 01/22/2022 0904   MCV 95.7 01/22/2022 0904   MCH 32.9 01/22/2022 0904   MCHC 34.4 01/22/2022 0904   RDW 12.7 01/22/2022 0904   LYMPHSABS 0.8 01/22/2022 0904   MONOABS 0.3 01/22/2022 0904   EOSABS 0.1 01/22/2022 0904   BASOSABS 0.0 01/22/2022 0904    Hgb A1C No results found for: "HGBA1C"     UC Treatments / Results  Labs Labs Reviewed  URINALYSIS, ROUTINE W REFLEX MICROSCOPIC    Initial Impression / Assessment and Plan / UC Course  I have reviewed the triage vital signs and the nursing notes.  Pertinent labs & imaging results that were available during my care of the patient were reviewed by me and considered in my medical decision making (see chart for details).   Urine Odor, Dark Urine:  DDx include dehydration, UTI, interstitial cystitis Urinalysis normal Encouraged her to drink more water  Cough:  DDx include postnasal drip, allergic rhinitis, viral URI with cough Exam consistent with postnasal drip Advised caregiver to start Loratadine 10 mg daily Follow-up with his PCP if symptoms persist or worsen  Final Clinical Impressions(s) / UC Diagnoses   Final diagnoses:  Dark urine  Abnormal urine odor  Acute cough     Discharge Instructions      You were seen today for urine odor, dark urine and a cough.  Your urinalysis did not show any evidence of infection.  This is indication that you do not drink enough water.  Please increase this.  You have drainage in the back your throat which is causing your cough.  Please give him Claritin 10 mg daily.  Please follow-up with your PCP if symptoms persist or worsen.     ED Prescriptions   None    PDMP not reviewed this encounter.   Lorre Munroe,  NP 09/16/22 1225

## 2022-09-16 NOTE — ED Triage Notes (Signed)
Pt is with his caretaker.  Pt c/o dark urine and odor x2days.  Pt caregiver states that the cough comes at random. Pt denies having a cough and states that he gets "stuff in his throat".

## 2022-09-21 ENCOUNTER — Inpatient Hospital Stay: Payer: Medicare Other

## 2022-09-21 ENCOUNTER — Inpatient Hospital Stay: Payer: Medicare Other | Admitting: Oncology

## 2022-10-15 ENCOUNTER — Encounter (INDEPENDENT_AMBULATORY_CARE_PROVIDER_SITE_OTHER): Payer: Self-pay

## 2022-10-17 ENCOUNTER — Inpatient Hospital Stay: Payer: Medicare Other | Admitting: Oncology

## 2022-10-17 ENCOUNTER — Inpatient Hospital Stay: Payer: Medicare Other

## 2022-10-24 ENCOUNTER — Inpatient Hospital Stay: Payer: Medicare Other

## 2022-10-24 ENCOUNTER — Inpatient Hospital Stay: Payer: Medicare Other | Admitting: Oncology

## 2022-11-21 ENCOUNTER — Other Ambulatory Visit: Payer: Medicare Other

## 2022-11-21 ENCOUNTER — Ambulatory Visit: Payer: Medicare Other | Admitting: Oncology

## 2022-11-26 ENCOUNTER — Inpatient Hospital Stay: Payer: Medicare Other

## 2022-11-26 ENCOUNTER — Telehealth: Payer: Self-pay | Admitting: Oncology

## 2022-11-26 ENCOUNTER — Inpatient Hospital Stay: Payer: Medicare Other | Admitting: Oncology

## 2022-11-26 NOTE — Telephone Encounter (Signed)
noted 

## 2022-11-26 NOTE — Telephone Encounter (Signed)
Patient's caregiver left message with answering service requesting to cancel today's appointment. Due to multiple last minute cancellations, I unfortunately had to let caregiver Gunnar Fusi) know that we would no longer be able to offer rides to Mr. Stailey due to excessive cancellations and missed appointments. Caregiver apologized and stated that she would let the patient and the patient's family know. I have also called the patient's brother and left a VM with this information as well as my direct extension to discuss next steps and alternatives for getting patient the care he requires.

## 2022-11-26 NOTE — Telephone Encounter (Signed)
I received a fax from answering service that this man will not be able to make his appt today@ 10 am 11/26/2022. Team was informed.

## 2022-12-04 ENCOUNTER — Encounter: Payer: Self-pay | Admitting: Emergency Medicine

## 2022-12-04 ENCOUNTER — Emergency Department: Payer: Medicare Other

## 2022-12-04 ENCOUNTER — Emergency Department
Admission: EM | Admit: 2022-12-04 | Discharge: 2022-12-04 | Disposition: A | Discharge status: Home / Self Care | Payer: Medicare Other | Source: Home / Self Care | Attending: Student in an Organized Health Care Education/Training Program | Admitting: Student in an Organized Health Care Education/Training Program

## 2022-12-04 ENCOUNTER — Other Ambulatory Visit: Payer: Self-pay

## 2022-12-04 DIAGNOSIS — M79671 Pain in right foot: Secondary | ICD-10-CM

## 2022-12-04 DIAGNOSIS — W228XXA Striking against or struck by other objects, initial encounter: Secondary | ICD-10-CM | POA: Insufficient documentation

## 2022-12-04 DIAGNOSIS — S92414A Nondisplaced fracture of proximal phalanx of right great toe, initial encounter for closed fracture: Secondary | ICD-10-CM | POA: Insufficient documentation

## 2022-12-04 DIAGNOSIS — L03115 Cellulitis of right lower limb: Secondary | ICD-10-CM | POA: Diagnosis not present

## 2022-12-04 LAB — COMPREHENSIVE METABOLIC PANEL
ALT: 30 U/L (ref 0–44)
AST: 28 U/L (ref 15–41)
Albumin: 3.5 g/dL (ref 3.5–5.0)
Alkaline Phosphatase: 37 U/L — ABNORMAL LOW (ref 38–126)
Anion gap: 8 (ref 5–15)
BUN: 15 mg/dL (ref 6–20)
CO2: 28 mmol/L (ref 22–32)
Calcium: 9 mg/dL (ref 8.9–10.3)
Chloride: 101 mmol/L (ref 98–111)
Creatinine, Ser: 0.71 mg/dL (ref 0.61–1.24)
GFR, Estimated: >60 mL/min (ref >60)
Glucose, Bld: 141 mg/dL — ABNORMAL HIGH (ref 70–99)
Potassium: 3.8 mmol/L (ref 3.5–5.1)
Sodium: 137 mmol/L (ref 135–145)
Total Bilirubin: 0.9 mg/dL (ref 0.3–1.2)
Total Protein: 7.1 g/dL (ref 6.5–8.1)

## 2022-12-04 LAB — CBC WITH DIFFERENTIAL/PLATELET
Abs Immature Granulocytes: 0.05 10*3/uL (ref 0.00–0.07)
Basophils Absolute: 0 10*3/uL (ref 0.0–0.1)
Basophils Relative: 0 %
Eosinophils Absolute: 0 10*3/uL (ref 0.0–0.5)
Eosinophils Relative: 1 %
HCT: 42.2 % (ref 39.0–52.0)
Hemoglobin: 14.3 g/dL (ref 13.0–17.0)
Immature Granulocytes: 1 %
Lymphocytes Relative: 12 %
Lymphs Abs: 0.6 10*3/uL — ABNORMAL LOW (ref 0.7–4.0)
MCH: 32.7 pg (ref 26.0–34.0)
MCHC: 33.9 g/dL (ref 30.0–36.0)
MCV: 96.6 fL (ref 80.0–100.0)
Monocytes Absolute: 0.6 10*3/uL (ref 0.1–1.0)
Monocytes Relative: 14 %
Neutro Abs: 3.3 10*3/uL (ref 1.7–7.7)
Neutrophils Relative %: 72 %
Platelets: 84 10*3/uL — ABNORMAL LOW (ref 150–400)
RBC: 4.37 MIL/uL (ref 4.22–5.81)
RDW: 12.4 % (ref 11.5–15.5)
WBC: 4.6 10*3/uL (ref 4.0–10.5)
nRBC: 0 % (ref 0.0–0.2)

## 2022-12-04 LAB — BRAIN NATRIURETIC PEPTIDE: B Natriuretic Peptide: 40.3 pg/mL (ref 0.0–100.0)

## 2022-12-04 MED ORDER — CEPHALEXIN 500 MG PO CAPS
500.0000 mg | ORAL_CAPSULE | Freq: Once | ORAL | Status: AC
Start: 2022-12-04 — End: 2022-12-04
  Administered 2022-12-04: 500 mg via ORAL
  Filled 2022-12-04: qty 1

## 2022-12-04 MED ORDER — CEPHALEXIN 500 MG PO CAPS: 500.0000 mg | ORAL_CAPSULE | Freq: Three times a day (TID) | ORAL | 0 refills | Status: DC

## 2022-12-04 NOTE — ED Triage Notes (Signed)
First Nurse Note;  Pt via EMS from home. Pt had a fall yesterday after he stubbed his toe. EMS reports R knee and leg pain. Pt has hx of cognitive impairment. Pt is A&Ox4 and NAD, states he is at his baseline.   103 HR  96% on RA 131/57 BP

## 2022-12-04 NOTE — ED Provider Triage Note (Signed)
  Emergency Medicine Provider Triage Evaluation Note  Ethan Alvarez , a 61 y.o.male,  was evaluated in triage.  Pt complains of right lower extremity pain.  Patient states that his right leg has been swollen for "some time".  Additionally reports injuring his right foot yesterday and since then has had significant pain.  Reports a blister along his great toe as well.  Denies any other symptoms.   Review of Systems  Positive: Right leg pain, swelling Negative: Denies fever, chest pain, vomiting  Physical Exam  There were no vitals filed for this visit. Gen:   Awake, no distress   Resp:  Normal effort  MSK:   Moves extremities without difficulty  Other:  Large blister noted along the dorsum of the right great toe.  2+ pitting edema in the right lower extremity.  Mild erythema.  Medical Decision Making  Given the patient's initial medical screening exam, the following diagnostic evaluation has been ordered. The patient will be placed in the appropriate treatment space, once one is available, to complete the evaluation and treatment. I have discussed the plan of care with the patient and I have advised the patient that an ED physician or mid-level practitioner will reevaluate their condition after the test results have been received, as the results may give them additional insight into the type of treatment they may need.    Diagnostics: DVT ultrasound, foot x-ray, chest x-ray, labs, EKG.  Treatments: none immediately   Varney Daily, [place] 12/04/22 1027

## 2022-12-04 NOTE — ED Triage Notes (Signed)
See first nurse note. Pt states that he was trying to open a sliding door and hurt his right leg and right toe. Pt now saying he tripped over chairs. First nurse note states hx of cognitive impairment.

## 2022-12-04 NOTE — ED Provider Notes (Signed)
[facility] Provider Note    Event Date/Time   First MD Initiated Contact with Patient 12/04/22 1116     (approximate)   History   Leg Pain (right)   HPI  Ethan Alvarez is a 61 y.o. male presents to facility due to right foot pain occurred after he stubbed his toe yesterday.  Having swelling some blistering over the past 24 hours.  No fevers or chills.  Patient thinks that he has a blood clot.  Does have baseline cognitive impairment which is reportedly at his baseline.  No reported head injury.  No other complaints.     Physical Exam   Triage Vital Signs: ED Triage Vitals  Enc Vitals Group     BP 12/04/22 1037 (!) 131/53     Pulse Rate 12/04/22 1037 95     Resp 12/04/22 1037 20     Temp 12/04/22 1037 97.9 F (36.6 C)     Temp Source 12/04/22 1037 Oral     SpO2 12/04/22 1037 92 %     Weight 12/04/22 1039 230 lb (104.3 kg)     Height 12/04/22 1132 5\' 10"  (1.778 m)     Head Circumference --      Peak Flow --      Pain Score 12/04/22 1038 0     Pain Loc --      Pain Edu? --      Excl. in GC? --     Most recent vital signs: Vitals:   12/04/22 1037  BP: (!) 131/53  Pulse: 95  Resp: 20  Temp: 97.9 F (36.6 C)  SpO2: 92%     Constitutional: Alert  Eyes: Conjunctivae are normal.  Head: Atraumatic. Nose: No congestion/rhinnorhea. Mouth/Throat: Mucous membranes are moist.   Neck: Painless ROM.  Cardiovascular:   Good peripheral circulation. Respiratory: Normal respiratory effort.  No retractions.  Gastrointestinal: Soft and nontender.  Musculoskeletal: Swelling and erythema over the right foot with a quarter sized clear blister over the MTP,  Neurologic:  MAE spontaneously. No gross focal neurologic deficits are appreciated.  Skin:  Skin is warm, dry and intact. No rash noted. Psychiatric: Mood and affect are normal. Speech and behavior are normal.    ED Results / Procedures / Treatments   Labs (all labs ordered are listed, but  only abnormal results are displayed) Labs Reviewed  COMPREHENSIVE METABOLIC PANEL - Abnormal; Notable for the following components:      Result Value   Glucose, Bld 141 (*)    Alkaline Phosphatase 37 (*)    All other components within normal limits  CBC WITH DIFFERENTIAL/PLATELET - Abnormal; Notable for the following components:   Platelets 84 (*)    Lymphs Abs 0.6 (*)    All other components within normal limits  BRAIN NATRIURETIC PEPTIDE     EKG  ED ECG REPORT I, 12/06/22, the attending physician, personally viewed and interpreted this ECG.   Date: 12/04/2022  EKG Time: 10:46  Rate: 95  Rhythm: sinus  Axis: normal  Intervals:normal  ST&T Change: no stemi    RADIOLOGY Please see ED Course for my review and interpretation.  I personally reviewed all radiographic images ordered to evaluate for the above acute complaints and reviewed radiology reports and findings.  These findings were personally discussed with the patient.  Please see medical record for radiology report.    PROCEDURES:  Critical Care performed:   Procedures   MEDICATIONS ORDERED IN ED: Medications  cephALEXin (  KEFLEX) capsule 500 mg (has no administration in time range)     IMPRESSION / MDM / ASSESSMENT AND PLAN / ED COURSE  I reviewed the triage vital signs and the nursing notes.                              Differential diagnosis includes, but is not limited to, fracture, contusion, cellulitis, abscess, DVT  Patient presenting to the ER for evaluation of symptoms as described above.  Based on symptoms, risk factors and considered above differential, this presenting complaint could reflect a potentially life-threatening illness therefore the patient will be placed on continuous pulse oximetry and telemetry for monitoring.  Laboratory evaluation will be sent to evaluate for the above complaints.  X-rays ordered for the blood differential.  Will order ultrasound to evaluate for  DVT.   Clinical Course as of 12/04/22 1226  Tue Dec 04, 2022  1148 Chest x-ray on my review and interpretation without evidence of effusion or infiltrate. [PR]  1222 X-rays do show evidence of fracture of proximal first metatarsal.  Given surrounding erythema and blister will be placed on antibiotics.  No open wound to suggest open fracture.  No gas.  Ultrasound without evidence of DVT.  Blood work otherwise reassuring.  Patient stable and appropriate for outpatient follow-up. [PR]    Clinical Course User Index [PR] Willy Eddy, MD   FINAL CLINICAL IMPRESSION(S) / ED DIAGNOSES   Final diagnoses:  Right foot pain  Closed nondisplaced fracture of proximal phalanx of right great toe, initial encounter     Rx / DC Orders   ED Discharge Orders          Ordered    cephALEXin (KEFLEX) 500 MG capsule  3 times daily        12/04/22 1222             Note:  This document was prepared using Dragon voice recognition software and may include unintentional dictation errors.    Willy Eddy, MD 12/04/22 1226

## 2022-12-06 ENCOUNTER — Encounter: Payer: Self-pay | Admitting: Emergency Medicine

## 2022-12-06 ENCOUNTER — Other Ambulatory Visit: Payer: Self-pay

## 2022-12-06 ENCOUNTER — Emergency Department: Payer: Medicare Other

## 2022-12-06 ENCOUNTER — Inpatient Hospital Stay
Admission: EM | Admit: 2022-12-06 | Discharge: 2022-12-18 | DRG: 602 | Disposition: A | Discharge status: Skilled Nursing Facility | Payer: Medicare Other | Attending: Internal Medicine | Admitting: Internal Medicine

## 2022-12-06 DIAGNOSIS — G928 Other toxic encephalopathy: Secondary | ICD-10-CM

## 2022-12-06 DIAGNOSIS — Z6832 Body mass index (BMI) 32.0-32.9, adult: Secondary | ICD-10-CM | POA: Diagnosis not present

## 2022-12-06 DIAGNOSIS — D696 Thrombocytopenia, unspecified: Secondary | ICD-10-CM | POA: Diagnosis present

## 2022-12-06 DIAGNOSIS — L97519 Non-pressure chronic ulcer of other part of right foot with unspecified severity: Secondary | ICD-10-CM | POA: Diagnosis present

## 2022-12-06 DIAGNOSIS — E669 Obesity, unspecified: Secondary | ICD-10-CM | POA: Diagnosis present

## 2022-12-06 DIAGNOSIS — S90421A Blister (nonthermal), right great toe, initial encounter: Secondary | ICD-10-CM | POA: Diagnosis present

## 2022-12-06 DIAGNOSIS — A419 Sepsis, unspecified organism: Secondary | ICD-10-CM | POA: Diagnosis not present

## 2022-12-06 DIAGNOSIS — L03031 Cellulitis of right toe: Secondary | ICD-10-CM | POA: Diagnosis present

## 2022-12-06 DIAGNOSIS — L03119 Cellulitis of unspecified part of limb: Secondary | ICD-10-CM | POA: Diagnosis not present

## 2022-12-06 DIAGNOSIS — E860 Dehydration: Secondary | ICD-10-CM | POA: Diagnosis present

## 2022-12-06 DIAGNOSIS — L03115 Cellulitis of right lower limb: Principal | ICD-10-CM | POA: Diagnosis present

## 2022-12-06 DIAGNOSIS — G319 Degenerative disease of nervous system, unspecified: Secondary | ICD-10-CM | POA: Diagnosis present

## 2022-12-06 DIAGNOSIS — Z791 Long term (current) use of non-steroidal anti-inflammatories (NSAID): Secondary | ICD-10-CM

## 2022-12-06 DIAGNOSIS — F203 Undifferentiated schizophrenia: Secondary | ICD-10-CM | POA: Diagnosis present

## 2022-12-06 DIAGNOSIS — S99921D Unspecified injury of right foot, subsequent encounter: Secondary | ICD-10-CM

## 2022-12-06 DIAGNOSIS — E66811 Obesity, class 1: Secondary | ICD-10-CM | POA: Diagnosis present

## 2022-12-06 DIAGNOSIS — J690 Pneumonitis due to inhalation of food and vomit: Secondary | ICD-10-CM | POA: Diagnosis not present

## 2022-12-06 DIAGNOSIS — S92414A Nondisplaced fracture of proximal phalanx of right great toe, initial encounter for closed fracture: Secondary | ICD-10-CM

## 2022-12-06 DIAGNOSIS — R4183 Borderline intellectual functioning: Secondary | ICD-10-CM

## 2022-12-06 DIAGNOSIS — F411 Generalized anxiety disorder: Secondary | ICD-10-CM

## 2022-12-06 DIAGNOSIS — F209 Schizophrenia, unspecified: Secondary | ICD-10-CM | POA: Diagnosis present

## 2022-12-06 DIAGNOSIS — F05 Delirium due to known physiological condition: Secondary | ICD-10-CM | POA: Diagnosis not present

## 2022-12-06 DIAGNOSIS — I10 Essential (primary) hypertension: Secondary | ICD-10-CM | POA: Diagnosis present

## 2022-12-06 DIAGNOSIS — W19XXXA Unspecified fall, initial encounter: Secondary | ICD-10-CM | POA: Diagnosis present

## 2022-12-06 DIAGNOSIS — L02619 Cutaneous abscess of unspecified foot: Secondary | ICD-10-CM | POA: Diagnosis not present

## 2022-12-06 DIAGNOSIS — Z1152 Encounter for screening for COVID-19: Secondary | ICD-10-CM | POA: Diagnosis not present

## 2022-12-06 DIAGNOSIS — Z888 Allergy status to other drugs, medicaments and biological substances status: Secondary | ICD-10-CM

## 2022-12-06 DIAGNOSIS — L02611 Cutaneous abscess of right foot: Secondary | ICD-10-CM | POA: Diagnosis present

## 2022-12-06 DIAGNOSIS — K5900 Constipation, unspecified: Secondary | ICD-10-CM | POA: Diagnosis not present

## 2022-12-06 DIAGNOSIS — J9601 Acute respiratory failure with hypoxia: Secondary | ICD-10-CM | POA: Diagnosis not present

## 2022-12-06 DIAGNOSIS — Z79899 Other long term (current) drug therapy: Secondary | ICD-10-CM

## 2022-12-06 DIAGNOSIS — R296 Repeated falls: Secondary | ICD-10-CM | POA: Diagnosis present

## 2022-12-06 LAB — URINALYSIS, ROUTINE W REFLEX MICROSCOPIC
Bilirubin Urine: NEGATIVE
Glucose, UA: NEGATIVE mg/dL
Hgb urine dipstick: NEGATIVE
Ketones, ur: 5 mg/dL — AB
Leukocytes,Ua: NEGATIVE
Nitrite: NEGATIVE
Protein, ur: 30 mg/dL — AB
Specific Gravity, Urine: 1.031 — ABNORMAL HIGH (ref 1.005–1.030)
Squamous Epithelial / HPF: NONE SEEN (ref 0–5)
pH: 5 (ref 5.0–8.0)

## 2022-12-06 LAB — CBC
HCT: 43.3 % (ref 39.0–52.0)
Hemoglobin: 14.3 g/dL (ref 13.0–17.0)
MCH: 32.6 pg (ref 26.0–34.0)
MCHC: 33 g/dL (ref 30.0–36.0)
MCV: 98.6 fL (ref 80.0–100.0)
Platelets: 90 10*3/uL — ABNORMAL LOW (ref 150–400)
RBC: 4.39 MIL/uL (ref 4.22–5.81)
RDW: 12.7 % (ref 11.5–15.5)
WBC: 3.4 10*3/uL — ABNORMAL LOW (ref 4.0–10.5)
nRBC: 0 % (ref 0.0–0.2)

## 2022-12-06 LAB — BASIC METABOLIC PANEL
Anion gap: 10 (ref 5–15)
BUN: 20 mg/dL (ref 6–20)
CO2: 30 mmol/L (ref 22–32)
Calcium: 9.2 mg/dL (ref 8.9–10.3)
Chloride: 101 mmol/L (ref 98–111)
Creatinine, Ser: 0.83 mg/dL (ref 0.61–1.24)
GFR, Estimated: >60 mL/min (ref >60)
Glucose, Bld: 111 mg/dL — ABNORMAL HIGH (ref 70–99)
Potassium: 4.2 mmol/L (ref 3.5–5.1)
Sodium: 141 mmol/L (ref 135–145)

## 2022-12-06 LAB — RESP PANEL BY RT-PCR (RSV, FLU A&B, COVID)  RVPGX2
Influenza A by PCR: NEGATIVE
Influenza B by PCR: NEGATIVE
Resp Syncytial Virus by PCR: NEGATIVE
SARS Coronavirus 2 by RT PCR: NEGATIVE

## 2022-12-06 LAB — HIV ANTIBODY (ROUTINE TESTING W REFLEX): HIV Screen 4th Generation wRfx: NONREACTIVE

## 2022-12-06 MED ORDER — ACETAMINOPHEN 325 MG PO TABS
650.0000 mg | ORAL_TABLET | Freq: Two times a day (BID) | ORAL | Status: DC
  Administered 2022-12-06 – 2022-12-18 (×21): 650 mg via ORAL
  Filled 2022-12-06 (×20): qty 2

## 2022-12-06 MED ORDER — PALIPERIDONE ER 3 MG PO TB24
9.0000 mg | ORAL_TABLET | Freq: Every day | ORAL | Status: DC
  Administered 2022-12-06 – 2022-12-17 (×11): 9 mg via ORAL
  Filled 2022-12-06 (×14): qty 3

## 2022-12-06 MED ORDER — OLANZAPINE 5 MG PO TABS
5.0000 mg | ORAL_TABLET | Freq: Two times a day (BID) | ORAL | Status: DC
  Administered 2022-12-06 – 2022-12-15 (×16): 5 mg via ORAL
  Filled 2022-12-06 (×19): qty 1

## 2022-12-06 MED ORDER — CEPHALEXIN 500 MG PO CAPS: 500.0000 mg | ORAL_CAPSULE | Freq: Three times a day (TID) | ORAL | Status: DC

## 2022-12-06 MED ORDER — PROPRANOLOL HCL 10 MG PO TABS
20.0000 mg | ORAL_TABLET | Freq: Two times a day (BID) | ORAL | Status: DC
  Administered 2022-12-06 – 2022-12-18 (×21): 20 mg via ORAL
  Filled 2022-12-06 (×2): qty 1
  Filled 2022-12-06: qty 2
  Filled 2022-12-06: qty 1
  Filled 2022-12-06 (×3): qty 2
  Filled 2022-12-06: qty 1
  Filled 2022-12-06: qty 2
  Filled 2022-12-06 (×3): qty 1
  Filled 2022-12-06 (×7): qty 2
  Filled 2022-12-06: qty 1
  Filled 2022-12-06 (×2): qty 2

## 2022-12-06 MED ORDER — LORAZEPAM 1 MG PO TABS
1.0000 mg | ORAL_TABLET | Freq: Three times a day (TID) | ORAL | Status: DC
  Administered 2022-12-06 – 2022-12-09 (×10): 1 mg via ORAL
  Filled 2022-12-06 (×10): qty 1

## 2022-12-06 MED ORDER — VITAMIN D 25 MCG (1000 UNIT) PO TABS
2000.0000 [IU] | ORAL_TABLET | Freq: Every day | ORAL | Status: DC
  Administered 2022-12-07 – 2022-12-18 (×10): 2000 [IU] via ORAL
  Filled 2022-12-06 (×10): qty 2

## 2022-12-06 MED ORDER — CEFAZOLIN SODIUM-DEXTROSE 2-4 GM/100ML-% IV SOLN
2.0000 g | Freq: Three times a day (TID) | INTRAVENOUS | Status: DC
  Administered 2022-12-06 – 2022-12-08 (×6): 2 g via INTRAVENOUS
  Filled 2022-12-06 (×6): qty 100

## 2022-12-06 MED ORDER — PALIPERIDONE ER 3 MG PO TB24
6.0000 mg | ORAL_TABLET | Freq: Every day | ORAL | Status: DC
  Administered 2022-12-07 – 2022-12-15 (×8): 6 mg via ORAL
  Filled 2022-12-06 (×9): qty 2

## 2022-12-06 MED ORDER — DIVALPROEX SODIUM 500 MG PO DR TAB
1000.0000 mg | DELAYED_RELEASE_TABLET | Freq: Two times a day (BID) | ORAL | Status: DC
  Administered 2022-12-06 – 2022-12-18 (×22): 1000 mg via ORAL
  Filled 2022-12-06 (×25): qty 2

## 2022-12-06 MED ORDER — BENZTROPINE MESYLATE 0.5 MG PO TABS
0.5000 mg | ORAL_TABLET | ORAL | Status: DC
  Administered 2022-12-07 – 2022-12-18 (×11): 0.5 mg via ORAL
  Filled 2022-12-06 (×11): qty 1

## 2022-12-06 MED ORDER — CEPHALEXIN 500 MG PO CAPS
500.0000 mg | ORAL_CAPSULE | Freq: Once | ORAL | Status: AC
  Administered 2022-12-06: 500 mg via ORAL
  Filled 2022-12-06: qty 1

## 2022-12-06 MED ORDER — ONDANSETRON HCL 4 MG PO TABS: 4.0000 mg | ORAL_TABLET | Freq: Four times a day (QID) | ORAL | Status: DC | PRN

## 2022-12-06 MED ORDER — SERTRALINE HCL 50 MG PO TABS
100.0000 mg | ORAL_TABLET | Freq: Two times a day (BID) | ORAL | Status: DC
  Administered 2022-12-06 – 2022-12-18 (×22): 100 mg via ORAL
  Filled 2022-12-06 (×22): qty 2

## 2022-12-06 MED ORDER — TRAMADOL HCL 50 MG PO TABS
50.0000 mg | ORAL_TABLET | Freq: Three times a day (TID) | ORAL | Status: DC | PRN
  Filled 2022-12-06: qty 1

## 2022-12-06 MED ORDER — MELOXICAM 7.5 MG PO TABS: 15.0000 mg | ORAL_TABLET | Freq: Every day | ORAL | Status: DC

## 2022-12-06 MED ORDER — ONDANSETRON HCL 4 MG/2ML IJ SOLN: 4.0000 mg | Freq: Four times a day (QID) | INTRAMUSCULAR | Status: DC | PRN

## 2022-12-06 MED ORDER — THIAMINE HCL 100 MG PO TABS
100.0000 mg | ORAL_TABLET | Freq: Every day | ORAL | Status: DC
  Administered 2022-12-06 – 2022-12-18 (×11): 100 mg via ORAL
  Filled 2022-12-06 (×22): qty 1

## 2022-12-06 MED ORDER — VITAMIN B-12 100 MCG PO TABS
100.0000 ug | ORAL_TABLET | Freq: Every day | ORAL | Status: DC
  Administered 2022-12-07 – 2022-12-18 (×10): 100 ug via ORAL
  Filled 2022-12-06 (×11): qty 1

## 2022-12-06 MED ORDER — BENZTROPINE MESYLATE 0.5 MG PO TABS
1.0000 mg | ORAL_TABLET | Freq: Two times a day (BID) | ORAL | Status: DC
  Administered 2022-12-06 – 2022-12-17 (×19): 1 mg via ORAL
  Filled 2022-12-06: qty 1
  Filled 2022-12-06: qty 2
  Filled 2022-12-06: qty 1
  Filled 2022-12-06 (×8): qty 2
  Filled 2022-12-06: qty 1
  Filled 2022-12-06: qty 2
  Filled 2022-12-06 (×3): qty 1
  Filled 2022-12-06 (×3): qty 2
  Filled 2022-12-06 (×5): qty 1
  Filled 2022-12-06 (×2): qty 2

## 2022-12-06 MED ORDER — VITAMIN C 500 MG PO TABS
250.0000 mg | ORAL_TABLET | Freq: Every day | ORAL | Status: DC
  Administered 2022-12-07 – 2022-12-18 (×10): 250 mg via ORAL
  Filled 2022-12-06 (×10): qty 1

## 2022-12-06 NOTE — ED Provider Notes (Signed)
[facility] Provider Note    Event Date/Time   First MD Initiated Contact with Patient 12/06/22 0940     (approximate)   History   Fall   HPI  Ethan Alvarez is a 61 y.o. male   Past medical history of Schizophrenia, hypertension presents emergency department with worsening cognition and ambulation with a toe injury, frequent falls.  He was seen in the emergency department 2 days ago with a stubbed toe and fracture of the right great toe and some overlying skin tear and cellulitic changes and was discharged with antibiotic.   He has had difficulty ambulating on his walker and weightbearing on the injured right foot and has had a couple of extra falls.  No significant injuries and denies head strike.  He states he is here because of difficulty getting around the house with his toe injury.  I spoke with Ethan Alvarez who was his sister for additional information.  She states that he has caretakers at home but has had a subacute decline in his cognition over the past several months and also in his ambulating since his injury.  Frequent falls.  They are working on getting him into a long-term facility to care for after him.  The patient denies any other acute medical complaints, and in review of system he denies chest pain, abdominal pain nausea vomiting diarrhea urinary symptoms or pain anywhere else other than his foot.  History was obtained via the patient.  I called his Sister Ethan Alvarez on the telephone who acts as an independent historian who offers collateral information as above.  I reviewed external medical notes including an emergency department visit for toe injury 12/04/2022.      Physical Exam   Triage Vital Signs: ED Triage Vitals  Enc Vitals Group     BP 12/06/22 0919 118/72     Pulse Rate 12/06/22 0919 100     Resp 12/06/22 0919 17     Temp 12/06/22 0919 98.5 F (36.9 C)     Temp Source 12/06/22 0919 Oral     SpO2 12/06/22 0919 93 %     Weight  12/06/22 0924 229 lb 15 oz (104.3 kg)     Height 12/06/22 0924 5\' 10"  (1.778 m)     Head Circumference --      Peak Flow --      Pain Score 12/06/22 0924 3     Pain Loc --      Pain Edu? --      Excl. in GC? --     Most recent vital signs: Vitals:   12/06/22 0919  BP: 118/72  Pulse: 100  Resp: 17  Temp: 98.5 F (36.9 C)  SpO2: 93%    General: Awake, no distress.  CV:  Good peripheral perfusion.  Resp:  Normal effort.  Abd:  No distention.  Other:  He has a small skin tear above the right great toe and some bruising throughout, there is no crepitus or pain out of proportion and is neurovascular intact to affected foot.  He is able to range all of his other limbs with full active range of motion and has no obvious signs of trauma or tenderness to palpation elsewhere on the body.  He is alert oriented and pleasant.  Hemodynamics reviewed and appropriate reassuring.   ED Results / Procedures / Treatments   Labs (all labs ordered are listed, but only abnormal results are displayed) Labs Reviewed  BASIC METABOLIC PANEL - Abnormal;  Notable for the following components:      Result Value   Glucose, Bld 111 (*)    All other components within normal limits  CBC - Abnormal; Notable for the following components:   WBC 3.4 (*)    Platelets 90 (*)    All other components within normal limits  URINALYSIS, ROUTINE W REFLEX MICROSCOPIC - Abnormal; Notable for the following components:   Color, Urine AMBER (*)    APPearance HAZY (*)    Specific Gravity, Urine 1.031 (*)    Ketones, ur 5 (*)    Protein, ur 30 (*)    Bacteria, UA RARE (*)    All other components within normal limits  RESP PANEL BY RT-PCR (RSV, FLU A&B, COVID)  [id number]     I reviewed labs and they are notable for white blood cell count which is low at 3.4 which is similar to 4.6 obtained several days ago, H&H has been stable at 14.3.  Creatinine at baseline 0.8.  EKG  ED ECG REPORT I, Pilar Jarvis, the attending  physician, personally viewed and interpreted this ECG.   Date: 12/06/2022  EKG Time: 0927  Rate: 96  Rhythm: normal sinus rhythm  Axis: nl  Intervals:none  ST&T Change: No acute ischemic changes    RADIOLOGY I independently reviewed and interpreted CT scan of the head and see no obvious bleeding or midline shift   PROCEDURES:  Critical Care performed: No  Procedures   MEDICATIONS ORDERED IN ED: Medications  cephALEXin (KEFLEX) capsule 500 mg (500 mg Oral Given 12/06/22 1152)    Consultants:  I spoke with hospitalist regarding admission and regarding care plan for this patient.   IMPRESSION / MDM / ASSESSMENT AND PLAN / ED COURSE  I reviewed the triage vital signs and the nursing notes.                              Differential diagnosis includes, but is not limited to, metabolic or infectious etiologies, intracranial bleeding, toe fracture already known, cellulitis, considered but less likely necrotizing fasciitis, subacute decline in cognition may be related to his chronic psychiatric illness    MDM: This is a patient with a toe injury and difficulty ambulating with frequent falls at home in addition to subacute decline in cognition who his family is trying to get him into a facility to look after him.  Obtain a medical workup including urinalysis and CT scan of the head to assess for reversible causes.    Fortunately the CT scan of the head is negative for intracranial pathologies, urinalysis with rare bacteria and no leukocytes without symptoms I doubt UTI.  Will admit given toe fracture frequent falls and cellulitis.   Patient's presentation is most consistent with acute presentation with potential threat to life or bodily function.       FINAL CLINICAL IMPRESSION(S) / ED DIAGNOSES   Final diagnoses:  Frequent falls  Injury of right toe, subsequent encounter  Cellulitis of right lower extremity     Rx / DC Orders   ED Discharge Orders     None         Note:  This document was prepared using Dragon voice recognition software and may include unintentional dictation errors.    Pilar Jarvis, MD 12/06/22 1242

## 2022-12-06 NOTE — Assessment & Plan Note (Signed)
Chronic with no evidence of bleeding Monitor closely during this hospitalization SCDs for DVT prophylaxis 

## 2022-12-06 NOTE — Assessment & Plan Note (Addendum)
Patient presents to the ER for evaluation of frequent falls despite the use of an assistive device with one of the falls resulting in an acute fracture involving  the medial base of the first proximal phalanx with fracture line extending into the first MTP joint. Patient has been unable to ambulate with a cam boot that was recommended and family thinks he has contributed to several falls -PT/OT evaluation Place patient on fall precautions

## 2022-12-06 NOTE — ED Notes (Signed)
Sister Dmani Mizer POA [phone] call for all medical information & history

## 2022-12-06 NOTE — ED Notes (Signed)
Pt stating he injured his toe about a month ago, saw a Dr at "[facility]". Pt states the injury has gotten progressively worse since. Also states the bilateral swelling started around the same time and has also gotten progressively worse since then.

## 2022-12-06 NOTE — H&P (Addendum)
History and Physical    Patient: Ethan Alvarez LZJ:[id number] DOB: [birth year] DOA: 12/06/2022 DOS: the patient was seen and examined on 12/06/2022 PCP: Luciana Axe, NP  Patient coming from: Home  Chief Complaint:  Chief Complaint  Patient presents with   Fall    Most of the history was obtained from patient's brother and legal guardian Ethan Alvarez over the phone HPI: BAYLEN Alvarez is a 61 y.o. male with medical history significant for schizophrenia, hypertension, obesity who presents to the emergency room via EMS for evaluation of several falls over the last couple of days and inability to ambulate. Patient was seen in the ER, 2 days prior to this admission after he fell and stubbed his toe and was noted to have some redness and swelling over his right foot.  He had a right foot x-ray which showed a closed nondisplaced fracture of the proximal phalanx of the right great toe and was discharged home on a cam boot and oral antibiotics for cellulitis. Patient's brother states that since his discharge home he has had difficulty getting around and has had several falls.  He fell the night prior to his admission and the brother had to call EMS to help get him up.  He had another fall on the morning of his admission and so EMS was called.  Patient has had repeated falls despite the use of an assist device. He is seen and examined in the ER and denies having any fever, no chills, no abdominal pain, no changes in his bowel habits, no headache, no dizziness, no lightheadedness, urinary symptoms, no blurred vision no focal deficit. Significant labs include a platelet count of 90,000 His right foot is swollen, red with differential warmth and he has a skin tear over the right great toe He will be admitted to the hospital for further evaluation    Review of Systems: As mentioned in the history of present illness. All other systems reviewed and are negative. Past Medical History:  Diagnosis Date    Hypertension    Schizophrenia [facility])    Past Surgical History:  Procedure Laterality Date   INCISION AND DRAINAGE Right 06/02/2019   Procedure: INCISION AND DRAINAGE;  Surgeon: Christena Flake, MD;  Location: [facility] ORS;  Service: Orthopedics;  Laterality: Right;   QUADRICEPS TENDON REPAIR Right 04/25/2019   Procedure: REPAIR QUADRICEP TENDON;  Surgeon: Christena Flake, MD;  Location: [facility] ORS;  Service: Orthopedics;  Laterality: Right;   QUADRICEPS TENDON REPAIR Right 06/02/2019   Procedure: REPAIR QUADRICEP TENDON;  Surgeon: Christena Flake, MD;  Location: [facility] ORS;  Service: Orthopedics;  Laterality: Right;   Social History:  reports that he has never smoked. He has never used smokeless tobacco. He reports that he does not currently use alcohol. He reports that he does not currently use drugs.  Allergies  Allergen Reactions   Haloperidol Lactate Other (See Comments)    Per [facility]; nature of intolerance unclear intolerance Other reaction(s): Other (See Comments) Per [facility]; nature of intolerance unclear     History reviewed. No pertinent family history.  Prior to Admission medications   Medication Sig Start Date End Date Taking? Authorizing Provider  acetaminophen (TYLENOL) 325 MG tablet Take 650 mg by mouth 2 (two) times daily.    [provider]  ascorbic acid (VITAMIN C) 250 MG tablet Take by mouth.    [provider]  ascorbic acid (VITAMIN C) 500 MG tablet Take 250 mg by mouth daily.  [provider]  benztropine (COGENTIN) 0.5 MG tablet Take 0.5 mg by mouth every morning.     [provider]  benztropine (COGENTIN) 1 MG tablet Take 1 mg by mouth 2 (two) times daily.  03/25/19   [provider]  cephALEXin (KEFLEX) 500 MG capsule Take 1 capsule (500 mg total) by mouth 3 (three) times daily for 7 days. 12/04/22 12/11/22  Willy Eddy, MD  cholecalciferol (VITAMIN D3) 25 MCG (1000 UT) tablet Take 2,000 Units by mouth daily.    [provider]  Cholecalciferol 25 MCG (1000 UT) tablet Take by mouth.    [provider]  divalproex (DEPAKOTE) 500 MG DR tablet Take 1,000 mg by mouth 2 (two) times a day. 02/13/17 06/28/21  [provider]  divalproex (DEPAKOTE) 500 MG DR tablet TAKE 2 TABLETS BY MOUTH ONCE EVERY MORNING AT 8AM AND 2 TABS EVERY AFTERNOON AT [facility] 06/12/21   [provider]  LORazepam (ATIVAN) 1 MG tablet Take 2 tablets (2 mg total) by mouth in the morning, at noon, and at bedtime. Takes at 0800, 1400, 2000 12/31/20   Enedina Finner, MD  LORazepam (ATIVAN) 1 MG tablet Take by mouth. 06/12/21   [provider]  meloxicam (MOBIC) 15 MG tablet Take 1 tablet (15 mg total) by mouth daily. 11/17/21   Cuthriell, Delorise Royals, PA-C  OLANZapine (ZYPREXA) 5 MG tablet Take 5 mg by mouth See admin instructions. 5 mg 0800 and 1400    [provider]  paliperidone (INVEGA) 6 MG 24 hr tablet Take 6 mg by mouth daily.    [provider]  paliperidone (INVEGA) 9 MG 24 hr tablet Take 9 mg by mouth at bedtime.    [provider]  propranolol (INDERAL) 20 MG tablet Take 20 mg by mouth 2 (two) times daily. 08/12/18   [provider]  sertraline (ZOLOFT) 100 MG tablet Take 100 mg by mouth 2 (two) times a day. 01/30/18 06/28/21  [provider]  sertraline (ZOLOFT) 100 MG tablet Take 1 tablet by mouth twice daily - at 8am AND at 8pm 06/12/21   [provider]  thiamine 100 MG tablet Take by mouth.    [provider]  vitamin B-12 (CYANOCOBALAMIN) 100 MCG tablet Take 100 mcg by mouth daily.    [provider]    Physical Exam: Vitals:   12/06/22 0919 12/06/22 0924  BP: 118/72   Pulse: 100   Resp: 17   Temp: 98.5 F (36.9 C)   TempSrc: Oral   SpO2: 93%   Weight:  104.3 kg  Height:  5\' 10"  (1.778 m)   Physical Exam Vitals and nursing note reviewed.  Constitutional:      Appearance: He is obese.  HENT:     Head: Normocephalic.      Nose: Nose normal.     Mouth/Throat:     Mouth: Mucous membranes are moist.  Eyes:     Conjunctiva/sclera: Conjunctivae normal.  Cardiovascular:     Rate and Rhythm: Normal rate and regular rhythm.  Pulmonary:     Effort: Pulmonary effort is normal.     Breath sounds: Normal breath sounds.  Abdominal:     General: Bowel sounds are normal.     Palpations: Abdomen is soft.     Comments: Central adiposity  Musculoskeletal:        General: Swelling and signs of injury present.     Cervical back: Normal range of motion and neck supple.  Comments: Swelling, redness, differential warmth involving the right foot  Skin:    General: Skin is warm and dry.  Neurological:     Mental Status: He is alert.     Motor: Weakness present.  Psychiatric:        Mood and Affect: Mood normal.        Behavior: Behavior normal.     Data Reviewed: Relevant notes from primary care and specialist visits, past discharge summaries as available in EHR, including Care Everywhere. Prior diagnostic testing as pertinent to current admission diagnoses Updated medications and problem lists for reconciliation ED course, including vitals, labs, imaging, treatment and response to treatment Triage notes, nursing and pharmacy notes and ED provider's notes Notable results as noted in HPI Labs reviewed.  Sodium 141, potassium 4.2, chloride 101, bicarb 30, glucose 111, BUN 20, creatinine 0.83, calcium 9.2, white count 3.4, hemoglobin 14.3, hematocrit 43.3, platelet count 90,000 CT scan of the head without contrast shows no acute finding. Premature brain atrophy. Generalized sinusitis that is progressed from 2020. There are no new results to review at this time.  Assessment and Plan: * Cellulitis and abscess of foot Will place patient empirically on Ancef adjusted to patient's weight for right foot cellulitis Wound care consult for management of skin tear over his right great toe  Frequent falls Patient presents  to the ER for evaluation of frequent falls despite the use of an assistive device with one of the falls resulting in an acute fracture involving  the medial base of the first proximal phalanx with fracture line extending into the first MTP joint. 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  All questions and concerns have been addressed.  He verbalizes understanding and agrees with the plan.  CODE STATUS was discussed and patient is a full code. Severity of Illness: The appropriate patient status for this patient is INPATIENT. Inpatient status is judged to be reasonable and necessary in order to provide the required intensity of service to ensure the patient's safety. The patient's presenting symptoms, physical exam findings, and initial radiographic and laboratory data in the context of their chronic comorbidities is felt to place them at high risk for further clinical deterioration. Furthermore, it is not anticipated that the patient will be medically stable for discharge from the hospital within 2 midnights of  admission.   * I certify that at the point of admission it is my clinical judgment that the patient will require inpatient hospital care spanning beyond 2 midnights from the point of admission due to high intensity of service, high risk for further deterioration and high frequency of surveillance required.*  Author: Lucile Shutters, MD 12/06/2022 3:59 PM  For on  call review [email or web address].

## 2022-12-06 NOTE — Assessment & Plan Note (Signed)
Patient is on several antipsychotic medications which will be continued during this hospitalization

## 2022-12-06 NOTE — ED Triage Notes (Signed)
EMS REPORT: pt ems from home for fall today - pt seen here yesterday for a fall also. Per ems right foot is swollen and toe has an abr. Pt denies pain.

## 2022-12-06 NOTE — Assessment & Plan Note (Addendum)
Estimated body mass index is 32.99 kg/m as calculated from the following:   Height as of this encounter: 5\' 10"  (1.778 m).   Weight as of this encounter: 104.3 kg.   Complicates overall prognosis and care Lifestyle modification and exercise has been discussed with patient

## 2022-12-06 NOTE — ED Triage Notes (Signed)
Patient reports having multiple falls over the past couple days. Patient unsure how he fell- states he was dizzy. Bilateral feet swelling with worse right foot. Open skin tear to right foot.

## 2022-12-06 NOTE — Assessment & Plan Note (Addendum)
Will place patient empirically on Ancef adjusted to patient's weight for right foot cellulitis Wound care was consulted and they placed the recommendations. Podiatry was also consulted-pending consult -Continue with Ancef

## 2022-12-06 NOTE — Assessment & Plan Note (Signed)
Will require increased nursing assistance with some ADLs

## 2022-12-06 NOTE — Consult Note (Signed)
Pharmacy Antibiotic Note  Ethan Alvarez is a 61 y.o. male admitted on 12/06/2022 with cellulitis.  Pharmacy has been consulted for cefazolin dosing.  Patient stubbed toe two days prior to admission, X-ray showed closed nondisplaced fracture of proximal phalanx of right great toe.  Right foot is now reported to be swollen and red with skin tear over right great toe.  Plan: Cefazolin 2 grams IV every 8 hours  Height: 5\' 10"  (177.8 cm) Weight: 104.3 kg (229 lb 15 oz) IBW/kg (Calculated) : 73  Temp (24hrs), Avg:98.5 F (36.9 C), Min:98.5 F (36.9 C), Max:98.5 F (36.9 C)  Recent Labs  Lab 12/04/22 1043 12/06/22 0927  WBC 4.6 3.4*  CREATININE 0.71 0.83    Estimated Creatinine Clearance: 114.5 mL/min (by C-G formula based on SCr of 0.83 mg/dL).    Allergies  Allergen Reactions   Haloperidol Lactate Other (See Comments)    Per [facility]; nature of intolerance unclear intolerance Other reaction(s): Other (See Comments) Per [facility]; nature of intolerance unclear     Antimicrobials this admission: Cefazolin 12/21 >> Cephalexin 12/21 x 1 in ED  Dose adjustments this admission: N/A  Microbiology results: N/A  Thank you for allowing pharmacy to be a part of this patient's care.  1/22, PharmD 12/06/2022 1:18 PM

## 2022-12-06 NOTE — ED Notes (Signed)
See triage note  Presents from home via EMS  s/p fall  Per sister he has had freq falls and has had some tremors of the past 3-4 days  Sister states he may need additional care or placement  Unsure if her hit his head or if he has an infection

## 2022-12-06 NOTE — Consult Note (Signed)
WOC Nurse Consult Note: Reason for Consult:Skin tear to right great toe, patient had had several recent falls, has neuropathy, infection, and fracture Wound type:trauma Pressure Injury POA: Yes/No/NA Measurement:To be obtained by Bedside RN and documented on Nursing Flow Sheet with next dressing change. Dressing procedure/placement/frequency:I have provided Nursing with conservative care guidance using a NS cleanse, pat dry followed by application of a gauze dressing dampened with betadine and topped with a dry dressing and secured with conform bandaging/paper tape.  I have communicated with Admitting Provider, Dr Joylene Igo, via Secure Chat and indicated  that I recommend consultation with Podiatric medicine for further input to the POC following evaluation.  WOC nursing team will not follow, but will remain available to this patient, the nursing and medical teams.  Please re-consult if needed.  Thank you for inviting [place] to participate in this patient's Plan of Care.  Ladona Mow, MSN, RN, CNS, GNP, Leda Min, [employer], [employer] phone:  [phone]

## 2022-12-06 NOTE — ED Notes (Signed)
Pt resting with eyes closed, even rise and fall of the chest. 

## 2022-12-07 DIAGNOSIS — S92414A Nondisplaced fracture of proximal phalanx of right great toe, initial encounter for closed fracture: Secondary | ICD-10-CM

## 2022-12-07 DIAGNOSIS — L03119 Cellulitis of unspecified part of limb: Secondary | ICD-10-CM | POA: Diagnosis not present

## 2022-12-07 DIAGNOSIS — F209 Schizophrenia, unspecified: Secondary | ICD-10-CM | POA: Diagnosis not present

## 2022-12-07 DIAGNOSIS — R296 Repeated falls: Secondary | ICD-10-CM

## 2022-12-07 DIAGNOSIS — R4183 Borderline intellectual functioning: Secondary | ICD-10-CM

## 2022-12-07 DIAGNOSIS — D696 Thrombocytopenia, unspecified: Secondary | ICD-10-CM | POA: Diagnosis not present

## 2022-12-07 DIAGNOSIS — E669 Obesity, unspecified: Secondary | ICD-10-CM

## 2022-12-07 LAB — BASIC METABOLIC PANEL
Anion gap: 9 (ref 5–15)
BUN: 22 mg/dL — ABNORMAL HIGH (ref 6–20)
CO2: 29 mmol/L (ref 22–32)
Calcium: 8.6 mg/dL — ABNORMAL LOW (ref 8.9–10.3)
Chloride: 103 mmol/L (ref 98–111)
Creatinine, Ser: 0.91 mg/dL (ref 0.61–1.24)
GFR, Estimated: >60 mL/min (ref >60)
Glucose, Bld: 105 mg/dL — ABNORMAL HIGH (ref 70–99)
Potassium: 3.6 mmol/L (ref 3.5–5.1)
Sodium: 141 mmol/L (ref 135–145)

## 2022-12-07 LAB — CBC
HCT: 38.2 % — ABNORMAL LOW (ref 39.0–52.0)
Hemoglobin: 12.6 g/dL — ABNORMAL LOW (ref 13.0–17.0)
MCH: 32.3 pg (ref 26.0–34.0)
MCHC: 33 g/dL (ref 30.0–36.0)
MCV: 97.9 fL (ref 80.0–100.0)
Platelets: 84 10*3/uL — ABNORMAL LOW (ref 150–400)
RBC: 3.9 MIL/uL — ABNORMAL LOW (ref 4.22–5.81)
RDW: 12.7 % (ref 11.5–15.5)
WBC: 3.3 10*3/uL — ABNORMAL LOW (ref 4.0–10.5)
nRBC: 0 % (ref 0.0–0.2)

## 2022-12-07 MED ORDER — LEVOTHYROXINE SODIUM 50 MCG PO TABS
25.0000 ug | ORAL_TABLET | Freq: Every day | ORAL | Status: DC
  Administered 2022-12-08 – 2022-12-18 (×8): 25 ug via ORAL
  Filled 2022-12-07 (×10): qty 1

## 2022-12-07 NOTE — Consult Note (Signed)
Pharmacy Antibiotic Note  Ethan Alvarez is a 61 y.o. male admitted on 12/06/2022 with cellulitis.  Pharmacy has been consulted for cefazolin dosing.  Patient stubbed toe two days prior to admission, X-ray showed closed nondisplaced fracture of proximal phalanx of right great toe.  Right foot is now reported to be swollen and red with skin tear over right great toe.  Plan: Cefazolin 2 grams IV every 8 hours  Height: 5\' 10"  (177.8 cm) Weight: 104.3 kg (229 lb 15 oz) IBW/kg (Calculated) : 73  Temp (24hrs), Avg:98.2 F (36.8 C), Min:97.6 F (36.4 C), Max:98.8 F (37.1 C)  Recent Labs  Lab 12/04/22 1043 12/06/22 0927 12/07/22 0628  WBC 4.6 3.4* 3.3*  CREATININE 0.71 0.83 0.91     Estimated Creatinine Clearance: 104.4 mL/min (by C-G formula based on SCr of 0.91 mg/dL).    Allergies  Allergen Reactions   Haloperidol Lactate Other (See Comments)    Per [facility]; nature of intolerance unclear intolerance Other reaction(s): Other (See Comments) Per [facility]; nature of intolerance unclear     Antimicrobials this admission: Cefazolin 12/21 >> Cephalexin 12/21 x 1 in ED  Dose adjustments this admission: N/A  Microbiology results: N/A  Thank you for allowing pharmacy to be a part of this patient's care.  1/22, PharmD 12/07/2022 12:31 PM

## 2022-12-07 NOTE — Progress Notes (Signed)
PT Cancellation Note  Patient Details Name: EUCLID CASSETTA MRN: [id number] DOB: [birth year]   Cancelled Treatment:    Reason Eval/Treat Not Completed: Patient not medically ready. PT orders received and pt chart reviewed. Per chart review, pt pending podiatry consult for R foot wound/phalanx fx. Will hold PT evaluation until podiatry consult for appropriate WB precautions.   Vira Blanco, PT, DPT 8:29 AM,12/07/22 Physical Therapist - [facility]

## 2022-12-07 NOTE — Progress Notes (Signed)
Progress Note   Patient: Ethan Alvarez PQA:[id number] DOB: [birth year] DOA: 12/06/2022     1 DOS: the patient was seen and examined on 12/07/2022   Brief hospital course: Taken from H&P.  Ethan Alvarez is a 61 y.o. male with medical history significant for schizophrenia, hypertension, obesity who presents to the emergency room via EMS for evaluation of several falls over the last couple of days and inability to ambulate. Patient was seen in the ER, 2 days prior to this admission after he fell and stubbed his toe and was noted to have some redness and swelling over his right foot.  He had a right foot x-ray which showed a closed nondisplaced fracture of the proximal phalanx of the right great toe and was discharged home on a cam boot and oral antibiotics for cellulitis. Patient's brother states that since his discharge home he has had difficulty getting around and has had several falls.  He fell the night prior to his admission and the brother had to call EMS to help get him up.  He had another fall on the morning of his admission and so EMS was called.  Patient has had repeated falls despite the use of an assist device.  Most of the history was obtained from patient's brother and legal guardian [name] over the phone   ED course.  Stable vitals, afebrile.  Labs only significant for platelets of 90,000, seems chronic. COVID-19, influenza and RSV negative.  Urine with mild ketonuria CT head with no acute abnormality.  Premature brain atrophy.  Generalized sinusitis which is progressed from 2020. Venous Doppler studies done on 12/19 during most recent ED visit was negative for DVT.  12/22: Hemodynamically stable.  Labs with WBC of 3.3, hemoglobin 12.6 and platelets 84.  BMP stable.  Wound care placed their recommendations. Podiatry was consulted-pending consult    Assessment and Plan: * Cellulitis and abscess of foot Will place patient empirically on Ancef adjusted to patient's weight for  right foot cellulitis Wound care was consulted and they placed the recommendations. Podiatry was also consulted-pending consult -Continue with Ancef  Frequent falls Patient presents to the ER for evaluation of frequent falls despite the use of an assistive device with one of the falls resulting in an acute fracture involving  the medial base of the first proximal phalanx with fracture line extending into the first MTP joint. 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   Weight as of this encounter: 104.3 kg.   Complicates overall prognosis and care Lifestyle modification and exercise has been discussed with patient   Subjective: Patient was seen and examined today.  He was complaining of right lower extremity pain especially the foot.  Physical Exam: Vitals:   12/07/22 0700 12/07/22 0800 12/07/22 1145 12/07/22 1259  BP: (!) 101/54  116/60 125/66  Pulse: 95 (!) 101 94 89  Resp: 18 20 18 18   Temp: 98.5 F (36.9 C) 98.6  F (37 C) 98.8 F (37.1 C) 98.8 F (37.1 C)  TempSrc: Oral Oral Oral Oral  SpO2: 94% 92% 94% 92%  Weight:      Height:       General.  Obese gentleman, in no acute distress. Pulmonary.  Lungs clear bilaterally, normal respiratory effort. CV.  Regular rate and rhythm, no JVD, rub or murmur. Abdomen.  Soft, nontender, nondistended, BS  positive. CNS.  Alert and oriented .  No focal neurologic deficit. Extremities.  Right foot with a wound at the dorsal surface of base of first toe with surrounding edema and erythema. Psychiatry.  Judgment and insight appears normal.     Data Reviewed: Prior data reviewed  Family Communication: Discussed with patient  Disposition: Status is: Inpatient Remains inpatient appropriate because: Severity of illness  Planned Discharge Destination: Home with Home Health  Time spent: 50 minutes  This record has been created using Conservation officer, historic buildings. Errors have been sought and corrected,but may not always be located. Such creation errors do not reflect on the standard of care.   Author: Arnetha Courser, MD 12/07/2022 1:58 PM  For on call review [email or web address].

## 2022-12-07 NOTE — Consult Note (Signed)
Reason for Consult: Right hallux fracture Referring Physician: Dr. Margart Sickles Ethan Alvarez is an 61 y.o. male.  HPI: He suffered an injury to the right great toe on 12/03/2022.  He was seen in the ER and placed in a cam walker boot.  Is a difficulty ambulating and falls from this.  He was readmitted after a fall and concern for blistering and cellulitis of the right foot  Past Medical History:  Diagnosis Date   Hypertension    Schizophrenia [facility])     Past Surgical History:  Procedure Laterality Date   INCISION AND DRAINAGE Right 06/02/2019   Procedure: INCISION AND DRAINAGE;  Surgeon: Christena Flake, MD;  Location: [facility] ORS;  Service: Orthopedics;  Laterality: Right;   QUADRICEPS TENDON REPAIR Right 04/25/2019   Procedure: REPAIR QUADRICEP TENDON;  Surgeon: Christena Flake, MD;  Location: [facility] ORS;  Service: Orthopedics;  Laterality: Right;   QUADRICEPS TENDON REPAIR Right 06/02/2019   Procedure: REPAIR QUADRICEP TENDON;  Surgeon: Christena Flake, MD;  Location: [facility] ORS;  Service: Orthopedics;  Laterality: Right;    History reviewed. No pertinent family history.  Social History:  reports that he has never smoked. He has never used smokeless tobacco. He reports that he does not currently use alcohol. He reports that he does not currently use drugs.  Allergies:  Allergies  Allergen Reactions   Haloperidol Lactate Other (See Comments)    Per [facility]; nature of intolerance unclear intolerance Other reaction(s): Other (See Comments) Per [facility]; nature of intolerance unclear     Medications: I have reviewed the patient's current medications.  Results for orders placed or performed during the hospital encounter of 12/06/22 (from the past 48 hour(s))  Basic metabolic panel     Status: Abnormal   Collection Time: 12/06/22  9:27 AM  Result Value Ref Range   Sodium 141 135 - 145 mmol/L   Potassium 4.2 3.5 - 5.1 mmol/L   Chloride 101 98 - 111 mmol/L   CO2 30 22 - 32 mmol/L   Glucose, Bld 111 (H)  70 - 99 mg/dL    Comment: Glucose reference range applies only to samples taken after fasting for at least 8 hours.   BUN 20 6 - 20 mg/dL   Creatinine, Ser 4.13 0.61 - 1.24 mg/dL   Calcium 9.2 8.9 - 24.4 mg/dL   GFR, Estimated >01 >02 mL/min    Comment: (NOTE) Calculated using the CKD-EPI Creatinine Equation (2021)    Anion gap 10 5 - 15    Comment: Performed at [facility], [address] [place], [place] [address]  CBC     Status: Abnormal   Collection Time: 12/06/22  9:27 AM  Result Value Ref Range   WBC 3.4 (L) 4.0 - 10.5 K/uL   RBC 4.39 4.22 - 5.81 MIL/uL   Hemoglobin 14.3 13.0 - 17.0 g/dL   HCT 64.4 03.4 - 74.2 %   MCV 98.6 80.0 - 100.0 fL   MCH 32.6 26.0 - 34.0 pg   MCHC 33.0 30.0 - 36.0 g/dL   RDW 59.5 63.8 - 75.6 %   Platelets 90 (L) 150 - 400 K/uL    Comment: Immature Platelet Fraction may be clinically indicated, consider ordering this additional test [id number]    nRBC 0.0 0.0 - 0.2 %    Comment: Performed at [facility], [address] [place], [place] [address]  Resp panel by RT-PCR (RSV, Flu A&B, Covid) Anterior Nasal Swab  Status: None   Collection Time: 12/06/22 11:40 AM   Specimen: Anterior Nasal Swab  Result Value Ref Range   SARS Coronavirus 2 by RT PCR NEGATIVE NEGATIVE    Comment: (NOTE) SARS-CoV-2 target nucleic acids are NOT DETECTED.  The SARS-CoV-2 RNA is generally detectable in upper respiratory specimens during the acute phase of infection. The lowest concentration of SARS-CoV-2 viral copies this assay can detect is 138 copies/mL. A negative result does not preclude SARS-Cov-2 infection and should not be used as the sole basis for treatment or other patient management decisions. A negative result may occur with  improper specimen collection/handling, submission of specimen other than nasopharyngeal swab, presence of viral mutation(s) within the areas targeted by this assay, and inadequate number of viral copies(<138  copies/mL). A negative result must be combined with clinical observations, patient history, and epidemiological information. The expected result is Negative.  Fact Sheet for Patients:  BloggerCourse.com  Fact Sheet for Healthcare Providers:  SeriousBroker.it  This test is no t yet approved or cleared by the [place] FDA and  has been authorized for detection and/or diagnosis of SARS-CoV-2 by FDA under an Emergency Use Authorization (EUA). This EUA will remain  in effect (meaning this test can be used) for the duration of the COVID-19 declaration under Section 564(b)(1) of the Act, 21 U.S.C.section 360bbb-3(b)(1), unless the authorization is terminated  or revoked sooner.       Influenza A by PCR NEGATIVE NEGATIVE   Influenza B by PCR NEGATIVE NEGATIVE    Comment: (NOTE) The Xpert Xpress SARS-CoV-2/FLU/RSV plus assay is intended as an aid in the diagnosis of influenza from Nasopharyngeal swab specimens and should not be used as a sole basis for treatment. Nasal washings and aspirates are unacceptable for Xpert Xpress SARS-CoV-2/FLU/RSV testing.  Fact Sheet for Patients: BloggerCourse.com  Fact Sheet for Healthcare Providers: SeriousBroker.it  This test is not yet approved or cleared by the [place] FDA and has been authorized for detection and/or diagnosis of SARS-CoV-2 by FDA under an Emergency Use Authorization (EUA). This EUA will remain in effect (meaning this test can be used) for the duration of the COVID-19 declaration under Section 564(b)(1) of the Act, 21 U.S.C. section 360bbb-3(b)(1), unless the authorization is terminated or revoked.     Resp Syncytial Virus by PCR NEGATIVE NEGATIVE    Comment: (NOTE) Fact Sheet for Patients: BloggerCourse.com  Fact Sheet for Healthcare  Providers: SeriousBroker.it  This test is not yet approved or cleared by the [place] FDA and has been authorized for detection and/or diagnosis of SARS-CoV-2 by FDA under an Emergency Use Authorization (EUA). This EUA will remain in effect (meaning this test can be used) for the duration of the COVID-19 declaration under Section 564(b)(1) of the Act, 21 U.S.C. section 360bbb-3(b)(1), unless the authorization is terminated or revoked.  Performed at [facility], [address] [place], [place] [address]   Urinalysis, Routine w reflex microscopic     Status: Abnormal   Collection Time: 12/06/22 12:18 PM  Result Value Ref Range   Color, Urine AMBER (A) YELLOW    Comment: BIOCHEMICALS MAY BE AFFECTED BY COLOR   APPearance HAZY (A) CLEAR   Specific Gravity, Urine 1.031 (H) 1.005 - 1.030   pH 5.0 5.0 - 8.0   Glucose, UA NEGATIVE NEGATIVE mg/dL   Hgb urine dipstick NEGATIVE NEGATIVE   Bilirubin Urine NEGATIVE NEGATIVE   Ketones, ur 5 (A) NEGATIVE mg/dL   Protein, ur 30 (A) NEGATIVE mg/dL  Nitrite NEGATIVE NEGATIVE   Leukocytes,Ua NEGATIVE NEGATIVE   RBC / HPF 0-5 0 - 5 RBC/hpf   WBC, UA 0-5 0 - 5 WBC/hpf   Bacteria, UA RARE (A) NONE SEEN   Squamous Epithelial / LPF NONE SEEN 0 - 5   Mucus PRESENT     Comment: Performed at [facility], [address] [place], [place] [address]  HIV Antibody (routine testing w rflx)     Status: None   Collection Time: 12/06/22  1:55 PM  Result Value Ref Range   HIV Screen 4th Generation wRfx Non Reactive Non Reactive    Comment: Performed at [facility], [address], [place], [place] [address]  Basic metabolic panel     Status: Abnormal   Collection Time: 12/07/22  6:28 AM  Result Value Ref Range   Sodium 141 135 - 145 mmol/L   Potassium 3.6 3.5 - 5.1 mmol/L   Chloride 103 98 - 111 mmol/L   CO2 29 22 - 32 mmol/L   Glucose, Bld 105 (H) 70 - 99 mg/dL    Comment: Glucose reference  range applies only to samples taken after fasting for at least 8 hours.   BUN 22 (H) 6 - 20 mg/dL   Creatinine, Ser 5.78 0.61 - 1.24 mg/dL   Calcium 8.6 (L) 8.9 - 10.3 mg/dL   GFR, Estimated >46 >96 mL/min    Comment: (NOTE) Calculated using the CKD-EPI Creatinine Equation (2021)    Anion gap 9 5 - 15    Comment: Performed at [facility], [address] [place], [place] [address]  CBC     Status: Abnormal   Collection Time: 12/07/22  6:28 AM  Result Value Ref Range   WBC 3.3 (L) 4.0 - 10.5 K/uL   RBC 3.90 (L) 4.22 - 5.81 MIL/uL   Hemoglobin 12.6 (L) 13.0 - 17.0 g/dL   HCT 41.3 (L) 24.4 - 01.0 %   MCV 97.9 80.0 - 100.0 fL   MCH 32.3 26.0 - 34.0 pg   MCHC 33.0 30.0 - 36.0 g/dL   RDW 27.2 53.6 - 64.4 %   Platelets 84 (L) 150 - 400 K/uL    Comment: Immature Platelet Fraction may be clinically indicated, consider ordering this additional test [id number]    nRBC 0.0 0.0 - 0.2 %    Comment: Performed at [facility], [address] [place], [place] [address]    CT Head Wo Contrast  Result Date: 12/06/2022 CLINICAL DATA:  Fall at home. Mental status change with unknown cause EXAM: CT HEAD WITHOUT CONTRAST TECHNIQUE: Contiguous axial images were obtained from the base of the skull through the vertex without intravenous contrast. RADIATION DOSE REDUCTION: This exam was performed according to the departmental dose-optimization program which includes automated exposure control, adjustment of the mA and/or kV according to patient size and/or use of iterative reconstruction technique. COMPARISON:  09/10/2019 FINDINGS: Brain: No evidence of acute infarction, hemorrhage, hydrocephalus, extra-axial collection or mass lesion/mass effect. Generalized brain atrophy, especially for age. Vascular: No hyperdense vessel or unexpected calcification. Skull: Normal. Negative for fracture or focal lesion. Sinuses/Orbits: Patchy bilateral sinus opacification that is progressed. Obstructing  cerumen at the right ear. IMPRESSION: 1. No acute finding. 2. Premature brain atrophy. 3. Generalized sinusitis that is progressed from 2020. Electronically Signed   By: Tiburcio Pea M.D.   On: 12/06/2022 11:23    Review of Systems  Musculoskeletal:  Positive for joint pain (Right great toe pain).  All other systems reviewed and are negative.  Blood pressure 125/66, pulse 89, temperature 98.8 F (37.1 C), temperature source Oral, resp. rate 18, height 5\' 10"  (1.778 m), weight 104.3 kg, SpO2 92 %.  Vitals:   12/07/22 1145 12/07/22 1259  BP: 116/60 125/66  Pulse: 94 89  Resp: 18 18  Temp: 98.8 F (37.1 C) 98.8 F (37.1 C)  SpO2: 94% 92%    General AA&O x3. Normal mood and affect.  Vascular Dorsalis pedis and posterior tibial pulses  present 2+ bilaterally  Capillary refill normal to all digits. Pedal hair growth normal.  Neurologic Epicritic sensation grossly present.  Dermatologic (Wound) Fracture blister dorsal right hallux noted, erythema to midfoot  Orthopedic: Motor intact BLE.    Assessment/Plan:  Closed fracture of right great toe proximal phalanx and mild cellulitis -Imaging: Studies independently reviewed.  Do not see indication for CT or MRI at this point -Antibiotics: Agree with Ancef while in house.  Can discharge on 7 days doxycycline -WB Status: WBAT in postop shoe.  Order placed.  Should be able to ambulate better using this as opposed to CAM boot -Wound Care: Fracture blister was lanced and drained.  Betadine applied.  Change daily with Betadine.  Do not see evidence of abscess or osteomyelitis clinically or radiographically -Ice and elevate right lower extremity -He may follow-up with me in 1 to 2 weeks for further care in the office as an outpatient -I discussed this with his sister as well, she expressed concerns about his functional decline and they feel like he is not going to be safe to come home. Recommend TOC consult. She asks this be discussed with her  as his primary POA before discussing with the patient as he does not have decision making capability and he gets upset about discussing leaving his apartment -Will sign off at this point.  Please call with questions  Edwin Capdam R Judson Tsan 12/07/2022, 3:44 PM   Best available via secure chat for questions or concerns.

## 2022-12-07 NOTE — ED Notes (Signed)
Pt resting with equal rise and fall of the chest.

## 2022-12-07 NOTE — Hospital Course (Addendum)
  Ethan Alvarez is a 61 y.o. male with medical history significant for schizophrenia, hypertension, obesity who presents to the emergency room via EMS for evaluation of several falls over the last couple of days and inability to ambulate. Patient was seen in the ER, 2 days prior to this admission after he fell and stubbed his toe and was noted to have some redness and swelling over his right foot.  He had a right foot x-ray which showed a closed nondisplaced fracture of the proximal phalanx of the right great toe and was discharged home on a cam boot and oral antibiotics for cellulitis. Patient's brother states that since his discharge home he has had difficulty getting around and has had several falls.   Patient is seen by podiatry, recommended 7 days of doxycycline.  However, patient has significant weakness, PT has recommended SNF placement. Patient spiked high fever at the night of 12/24.  Started Zosyn for aspiration pneumonia.  Changed to Unasyn 12/26. 12/30. Antibiotics completed after today's dose, pending nursing home placement.

## 2022-12-08 DIAGNOSIS — S92414A Nondisplaced fracture of proximal phalanx of right great toe, initial encounter for closed fracture: Secondary | ICD-10-CM | POA: Diagnosis not present

## 2022-12-08 DIAGNOSIS — L02619 Cutaneous abscess of unspecified foot: Secondary | ICD-10-CM | POA: Diagnosis not present

## 2022-12-08 DIAGNOSIS — F209 Schizophrenia, unspecified: Secondary | ICD-10-CM | POA: Diagnosis not present

## 2022-12-08 DIAGNOSIS — L03119 Cellulitis of unspecified part of limb: Secondary | ICD-10-CM | POA: Diagnosis not present

## 2022-12-08 LAB — CBC
HCT: 35.8 % — ABNORMAL LOW (ref 39.0–52.0)
Hemoglobin: 12.1 g/dL — ABNORMAL LOW (ref 13.0–17.0)
MCH: 32.7 pg (ref 26.0–34.0)
MCHC: 33.8 g/dL (ref 30.0–36.0)
MCV: 96.8 fL (ref 80.0–100.0)
Platelets: 95 10*3/uL — ABNORMAL LOW (ref 150–400)
RBC: 3.7 MIL/uL — ABNORMAL LOW (ref 4.22–5.81)
RDW: 12.6 % (ref 11.5–15.5)
WBC: 3.7 10*3/uL — ABNORMAL LOW (ref 4.0–10.5)
nRBC: 0 % (ref 0.0–0.2)

## 2022-12-08 MED ORDER — DOXYCYCLINE HYCLATE 100 MG PO TABS
100.0000 mg | ORAL_TABLET | Freq: Two times a day (BID) | ORAL | Status: DC
  Administered 2022-12-08 – 2022-12-09 (×4): 100 mg via ORAL
  Filled 2022-12-08 (×4): qty 1

## 2022-12-08 NOTE — TOC Initial Note (Addendum)
Transition of Care [facility]) - Initial/Assessment Note    Patient Details  Name: Ethan Alvarez MRN: [id number] Date of Birth: Apr 19, 1961  Transition of Care [facility]) CM/SW Contact:    Luvenia Redden, RN Phone Number: 12/08/2022, 2:10 PM  Clinical Narrative:                 Spoke with Guardian brother Meshilem Machuca [phone]) who is aware of the recommendations for SNF and agreed to starting bed search for two facilities [facility]). Guardian receptive to additional facility if no response from the twice facilities mentioned above. Pt lives alone however has aide assistance at all times. Pt has a RW to use when needed. No other DME mentioned.  TOC will start bed search and notify the guardian with any bed offers.  Addendum 3:00 pm-Starting PASRR pending Level II.  Expected Discharge Plan: Skilled Nursing Facility Barriers to Discharge: Continued Medical Work up   Patient Goals and CMS Choice   CMS Medicare.gov Compare Post Acute Care list provided to:: Patient Represenative (must comment) Choice offered to / list presented to : [facility] POA / Guardian      Expected Discharge Plan and Services   Discharge Planning Services: CM Consult   Living arrangements for the past 2 months: Single Family Home                 DME Arranged: Walker rolling                    Prior Living Arrangements/Services Living arrangements for the past 2 months: Single Family Home Lives with:: Self (Lives with aide services) Patient language and need for interpreter reviewed:: Yes Do you feel safe going back to the place where you live?: Yes      Need for Family Participation in Patient Care: Yes (Comment) Care giver support system in place?: Yes (comment) Current home services: Homehealth aide Criminal Activity/Legal Involvement Pertinent to Current Situation/Hospitalization: No - Comment as needed  Activities of Daily Living Home Assistive  Devices/Equipment: Walker (specify type) ADL Screening (condition at time of admission) Patient's cognitive ability adequate to safely complete daily activities?: Yes Is the patient deaf or have difficulty hearing?: No Does the patient have difficulty seeing, even when wearing glasses/contacts?: No Does the patient have difficulty concentrating, remembering, or making decisions?: No Patient able to express need for assistance with ADLs?: Yes Does the patient have difficulty dressing or bathing?: Yes Independently performs ADLs?: No Communication: Independent Dressing (OT): Needs assistance Is this a change from baseline?: Change from baseline, expected to last >3 days Grooming: Needs assistance Is this a change from baseline?: Change from baseline, expected to last <3 days Feeding: Independent Bathing: Needs assistance Is this a change from baseline?: Change from baseline, expected to last >3 days Toileting: Needs assistance Is this a change from baseline?: Change from baseline, expected to last >3days In/Out Bed: Needs assistance Is this a change from baseline?: Change from baseline, expected to last >3 days Walks in Home: Needs assistance Is this a change from baseline?: Change from baseline, expected to last >3 days Does the patient have difficulty walking or climbing stairs?: Yes Weakness of Legs: Both Weakness of Arms/Hands: None  Permission Sought/Granted   Permission granted to share information with :  (legal guardian brother Rodd Heft)              Emotional Assessment Appearance:: Appears stated age, Appears older than stated age Attitude/Demeanor/Rapport:  Engaged Affect (typically observed): Accepting Orientation: : Oriented to Self   Psych Involvement: No (comment)  Admission diagnosis:  Cellulitis of right lower extremity [L03.115] Frequent falls [R29.6] Injury of right toe, subsequent encounter [S99.921D] Patient Active Problem List   Diagnosis Date Noted    Closed nondisplaced fracture of proximal phalanx of right great toe 12/07/2022   Frequent falls 12/06/2022   Cellulitis and abscess of foot 12/06/2022   Thrombocytopenia (HCC) 04/24/2021   Anxiety 04/24/2021   OCD (obsessive compulsive disorder) 04/24/2021   Rosacea 04/24/2021   Dehydration    Altered mental status    Sepsis without acute organ dysfunction (HCC)    COVID-19 virus infection 12/30/2020   Generalized weakness    Schizophrenia (HCC)    Obesity (BMI 30.0-34.9) 04/23/2020   Aggressive behavior of adult 09/25/2019   Suicidal ideation 09/25/2019   Fall 09/14/2019   MSSA bacteremia 07/09/2019   Septic arthritis (HCC) 07/09/2019   Cellulitis of right knee 06/01/2019   Rupture of right quadriceps tendon 04/25/2019   Tremor 02/12/2019   Abnormal TSH 02/11/2018   Vitamin D deficiency 02/11/2018   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 [place] [address] Phone: [phone] Fax: [phone]  [id number] - [place], [place] - [address] [place] [address] Phone: [phone] Fax: [phone]     Social Determinants of Health (SDOH) Social History: SDOH Screenings   Food Insecurity: No Food Insecurity (12/07/2022)  Housing: Low Risk  (12/07/2022)  Transportation Needs: No Transportation Needs (12/07/2022)  Utilities: Not At Risk (12/07/2022)  Tobacco Use: Low Risk  (12/06/2022)   SDOH Interventions:     Readmission Risk Interventions     No data to display

## 2022-12-08 NOTE — Plan of Care (Signed)
  Problem: Clinical Measurements: Goal: Ability to maintain clinical measurements within normal limits will improve Outcome: Progressing   Problem: Activity: Goal: Risk for activity intolerance will decrease Outcome: Progressing   Problem: Nutrition: Goal: Adequate nutrition will be maintained Outcome: Progressing   Problem: Elimination: Goal: Will not experience complications related to bowel motility Outcome: Progressing Goal: Will not experience complications related to urinary retention Outcome: Progressing   Problem: Skin Integrity: Goal: Risk for impaired skin integrity will decrease Outcome: Progressing

## 2022-12-08 NOTE — Progress Notes (Signed)
  Progress Note   Patient: Ethan Alvarez MVE:[id number] DOB: [birth year] DOA: 12/06/2022     2 DOS: the patient was seen and examined on 12/08/2022   Brief hospital course:  Ethan Alvarez is a 61 y.o. male with medical history significant for schizophrenia, hypertension, obesity who presents to the emergency room via EMS for evaluation of several falls over the last couple of days and inability to ambulate. Patient was seen in the ER, 2 days prior to this admission after he fell and stubbed his toe and was noted to have some redness and swelling over his right foot.  He had a right foot x-ray which showed a closed nondisplaced fracture of the proximal phalanx of the right great toe and was discharged home on a cam boot and oral antibiotics for cellulitis. Patient's brother states that since his discharge home he has had difficulty getting around and has had several falls.   Patient is seen by podiatry, recommended 7 days of doxycycline.  However, patient has significant weakness, PT has recommended SNF placement.    Assessment and Plan: * Cellulitis and abscess of foot Acute fracture of right first proximal phalanx  Frequent falls Patient has no evidence of osteomyelitis.  Doxycycline for 7 days. Patient has been eval by physical therapy, recommended nursing home placement. TOC consult.   Thrombocytopenia (HCC) Stable, continue to follow  Schizophrenia (HCC) Borderline intellectual functioning Continue home medicines  Obesity (BMI 30.0-34.9) Estimated body mass index is 32.99 kg/m as calculated from the following:   Height as of this encounter: 5\' 10"  (1.778 m).   Weight as of this encounter: 104.3 kg.        Subjective:  Patient doing well, pain well-controlled.  But still has significant weakness.  Physical Exam: Vitals:   12/07/22 1259 12/07/22 2103 12/08/22 0045 12/08/22 0839  BP: 125/66 115/66 (!) 113/49 124/67  Pulse: 89 92 91 78  Resp: 18  18 18   Temp: 98.8 F  (37.1 C)  98.3 F (36.8 C) 98.1 F (36.7 C)  TempSrc: Oral     SpO2: 92%  90% 93%  Weight:      Height:       General exam: Appears calm and comfortable  Respiratory system: Clear to auscultation. Respiratory effort normal. Cardiovascular system: S1 & S2 heard, RRR. No JVD, murmurs, rubs, gallops or clicks. No pedal edema. Gastrointestinal system: Abdomen is nondistended, soft and nontender. No organomegaly or masses felt. Normal bowel sounds heard. Central nervous system: Alert and oriented x2. No focal neurological deficits. Extremities: Symmetric 5 x 5 power. Skin: No rashes, lesions or ulcers Psychiatry: Judgement and insight appear normal. Mood & affect appropriate.   Data Reviewed:  X-ray and lab results reviewed.  Family Communication:   Disposition: Status is: Inpatient Remains inpatient appropriate because: Severity of disease, unsafe discharge.  Planned Discharge Destination: Skilled nursing facility    Time spent: 35 minutes  Author: 12/10/22, MD 12/08/2022 12:09 PM  For on call review [email or web address].

## 2022-12-08 NOTE — Evaluation (Addendum)
Physical Therapy Evaluation Patient Details Name: Ethan Alvarez MRN: [id number] DOB: 1961/07/22 Today's Date: 12/08/2022  History of Present Illness  Ethan Alvarez is a 61 y.o. male with medical history significant for schizophrenia, hypertension, obesity who presents to the emergency room via EMS for evaluation of several falls over the last couple of days and inability to ambulate.  Patient was seen in the ER, 2 days prior to this admission after he fell and stubbed his toe and was noted to have some redness and swelling over his right foot.  He had a right foot x-Kaylise Blakeley which showed a closed nondisplaced fracture of the proximal phalanx of the right great toe and was discharged home on a cam boot and oral antibiotics for cellulitis.  Patient's brother states that since his discharge home he has had difficulty getting around and has had several falls.    Patient is seen by podiatry, recommended 7 days of doxycycline.  However, patient has significant weakness, PT has recommended SNF placement.  Clinical Impression  Pt is a pleasant 61 year old male who was admitted for cellulitis and 1st toe MTP fx. Per orders, post op shoe donned for mobility. Pt performs bed mobility/transfers with max assist +2 and RW with post op shoe donned. Pt responds well to brother at bedside who is primary guardian. Per brother, pt typically independent until recently when he began falling. Pt demonstrates deficits with strength/balance/mobility. Pt is very high falls risk. Is currently not at baseline level. Is +2 for all mobility. Encouraged pt to continue with B LE there-ex and to have RN staff dangle at bedside during med pass. Would benefit from skilled PT to address above deficits and promote optimal return to PLOF; recommend transition to STR upon discharge from acute hospitalization.      Recommendations for follow up therapy are one component of a multi-disciplinary discharge planning process, led by the attending  physician.  Recommendations may be updated based on patient status, additional functional criteria and insurance authorization.  Follow Up Recommendations Skilled nursing-short term rehab (<3 hours/day) Can patient physically be transported by private vehicle: No    Assistance Recommended at Discharge Frequent or constant Supervision/Assistance  Patient can return home with the following  Two people to help with walking and/or transfers;Two people to help with bathing/dressing/bathroom    Equipment Recommendations  (TBD)  Recommendations for Other Services       Functional Status Assessment Patient has had a recent decline in their functional status and demonstrates the ability to make significant improvements in function in a reasonable and predictable amount of time.     Precautions / Restrictions Precautions Precautions: Fall Restrictions Weight Bearing Restrictions: Yes RLE Weight Bearing: Weight bearing as tolerated Other Position/Activity Restrictions: in surgical shoe      Mobility  Bed Mobility Overal bed mobility: Needs Assistance Bed Mobility: Supine to Sit     Supine to sit: Max assist     General bed mobility comments: needs heavy assist. Once seated at EOB, heavy post leaning. Unable to sit upright without +2 assist. Brother in room for assistance. When returned back to bed, bed placed in trend to assist with positioning    Transfers Overall transfer level: Needs assistance   Transfers: Sit to/from Stand Sit to Stand: Max assist           General transfer comment: unable to push from seated surface due to heavy post lean. Post op shoe donned and brother assist with getting patient standing  with heavy assist. Once standing, used RW for side stepping at EOB. Unsteady and uncontrolled descent back to bed    Ambulation/Gait               General Gait Details: not safe to ambulate at this time  Stairs            Wheelchair Mobility     Modified Rankin (Stroke Patients Only)       Balance Overall balance assessment: Needs assistance, History of Falls Sitting-balance support: Feet supported Sitting balance-Leahy Scale: Zero     Standing balance support: Bilateral upper extremity supported Standing balance-Leahy Scale: Zero                               Pertinent Vitals/Pain Pain Assessment Pain Assessment: Faces Faces Pain Scale: Hurts little more Pain Location: B knees/foot Pain Descriptors / Indicators: Aching Pain Intervention(s): Limited activity within patient's tolerance, Repositioned    Home Living Family/patient expects to be discharged to:: Private residence Living Arrangements: Other relatives (brother) Available Help at Discharge: Family Type of Home: Apartment Home Access: Level entry       Home Layout: One level Home Equipment: Agricultural consultant (2 wheels) Additional Comments: lives in basement apartment in brother's home. Has level entry access from outside and 1 flight of steps to reach the main floor. Also has caregiver available a few hours each day    Prior Function Prior Level of Function : Needs assist             Mobility Comments: frequent falls, using RW makes his balance worse per brother. Usually indep and able to climb the stairs and ambulate household distances ADLs Comments: has been needing assist with ADLs     Hand Dominance        Extremity/Trunk Assessment   Upper Extremity Assessment Upper Extremity Assessment: Generalized weakness (B UE grossly 3+/5)    Lower Extremity Assessment Lower Extremity Assessment: Generalized weakness (B LE grossly 3/5)       Communication   Communication: No difficulties  Cognition Arousal/Alertness: Awake/alert Behavior During Therapy: WFL for tasks assessed/performed Overall Cognitive Status: Within Functional Limits for tasks assessed                                 General Comments: very  agreeable to session        General Comments      Exercises     Assessment/Plan    PT Assessment Patient needs continued PT services  PT Problem List Decreased strength;Decreased activity tolerance;Decreased balance;Decreased mobility;Obesity       PT Treatment Interventions Gait training;DME instruction;Therapeutic exercise;Balance training    PT Goals (Current goals can be found in the Care Plan section)  Acute Rehab PT Goals Patient Stated Goal: to get stronger PT Goal Formulation: With patient Time For Goal Achievement: 12/22/22 Potential to Achieve Goals: Fair    Frequency Min 2X/week     Co-evaluation               AM-PAC PT "6 Clicks" Mobility  Outcome Measure Help needed turning from your back to your side while in a flat bed without using bedrails?: A Lot Help needed moving from lying on your back to sitting on the side of a flat bed without using bedrails?: A Lot Help needed moving to and from a bed to a  chair (including a wheelchair)?: Total Help needed standing up from a chair using your arms (e.g., wheelchair or bedside chair)?: Total Help needed to walk in hospital room?: Total Help needed climbing 3-5 steps with a railing? : Total 6 Click Score: 8    End of Session   Activity Tolerance: Patient tolerated treatment well Patient left: in bed;with bed alarm set;with family/visitor present Nurse Communication: Mobility status PT Visit Diagnosis: Unsteadiness on feet (R26.81);Repeated falls (R29.6);Muscle weakness (generalized) (M62.81);History of falling (Z91.81)    Time: 3419-3790 PT Time Calculation (min) (ACUTE ONLY): 26 min   Charges:   PT Evaluation $PT Eval Low Complexity: 1 Low PT Treatments $Therapeutic Activity: 8-22 mins        Elizabeth Palau, PT, DPT, GCS [phone]   Lassie Demorest 12/08/2022, 1:06 PM

## 2022-12-08 NOTE — Progress Notes (Signed)
PT Cancellation Note  Patient Details Name: Ethan Alvarez MRN: [id number] DOB: 10/16/1961   Cancelled Treatment:    Reason Eval/Treat Not Completed: Other (comment). Pt currently eating breakfast and needs post op shoe. Will re-attempt once shoe in room.   Dutch Ing 12/08/2022, 8:57 AM Elizabeth Palau, PT, DPT, GCS [phone]

## 2022-12-09 ENCOUNTER — Inpatient Hospital Stay: Payer: Medicare Other

## 2022-12-09 DIAGNOSIS — L03119 Cellulitis of unspecified part of limb: Secondary | ICD-10-CM | POA: Diagnosis not present

## 2022-12-09 DIAGNOSIS — L02619 Cutaneous abscess of unspecified foot: Secondary | ICD-10-CM | POA: Diagnosis not present

## 2022-12-09 DIAGNOSIS — F209 Schizophrenia, unspecified: Secondary | ICD-10-CM | POA: Diagnosis not present

## 2022-12-09 NOTE — Evaluation (Signed)
Occupational Therapy Evaluation Patient Details Name: Ethan Alvarez MRN: [id number] DOB: August 06, 1961 Today's Date: 12/09/2022   History of Present Illness Ethan Alvarez is a 61 y.o. male with medical history significant for schizophrenia, hypertension, obesity who presents to the emergency room via EMS for evaluation of several falls over the last couple of days and inability to ambulate.  Patient was seen in the ER, 2 days prior to this admission after he fell and stubbed his toe and was noted to have some redness and swelling over his right foot.  He had a right foot x-ray which showed a closed nondisplaced fracture of the proximal phalanx of the right great toe and was discharged home on a cam boot.  Patient's brother states that since his discharge home he has had difficulty getting around and has had several falls.   Clinical Impression   Ethan Alvarez was seen for OT evaluation this date. Prior to hospital admission, pt was IND for mobility and assist for ADLs. Pt lives in basement apartment of brothers house. Pt presents to acute OT demonstrating impaired ADL performance and functional mobility 2/2 decreased activity tolerance and functional strength/ROM/balance deficits. Pt currently requires SETUP self-feeding bed level. MAX A sup<>sit, would benefit from +2 for all bed mobility. MAX A don post op shoe seated EOB, poor dynamic/static sitting balance with posterior lean. Pt would benefit from skilled OT to address noted impairments and functional limitations (see below for any additional details). Upon hospital discharge, recommend STR to maximize pt safety and return to PLOF.   Recommendations for follow up therapy are one component of a multi-disciplinary discharge planning process, led by the attending physician.  Recommendations may be updated based on patient status, additional functional criteria and insurance authorization.   Follow Up Recommendations  Skilled nursing-short term rehab (<3  hours/day)     Assistance Recommended at Discharge Frequent or constant Supervision/Assistance  Patient can return home with the following Two people to help with walking and/or transfers;Two people to help with bathing/dressing/bathroom    Functional Status Assessment  Patient has had a recent decline in their functional status and demonstrates the ability to make significant improvements in function in a reasonable and predictable amount of time.  Equipment Recommendations  Other (comment) (defer)    Recommendations for Other Services       Precautions / Restrictions Precautions Precautions: Fall Restrictions Weight Bearing Restrictions: Yes RLE Weight Bearing: Weight bearing as tolerated Other Position/Activity Restrictions: in surgical shoe      Mobility Bed Mobility Overal bed mobility: Needs Assistance Bed Mobility: Sit to Supine, Supine to Sit     Supine to sit: Max assist Sit to supine: Max assist   General bed mobility comments: assist for trunk and scooting hips    Transfers                   General transfer comment: unsafe to attempt w/o +2      Balance Overall balance assessment: Needs assistance, History of Falls Sitting-balance support: Feet supported Sitting balance-Leahy Scale: Poor   Postural control: Posterior lean                                 ADL either performed or assessed with clinical judgement   ADL Overall ADL's : Needs assistance/impaired  General ADL Comments: SETUP self-feeding bed level. MAX A don post op shoe seated EOB, +2 assist for dynamic sitting balance      Pertinent Vitals/Pain Pain Assessment Pain Assessment: No/denies pain     Hand Dominance Right   Extremity/Trunk Assessment Upper Extremity Assessment Upper Extremity Assessment: Generalized weakness   Lower Extremity Assessment Lower Extremity Assessment: Generalized weakness        Communication Communication Communication: No difficulties   Cognition Arousal/Alertness: Awake/alert Behavior During Therapy: WFL for tasks assessed/performed Overall Cognitive Status: History of cognitive impairments - at baseline                                        Home Living Family/patient expects to be discharged to:: Private residence Living Arrangements: Other relatives (brother) Available Help at Discharge: Family Type of Home: Apartment Home Access: Level entry     Home Layout: One level               Home Equipment: Agricultural consultant (2 wheels)   Additional Comments: lives in basement apartment in brother's home. Has level entry access from outside and 1 flight of steps to reach the main floor. Also has caregiver available a few hours each day      Prior Functioning/Environment Prior Level of Function : Needs assist             Mobility Comments: frequent falls, using RW makes his balance worse per brother. Usually indep and able to climb the stairs and ambulate household distances ADLs Comments: has been needing assist with ADLs        OT Problem List: Decreased strength;Decreased range of motion;Decreased activity tolerance;Impaired balance (sitting and/or standing);Decreased safety awareness      OT Treatment/Interventions: Self-care/ADL training;Therapeutic exercise;DME and/or AE instruction;Energy conservation;Therapeutic activities;Patient/family education;Balance training    OT Goals(Current goals can be found in the care plan section) Acute Rehab OT Goals Patient Stated Goal: to go home OT Goal Formulation: With patient Time For Goal Achievement: 12/23/22 Potential to Achieve Goals: Good ADL Goals Pt Will Perform Eating: sitting;with min guard assist Pt Will Transfer to Toilet: with max assist;squat pivot transfer;bedside commode Pt Will Perform Toileting - Clothing Manipulation and hygiene: with min  assist;sitting/lateral leans  OT Frequency: Min 2X/week    Co-evaluation              AM-PAC OT "6 Clicks" Daily Activity     Outcome Measure Help from another person eating meals?: A Little Help from another person taking care of personal grooming?: A Little Help from another person toileting, which includes using toliet, bedpan, or urinal?: A Lot Help from another person bathing (including washing, rinsing, drying)?: A Lot Help from another person to put on and taking off regular upper body clothing?: A Little Help from another person to put on and taking off regular lower body clothing?: A Lot 6 Click Score: 15   End of Session Nurse Communication: Mobility status  Activity Tolerance: Patient tolerated treatment well Patient left: in bed;with call bell/phone within reach;with bed alarm set  OT Visit Diagnosis: Unsteadiness on feet (R26.81);Muscle weakness (generalized) (M62.81)                Time: 1610-9604 OT Time Calculation (min): 16 min Charges:  OT General Charges $OT Visit: 1 Visit OT Evaluation $OT Eval Moderate Complexity: 1 Mod  Kathie Dike, M.S. OTR/L  12/09/22, 9:23  AM  ascom [phone]

## 2022-12-09 NOTE — Progress Notes (Signed)
Pt 02 sats is between 88% - 90% in RA. 2L [place] placed and he is currently in 92%. Will monitor.

## 2022-12-09 NOTE — Progress Notes (Signed)
  Progress Note   Patient: Ethan Alvarez WUJ:[id number] DOB: 1961/01/12 DOA: 12/06/2022     3 DOS: the patient was seen and examined on 12/09/2022   Brief hospital course:  Ethan Alvarez is a 61 y.o. male with medical history significant for schizophrenia, hypertension, obesity who presents to the emergency room via EMS for evaluation of several falls over the last couple of days and inability to ambulate. Patient was seen in the ER, 2 days prior to this admission after he fell and stubbed his toe and was noted to have some redness and swelling over his right foot.  He had a right foot x-ray which showed a closed nondisplaced fracture of the proximal phalanx of the right great toe and was discharged home on a cam boot and oral antibiotics for cellulitis. Patient's brother states that since his discharge home he has had difficulty getting around and has had several falls.   Patient is seen by podiatry, recommended 7 days of doxycycline.  However, patient has significant weakness, PT has recommended SNF placement.    Assessment and Plan: * Cellulitis and abscess of foot Acute fracture of right first proximal phalanx  Frequent falls Patient has no evidence of osteomyelitis.  Doxycycline for 7 days. Patient has been eval by physical therapy, recommended nursing home placement. No new issues.     Thrombocytopenia (HCC) Stable, continue to follow   Schizophrenia (HCC) Borderline intellectual functioning Continue home medicines   Obesity (BMI 30.0-34.9) Estimated body mass index is 32.99 kg/m as calculated from the following:   Height as of this encounter: 5\' 10"  (1.778 m).   Weight as of this encounter: 104.3 kg.         Subjective:  Slept well last night, still has some pain, taking pain medicine.  No shortness of breath  Physical Exam: Vitals:   12/08/22 1704 12/08/22 2040 12/09/22 0016 12/09/22 0831  BP: 122/67 116/61 125/66 (!) 113/59  Pulse: 87 90 93 88  Resp: 17  17  20   Temp: 98.3 F (36.8 C)  98.2 F (36.8 C) 98.2 F (36.8 C)  TempSrc:      SpO2: 92%  90% 91%  Weight:      Height:       General exam: Appears calm and comfortable  Respiratory system: Clear to auscultation. Respiratory effort normal. Cardiovascular system: S1 & S2 heard, RRR. No JVD, murmurs, rubs, gallops or clicks. Gastrointestinal system: Abdomen is nondistended, soft and nontender. No organomegaly or masses felt. Normal bowel sounds heard. Central nervous system: Alert and oriented. No focal neurological deficits. Extremities: Right foot swelling. Skin: No rashes, lesions or ulcers Psychiatry: Judgement and insight appear normal. Mood & affect appropriate.   Data Reviewed:  There are no new results to review at this time.  Family Communication: None  Disposition: Status is: Inpatient Remains inpatient appropriate because: Unsafe discharge.  Planned Discharge Destination: Skilled nursing facility    Time spent: 28 minutes  Author: 12/11/22, MD 12/09/2022 12:10 PM  For on call review [email or web address].

## 2022-12-10 ENCOUNTER — Inpatient Hospital Stay: Payer: Medicare Other

## 2022-12-10 DIAGNOSIS — A419 Sepsis, unspecified organism: Secondary | ICD-10-CM

## 2022-12-10 DIAGNOSIS — J9601 Acute respiratory failure with hypoxia: Secondary | ICD-10-CM

## 2022-12-10 DIAGNOSIS — J690 Pneumonitis due to inhalation of food and vomit: Secondary | ICD-10-CM

## 2022-12-10 DIAGNOSIS — L03119 Cellulitis of unspecified part of limb: Secondary | ICD-10-CM | POA: Diagnosis not present

## 2022-12-10 DIAGNOSIS — G928 Other toxic encephalopathy: Secondary | ICD-10-CM | POA: Diagnosis not present

## 2022-12-10 DIAGNOSIS — F209 Schizophrenia, unspecified: Secondary | ICD-10-CM | POA: Diagnosis not present

## 2022-12-10 LAB — CBC
HCT: 40.9 % (ref 39.0–52.0)
Hemoglobin: 13.8 g/dL (ref 13.0–17.0)
MCH: 32.5 pg (ref 26.0–34.0)
MCHC: 33.7 g/dL (ref 30.0–36.0)
MCV: 96.2 fL (ref 80.0–100.0)
Platelets: 108 10*3/uL — ABNORMAL LOW (ref 150–400)
RBC: 4.25 MIL/uL (ref 4.22–5.81)
RDW: 12.4 % (ref 11.5–15.5)
WBC: 4.6 10*3/uL (ref 4.0–10.5)
nRBC: 0.4 % — ABNORMAL HIGH (ref 0.0–0.2)

## 2022-12-10 LAB — BASIC METABOLIC PANEL
Anion gap: 10 (ref 5–15)
BUN: 21 mg/dL — ABNORMAL HIGH (ref 6–20)
CO2: 25 mmol/L (ref 22–32)
Calcium: 8.8 mg/dL — ABNORMAL LOW (ref 8.9–10.3)
Chloride: 102 mmol/L (ref 98–111)
Creatinine, Ser: 0.96 mg/dL (ref 0.61–1.24)
GFR, Estimated: >60 mL/min (ref >60)
Glucose, Bld: 112 mg/dL — ABNORMAL HIGH (ref 70–99)
Potassium: 4.8 mmol/L (ref 3.5–5.1)
Sodium: 137 mmol/L (ref 135–145)

## 2022-12-10 LAB — BLOOD GAS, ARTERIAL
Acid-Base Excess: 5 mmol/L — ABNORMAL HIGH (ref 0.0–2.0)
Bicarbonate: 29.9 mmol/L — ABNORMAL HIGH (ref 20.0–28.0)
O2 Content: 6 L/min
O2 Saturation: 97.3 %
Patient temperature: 37
pCO2 arterial: 44 mmHg (ref 32–48)
pH, Arterial: 7.44 (ref 7.35–7.45)
pO2, Arterial: 82 mmHg — ABNORMAL LOW (ref 83–108)

## 2022-12-10 LAB — RESP PANEL BY RT-PCR (RSV, FLU A&B, COVID)  RVPGX2
Influenza A by PCR: NEGATIVE
Influenza B by PCR: NEGATIVE
Resp Syncytial Virus by PCR: NEGATIVE
SARS Coronavirus 2 by RT PCR: NEGATIVE

## 2022-12-10 LAB — LACTIC ACID, PLASMA: Lactic Acid, Venous: 0.9 mmol/L (ref 0.5–1.9)

## 2022-12-10 LAB — MRSA NEXT GEN BY PCR, NASAL: MRSA by PCR Next Gen: NOT DETECTED

## 2022-12-10 LAB — GLUCOSE, CAPILLARY: Glucose-Capillary: 131 mg/dL — ABNORMAL HIGH (ref 70–99)

## 2022-12-10 LAB — D-DIMER, QUANTITATIVE: D-Dimer, Quant: 0.65 ug/mL-FEU — ABNORMAL HIGH (ref 0.00–0.50)

## 2022-12-10 LAB — CK: Total CK: 103 U/L (ref 49–397)

## 2022-12-10 LAB — PROCALCITONIN: Procalcitonin: <0.1 ng/mL

## 2022-12-10 MED ORDER — ACETAMINOPHEN 650 MG RE SUPP
650.0000 mg | Freq: Once | RECTAL | Status: AC
  Administered 2022-12-10: 650 mg via RECTAL
  Filled 2022-12-10: qty 1

## 2022-12-10 MED ORDER — IPRATROPIUM-ALBUTEROL 0.5-2.5 (3) MG/3ML IN SOLN
3.0000 mL | Freq: Four times a day (QID) | RESPIRATORY_TRACT | Status: DC
  Administered 2022-12-10 – 2022-12-11 (×4): 3 mL via RESPIRATORY_TRACT
  Filled 2022-12-10 (×4): qty 3

## 2022-12-10 MED ORDER — PIPERACILLIN-TAZOBACTAM 3.375 G IVPB
3.3750 g | Freq: Three times a day (TID) | INTRAVENOUS | Status: DC
  Administered 2022-12-10 – 2022-12-11 (×4): 3.375 g via INTRAVENOUS
  Filled 2022-12-10 (×4): qty 50

## 2022-12-10 MED ORDER — METHYLPREDNISOLONE SODIUM SUCC 40 MG IJ SOLR
40.0000 mg | INTRAMUSCULAR | Status: AC
  Administered 2022-12-10: 40 mg via INTRAVENOUS
  Filled 2022-12-10: qty 1

## 2022-12-10 MED ORDER — ROCURONIUM BROMIDE 10 MG/ML (PF) SYRINGE
PREFILLED_SYRINGE | INTRAVENOUS | Status: AC
  Filled 2022-12-10: qty 10

## 2022-12-10 MED ORDER — IPRATROPIUM-ALBUTEROL 0.5-2.5 (3) MG/3ML IN SOLN
3.0000 mL | RESPIRATORY_TRACT | Status: DC
  Administered 2022-12-10 (×2): 3 mL via RESPIRATORY_TRACT
  Filled 2022-12-10 (×2): qty 3

## 2022-12-10 MED ORDER — SODIUM CHLORIDE 0.9 % IV SOLN: INTRAVENOUS | Status: DC | PRN

## 2022-12-10 MED ORDER — BUDESONIDE 0.5 MG/2ML IN SUSP
0.5000 mg | Freq: Two times a day (BID) | RESPIRATORY_TRACT | Status: DC
  Administered 2022-12-10 – 2022-12-15 (×9): 0.5 mg via RESPIRATORY_TRACT
  Filled 2022-12-10 (×11): qty 2

## 2022-12-10 MED ORDER — ETOMIDATE 2 MG/ML IV SOLN
INTRAVENOUS | Status: AC
  Filled 2022-12-10: qty 10

## 2022-12-10 MED ORDER — LORAZEPAM 2 MG/ML IJ SOLN
1.0000 mg | Freq: Four times a day (QID) | INTRAMUSCULAR | Status: DC | PRN
  Administered 2022-12-13 – 2022-12-15 (×3): 1 mg via INTRAMUSCULAR
  Filled 2022-12-10 (×3): qty 1

## 2022-12-10 MED ORDER — CHLORHEXIDINE GLUCONATE CLOTH 2 % EX PADS
6.0000 | MEDICATED_PAD | Freq: Every day | CUTANEOUS | Status: DC
  Administered 2022-12-10 – 2022-12-18 (×6): 6 via TOPICAL

## 2022-12-10 MED ORDER — SODIUM CHLORIDE 0.9 % IV SOLN
3.0000 g | Freq: Four times a day (QID) | INTRAVENOUS | Status: DC
  Administered 2022-12-10: 3 g via INTRAVENOUS
  Filled 2022-12-10: qty 3

## 2022-12-10 MED ORDER — IPRATROPIUM-ALBUTEROL 0.5-2.5 (3) MG/3ML IN SOLN: 3.0000 mL | RESPIRATORY_TRACT | Status: DC | PRN

## 2022-12-10 NOTE — Progress Notes (Signed)
   12/10/22 0145  Assess: MEWS Score  Resp (!) 44  Level of Consciousness Responds to Voice  SpO2 (!) 89 %  O2 Device Nasal Cannula  O2 Flow Rate (L/min) 3 L/min  Assess: MEWS Score  MEWS Temp 0  MEWS Systolic 0  MEWS Pulse 0  MEWS RR 3  MEWS LOC 1  MEWS Score 4  MEWS Score Color Red  Assess: if the MEWS score is Yellow or Red  Were vital signs taken at a resting state? Yes  Focused Assessment No change from prior assessment  Does the patient meet 2 or more of the SIRS criteria? Yes  Does the patient have a confirmed or suspected source of infection? Yes  Provider and Rapid Response Notified? No  MEWS guidelines implemented *See Row Information* Yes  Treat  MEWS Interventions Escalated (See documentation below)  Pain Scale Faces  Pain Score 0  Faces Pain Scale 0  Take Vital Signs  Increase Vital Sign Frequency  Red: Q 1hr X 4 then Q 4hr X 4, if remains red, continue Q 4hrs  Escalate  MEWS: Escalate Red: discuss with charge nurse/RN and provider, consider discussing with RRT  Notify: Charge Nurse/RN  Name of Charge Nurse/RN Notified Erie Noe, RN  Date Charge Nurse/RN Notified 12/10/22  Time Charge Nurse/RN Notified 0145  Provider Notification  Provider Name/Title Bishop Limbo, RN  Date Provider Notified 12/10/22  Time Provider Notified 0203  Method of Notification  (Secure chat and ICU nurse called Kela Millin, NP at bedside)  Notification Reason Change in status  Provider response At bedside;See new orders  Date of Provider Response 12/10/22  Time of Provider Response 0241  Notify: Rapid Response  Name of Rapid Response RN Notified Lyla Son- At bedside  Date Rapid Response Notified 12/10/22  Time Rapid Response Notified 0157  Document  Patient Outcome Transferred/level of care increased  Progress note created (see row info) Yes  Assess: SIRS CRITERIA  SIRS Temperature  0  SIRS Pulse 1  SIRS Respirations  1  SIRS WBC 0  SIRS Score Sum  2

## 2022-12-10 NOTE — Progress Notes (Signed)
Progress Note   Patient: Ethan Alvarez OAC:[id number] DOB: 1961-02-25 DOA: 12/06/2022     4 DOS: the patient was seen and examined on 12/10/2022   Brief hospital course:  Ethan Alvarez is a 61 y.o. male with medical history significant for schizophrenia, hypertension, obesity who presents to the emergency room via EMS for evaluation of several falls over the last couple of days and inability to ambulate. Patient was seen in the ER, 2 days prior to this admission after he fell and stubbed his toe and was noted to have some redness and swelling over his right foot.  He had a right foot x-ray which showed a closed nondisplaced fracture of the proximal phalanx of the right great toe and was discharged home on a cam boot and oral antibiotics for cellulitis. Patient's brother states that since his discharge home he has had difficulty getting around and has had several falls.   Patient is seen by podiatry, recommended 7 days of doxycycline.  However, patient has significant weakness, PT has recommended SNF placement. Patient spiked high fever at the night of 12/24.  Started Zosyn for aspiration pneumonia.   Assessment and Plan: Sepsis secondary to aspiration pneumonia. Not POA Aspiration pneumonia of bilateral lower lobes. Not POA Acute respiratory failure with hypoxemia secondary to aspiration pneumonia. Not POA Acute metabolic encephalopathy secondary to sepsis. Patient is spiked high fever of 102 last night, he also had significant respite distress with a respiratory rate of 44, heart rate 100, met sepsis criteria.  He also had an increased oxygen requirement up to 6 L.  He also was unresponsive and transferred to intensive care unit. Patient was treated with Zosyn, mental status has improved today.  Vital signs more stable.  Will continue current antibiotics, obtain speech therapy evaluation. Patient can transfer back to regular medical floor today.  Cellulitis and abscess of foot Acute  fracture of right first proximal phalanx  Frequent falls Patient has no evidence of osteomyelitis.  Doxycycline for 7 days. Patient has been eval by physical therapy, recommended nursing home placement. Restarted on Zosyn.  Discussed with Dr. Sherryle Alvarez, he will look at the patient foot again to make sure is not having worsening infection.     Thrombocytopenia (HCC) Stable, continue to follow   Schizophrenia ([place]) Borderline intellectual functioning Continue home medicines   Obesity (BMI 30.0-34.9) Estimated body mass index is 32.99 kg/m as calculated from the following:   Height as of this encounter: _0  (1.778 m).   Weight as of this encounter: 104.3 kg.      Subjective:  Patient had an episode of high fever last night, short of breath and hypoxemia.  Become unresponsive.  Placed on high flow oxygen, given Zosyn.  Mental status has been improved today.  Currently, patient is still on 4 L oxygen, but overall feels better.  Physical Exam: Vitals:   12/10/22 0700 12/10/22 0800 12/10/22 0900 12/10/22 1000  BP: (!) 100/53 (!) 114/55 110/63 (!) 97/59  Pulse: 76 77 84 76  Resp: (!) 29 (!) 25 (!) 23 20  Temp: 99.3 F (37.4 C) 99.1 F (37.3 C)    TempSrc:  Axillary    SpO2: 97% 94% 95% 95%  Weight:      Height:       General exam: Appears calm and comfortable  Respiratory system: Clear to auscultation. Respiratory effort normal. Cardiovascular system: S1 & S2 heard, RRR. No JVD, murmurs, rubs, gallops or clicks. No pedal edema. Gastrointestinal system: Abdomen is  nondistended, soft and nontender. No organomegaly or masses felt. Normal bowel sounds heard. Central nervous system: Alert and oriented x3. No focal neurological deficits. Extremities: Symmetric 5 x 5 power. Skin: No rashes, lesions or ulcers Psychiatry: Judgement and insight appear normal. Mood & affect appropriate.   Data Reviewed:  Chest x-ray and lab results reviewed.  Family Communication: sister updated.    Disposition: Status is: Inpatient Remains inpatient appropriate because: Severity of disease, IV treatment.  Planned Discharge Destination: Skilled nursing facility    Time spent: 50 minutes  Author: Sharen Hones, MD 12/10/2022 10:54 AM  For on call review [email or web address].

## 2022-12-10 NOTE — Progress Notes (Signed)
Pharmacy Antibiotic Note  Ethan Alvarez is a 61 y.o. male admitted on 12/06/2022 with pneumonia.  Pharmacy has been consulted for Zosyn dosing.  Plan: Zosyn 3.375g IV q8h (4 hour infusion).  Height: 5\' 10"  (177.8 cm) Weight: 104.3 kg (229 lb 15 oz) IBW/kg (Calculated) : 73  Temp (24hrs), Avg:99.4 F (37.4 C), Min:97.6 F (36.4 C), Max:101.6 F (38.7 C)  Recent Labs  Lab 12/04/22 1043 12/06/22 0927 12/07/22 0628 12/08/22 0549 12/10/22 0336 12/10/22 0406  WBC 4.6 3.4* 3.3* 3.7*  --  4.6  CREATININE 0.71 0.83 0.91  --  0.96  --   LATICACIDVEN  --   --   --   --  0.9  --     Estimated Creatinine Clearance: 99 mL/min (by C-G formula based on SCr of 0.96 mg/dL).    Allergies  Allergen Reactions   Haloperidol Lactate Other (See Comments)    Per [facility]; nature of intolerance unclear intolerance Other reaction(s): Other (See Comments) Per [facility]; nature of intolerance unclear     Antimicrobials this admission:   >>    >>   Dose adjustments this admission:   Microbiology results:  BCx:   UCx:    Sputum:    MRSA PCR:   Thank you for allowing pharmacy to be a part of this patient's care.  Ayzia Day D 12/10/2022 4:58 AM

## 2022-12-10 NOTE — Consult Note (Signed)
NAME:  Ethan Alvarez, MRN:  [id number], DOB:  [birth year], LOS: 4 ADMISSION DATE:  12/06/2022, CONSULTATION DATE: 12/10/22 REFERRING MD: Kela Millin, NP, CHIEF COMPLAINT: Respiratory Failure   History of Present Illness:  This is a 61 yo male with a PMH of HTN, Cognitive Impairment, and Schizophrenia.  He presented to [facility] ER on 12/21 via EMS from home with worsening cognition, frequent falls, and difficulty with ambulation following a right toe injury.  He was previously evaluated in the ER on 12/19 with a stubbed right great toe with overlying skin tear and cellulitic changes. X-ray of right foot revealing acute fracture involving the medial base of the first proximal phalanx with fracture line extending into the first MTP joint. Venous US of bilateral lower extremities negative for DVT.  He did not require hospital admission at that time, and was deemed appropriate for outpatient follow-up.  He was discharged on a course of cephalexin 500 mg tid. When he returned to the ER pts sister reported the pt had tremors 3-4 days prior to presentation, and was concerned he may require placement.   ED Course Upon arrival to the ER lab results revealed glucose 111/wbc 3.4/platelets 84.  CT Head negative for acute findings. UA negative for UTI.  He received cephalexin in the ER.  Pt admitted to the [facility] unit for additional workup and treatment.   Pertinent  Medical History  Schizophrenia Cognitive Impairment HTN  Falls  Significant Hospital Events: Including procedures, antibiotic start and stop dates in addition to other pertinent events   12/21: Pt admitted with acute closed fracture of the right first proximal phalanx and mild cellulitis  12/22: Podiatry consulted fracture blister lanced and drained, recommended ancef, WBAT in postop shoe, discharge on doxycycline for 7 day course and follow-up in office as outpatient in 1-2 weeks  12/23: PT recommended SNF placement due to significant weakness   12/24: Pt developed acute hypoxic respiratory failure requiring 2L O2 via nasal canula  12/25: Pt transferred to the stepdown unit due to acute encephalopathy and worsening acute hypoxic respiratory failure secondary to aspiration pneumonia. Requiring increase in O2 from 2L to 6L PCCM team consulted pt HIGH Risk for mechanical intubation   Interim History / Subjective:  Pt somnolent but able to follow commands.  He is currently in respiratory distress with accessory muscle use respiratory rate 35 to 44 on 6L O2 via nasal canula. Pt febrile temp 102.4 F. ABG revealed pH 7.44/pCO2 44/pO2 82/acid-base excess 5.0/bicarb 29.9  Objective   Blood pressure (!) 100/47, pulse 78, temperature 98.1 F (36.7 C), temperature source Axillary, resp. rate (!) 26, height 5\' 10"  (1.778 m), weight 104.3 kg, SpO2 99 %.        Intake/Output Summary (Last 24 hours) at 12/10/2022 12/12/2022 Last data filed at 12/10/2022 0500 Gross per 24 hour  Intake 103.9 ml  Output 1200 ml  Net -1096.1 ml   Filed Weights   12/06/22 0924  Weight: 104.3 kg    Examination: General: Acutely-ill appearing male, in severe respiratory distress HENT: Supple, mild JVD  Lungs: Diffuse rhonchi throughout, tachypneic, and labored  Cardiovascular: NSR, s1s2, no m/r/g, 2+ radial/1+ distal pulses, no edema  Abdomen: +BS x4, obese, soft, non tender, non distended  Extremities: Moves all extremities, normal bulk  Skin: Right great toe blister dressing dry/intact  Neuro: Somnolent, follows commands, PERRL GU: Voiding   Resolved Hospital Problem list     Assessment & Plan:  Acute hypoxic respiratory failure secondary to aspiration  pneumonia  - Supplemental O2 for dyspnea and/or hypoxia  - Maintain O2 sats >92% - Scheduled and prn bronchodilator therapy - Nebulized steroids  - NT suctioning prn  - Pulmonary toilet  - Aspiration precautions   Right great toe cellulitis Aspiration pneumonia - Trend WBC and monitor fever curve -  Trend PCT  - Follow cultures  - Abx broadened to zosyn 12/25  Thrombocytopenia  - Trend CBC - SCD's for VTE px - Monitor for s/sx of bleeding   Frequent falls - PT/OT  - PT recommending SNF placement  - Fall precautions   Acute toxic metabolic encephalopathy Hx: Schizophrenia and cognitive impairment  - Ativan discontinued for now due to decline in mentation; continue other outpatient psychiatric medications  Best Practice (right click and "Reselect all SmartList Selections" daily)   Diet/type: NPO DVT prophylaxis: SCD GI prophylaxis: N/A Lines: N/A Foley:  N/A Code Status:  full code Last date of multidisciplinary goals of care discussion [N/A]  Labs   CBC: Recent Labs  Lab 12/04/22 1043 12/06/22 0927 12/07/22 0628 12/08/22 0549 12/10/22 0406  WBC 4.6 3.4* 3.3* 3.7* 4.6  NEUTROABS 3.3  --   --   --   --   HGB 14.3 14.3 12.6* 12.1* 13.8  HCT 42.2 43.3 38.2* 35.8* 40.9  MCV 96.6 98.6 97.9 96.8 96.2  PLT 84* 90* 84* 95* 108*    Basic Metabolic Panel: Recent Labs  Lab 12/04/22 1043 12/06/22 0927 12/07/22 0628 12/10/22 0336  NA 137 141 141 137  K 3.8 4.2 3.6 4.8  CL 101 101 103 102  CO2 28 30 29 25   GLUCOSE 141* 111* 105* 112*  BUN 15 20 22* 21*  CREATININE 0.71 0.83 0.91 0.96  CALCIUM 9.0 9.2 8.6* 8.8*   GFR: Estimated Creatinine Clearance: 99 mL/min (by C-G formula based on SCr of 0.96 mg/dL). Recent Labs  Lab 12/06/22 0927 12/07/22 0628 12/08/22 0549 12/10/22 0336 12/10/22 0406  PROCALCITON  --   --   --  <0.10  --   WBC 3.4* 3.3* 3.7*  --  4.6  LATICACIDVEN  --   --   --  0.9  --     Liver Function Tests: Recent Labs  Lab 12/04/22 1043  AST 28  ALT 30  ALKPHOS 37*  BILITOT 0.9  PROT 7.1  ALBUMIN 3.5   No results for input(s): "LIPASE", "AMYLASE" in the last 168 hours. No results for input(s): "AMMONIA" in the last 168 hours.  ABG    Component Value Date/Time   PHART 7.44 12/10/2022 0300   PCO2ART 44 12/10/2022 0300   PO2ART  82 (L) 12/10/2022 0300   HCO3 29.9 (H) 12/10/2022 0300   O2SAT 97.3 12/10/2022 0300     Coagulation Profile: No results for input(s): "INR", "PROTIME" in the last 168 hours.  Cardiac Enzymes: Recent Labs  Lab 12/10/22 0450  CKTOTAL 103    HbA1C: No results found for: "HGBA1C"  CBG: Recent Labs  Lab 12/10/22 0350  GLUCAP 131*    Review of Systems:   Unable to assess due to mentation  Past Medical History:  He,  has a past medical history of Hypertension and Schizophrenia (HCC).   Surgical History:   Past Surgical History:  Procedure Laterality Date   INCISION AND DRAINAGE Right 06/02/2019   Procedure: INCISION AND DRAINAGE;  Surgeon: 06/04/2019, MD;  Location: [facility] ORS;  Service: Orthopedics;  Laterality: Right;   QUADRICEPS TENDON REPAIR Right 04/25/2019   Procedure: REPAIR QUADRICEP  TENDON;  Surgeon: Christena Flake, MD;  Location: [facility] ORS;  Service: Orthopedics;  Laterality: Right;   QUADRICEPS TENDON REPAIR Right 06/02/2019   Procedure: REPAIR QUADRICEP TENDON;  Surgeon: Christena Flake, MD;  Location: [facility] ORS;  Service: Orthopedics;  Laterality: Right;     Social History:   reports that he has never smoked. He has never used smokeless tobacco. He reports that he does not currently use alcohol. He reports that he does not currently use drugs.   Family History:  His family history is not on file.   Allergies Allergies  Allergen Reactions   Haloperidol Lactate Other (See Comments)    Per [facility]; nature of intolerance unclear intolerance Other reaction(s): Other (See Comments) Per [facility]; nature of intolerance unclear      Home Medications  Prior to Admission medications   Medication Sig Start Date End Date Taking? Authorizing Provider  acetaminophen (TYLENOL) 325 MG tablet Take 650 mg by mouth every 6 (six) hours as needed for mild pain, headache or fever.   Yes [provider]  ascorbic acid (VITAMIN C) 500 MG tablet Take 250 mg by mouth daily.   Yes  [provider]  benztropine (COGENTIN) 0.5 MG tablet Take 0.5 mg by mouth every morning.    Yes [provider]  benztropine (COGENTIN) 1 MG tablet Take 1 mg by mouth 2 (two) times daily.  03/25/19  Yes [provider]  cephALEXin (KEFLEX) 500 MG capsule Take 1 capsule (500 mg total) by mouth 3 (three) times daily for 7 days. 12/04/22 12/11/22 Yes Willy Eddy, MD  cholecalciferol (VITAMIN D3) 25 MCG (1000 UT) tablet Take 2,000 Units by mouth daily.   Yes [provider]  divalproex (DEPAKOTE) 500 MG DR tablet Take 1,000 mg by mouth 2 (two) times daily. 06/12/21  Yes [provider]  levothyroxine (SYNTHROID) 25 MCG tablet Take 25 mcg by mouth daily before breakfast.   Yes [provider]  LORazepam (ATIVAN) 1 MG tablet Take 2 tablets (2 mg total) by mouth in the morning, at noon, and at bedtime. Takes at 0800, 1400, 2000 Patient taking differently: Take 1 mg by mouth in the morning, at noon, and at bedtime. Takes at 0800, 1400, 2000 12/31/20  Yes Enedina Finner, MD  OLANZapine (ZYPREXA) 5 MG tablet Take 5 mg by mouth See admin instructions. 5 mg 0800 and 1400   Yes [provider]  paliperidone (INVEGA) 6 MG 24 hr tablet Take 6 mg by mouth daily.   Yes [provider]  paliperidone (INVEGA) 9 MG 24 hr tablet Take 9 mg by mouth at bedtime.   Yes [provider]  propranolol (INDERAL) 20 MG tablet Take 20 mg by mouth 2 (two) times daily. 08/12/18  Yes [provider]  sertraline (ZOLOFT) 100 MG tablet Take 1 tablet by mouth twice daily - at 8am AND at 8pm 06/12/21  Yes [provider]  solifenacin (VESICARE) 5 MG tablet Take 5 mg by mouth at bedtime.   Yes [provider]  thiamine 100 MG tablet Take 100 mg by mouth daily.   Yes [provider]  vitamin B-12 (CYANOCOBALAMIN) 100 MCG tablet Take 100 mcg by mouth daily.   Yes [provider]  divalproex (DEPAKOTE) 500 MG DR tablet  Take 1,000 mg by mouth 2 (two) times a day. 02/13/17 06/28/21  [provider]  LORazepam (ATIVAN) 1 MG tablet Take 1 mg by mouth 2 (two) times daily as needed for anxiety.  06/12/21   [provider]  meloxicam (MOBIC) 15 MG tablet Take 1 tablet (15 mg total) by mouth daily. Patient not taking: Reported on 12/06/2022 11/17/21   Racheal Patchesuthriell, Jonathan D, PA-C     Critical care time: 60 minutes  Zada Girtana Nelson, [facility] - EncinitasGNP  Pulmonary/Critical Care Pager [phone] (please enter 7 digits) PCCM Consult Pager [phone] (please enter 7 digits)

## 2022-12-10 NOTE — Progress Notes (Addendum)
       CROSS COVER NOTE  NAME: Ethan Alvarez MRN: [id number] DOB : 18-May-1961 ATTENDING PHYSICIAN: Marrion Coy, MD    Date of Service   12/10/2022   HPI/Events of Note   Called to bedside for Increased work of breathing. RR 35-40 with accessory muscle use. Ethan Alvarez had a new O2 requirement yesterday of 2L and has required titration up to 6L tonight to maintain sats >90%. Yesterday there was concern for aspiration, CXR obtained at that time was clear. Tonight Ethan Alvarez is febrile with Tmax 39.1C.  Ethan Alvarez responds to vigorous stimulation, tells me his first name and quickly falls back asleep.   Ethan Alvarez is a 61 y.o. male with medical history significant for schizophrenia, hypertension, obesity who presents to the emergency room via EMS for evaluation of several falls over the last couple of days and inability to ambulate. Patient was seen in the ER, 2 days prior to this admission after he fell and stubbed his toe and was noted to have some redness and swelling over his right foot.  He had a right foot x-ray which showed a closed nondisplaced fracture of the proximal phalanx of the right great toe and was discharged home on a cam boot and oral antibiotics for cellulitis. He is currently being treated for cellulitus of his foot.  Interventions   Assessment/Plan:   CXR--> Left lung base density, likely atelectasis, Infiltrate not excluded ABG - pH 7.44 pCO2 44 pO2 82 Bicarbonate 29.9 O2 Saturation 97.3  Acetaminophen suppository Unasyn per pharmacy BMP, CBC, Lactic-->0.9, Procalcitonin--> <0.10, Blood Cultures COVID, Flu, RSV Panel- Negative PCCM consulted- High risk for intubation. Appreciate input and recommendations.        To reach the provider On-Call:   7AM- 7PM see care teams to locate the attending and reach out to them via [email or web address]. 7PM-7AM contact night-coverage If you still have difficulty reaching the appropriate provider, please page the [facility] (Director on Call)  for [facility] on amion for assistance  This document was prepared using Sales executive software and may include unintentional dictation errors.  Bishop Limbo DNP, MBA, FNP-BC Nurse Practitioner [facility] Pager [phone]

## 2022-12-10 NOTE — Progress Notes (Signed)
Pharmacy Antibiotic Note  Ethan Alvarez is a 61 y.o. male admitted on 12/06/2022 with  aspiration PNA .  Pharmacy has been consulted for Unasyn dosing.  Plan: Unasyn 3 gm IV Q6H ordered to start on 12/25 @ 0400.   Height: 5\' 10"  (177.8 cm) Weight: 104.3 kg (229 lb 15 oz) IBW/kg (Calculated) : 73  Temp (24hrs), Avg:99.4 F (37.4 C), Min:97.6 F (36.4 C), Max:101.6 F (38.7 C)  Recent Labs  Lab 12/04/22 1043 12/06/22 0927 12/07/22 0628 12/08/22 0549  WBC 4.6 3.4* 3.3* 3.7*  CREATININE 0.71 0.83 0.91  --     Estimated Creatinine Clearance: 104.4 mL/min (by C-G formula based on SCr of 0.91 mg/dL).    Allergies  Allergen Reactions   Haloperidol Lactate Other (See Comments)    Per [facility]; nature of intolerance unclear intolerance Other reaction(s): Other (See Comments) Per [facility]; nature of intolerance unclear     Antimicrobials this admission:   >>    >>   Dose adjustments this admission:   Microbiology results:  BCx:   UCx:    Sputum:    MRSA PCR:   Thank you for allowing pharmacy to be a part of this patient's care.  Mikella Linsley D 12/10/2022 3:58 AM

## 2022-12-10 NOTE — Plan of Care (Signed)

## 2022-12-10 NOTE — Plan of Care (Signed)
  Problem: Clinical Measurements: Goal: Ability to maintain clinical measurements within normal limits will improve Outcome: Progressing Goal: Will remain free from infection Outcome: Progressing   Problem: Pain Managment: Goal: General experience of comfort will improve Outcome: Progressing   Problem: Safety: Goal: Ability to remain free from injury will improve Outcome: Progressing   Problem: Skin Integrity: Goal: Risk for impaired skin integrity will decrease Outcome: Progressing   

## 2022-12-11 DIAGNOSIS — F411 Generalized anxiety disorder: Secondary | ICD-10-CM | POA: Diagnosis not present

## 2022-12-11 DIAGNOSIS — J9601 Acute respiratory failure with hypoxia: Secondary | ICD-10-CM | POA: Diagnosis not present

## 2022-12-11 DIAGNOSIS — L02619 Cutaneous abscess of unspecified foot: Secondary | ICD-10-CM | POA: Diagnosis not present

## 2022-12-11 DIAGNOSIS — J690 Pneumonitis due to inhalation of food and vomit: Secondary | ICD-10-CM | POA: Diagnosis not present

## 2022-12-11 DIAGNOSIS — L03119 Cellulitis of unspecified part of limb: Secondary | ICD-10-CM | POA: Diagnosis not present

## 2022-12-11 DIAGNOSIS — S92414A Nondisplaced fracture of proximal phalanx of right great toe, initial encounter for closed fracture: Secondary | ICD-10-CM

## 2022-12-11 LAB — CBC
HCT: 38 % — ABNORMAL LOW (ref 39.0–52.0)
Hemoglobin: 12.9 g/dL — ABNORMAL LOW (ref 13.0–17.0)
MCH: 32.7 pg (ref 26.0–34.0)
MCHC: 33.9 g/dL (ref 30.0–36.0)
MCV: 96.2 fL (ref 80.0–100.0)
Platelets: 100 10*3/uL — ABNORMAL LOW (ref 150–400)
RBC: 3.95 MIL/uL — ABNORMAL LOW (ref 4.22–5.81)
RDW: 12 % (ref 11.5–15.5)
WBC: 4.7 10*3/uL (ref 4.0–10.5)
nRBC: 0 % (ref 0.0–0.2)

## 2022-12-11 LAB — BASIC METABOLIC PANEL
Anion gap: 11 (ref 5–15)
BUN: 23 mg/dL — ABNORMAL HIGH (ref 6–20)
CO2: 26 mmol/L (ref 22–32)
Calcium: 8.7 mg/dL — ABNORMAL LOW (ref 8.9–10.3)
Chloride: 103 mmol/L (ref 98–111)
Creatinine, Ser: 0.85 mg/dL (ref 0.61–1.24)
GFR, Estimated: >60 mL/min (ref >60)
Glucose, Bld: 94 mg/dL (ref 70–99)
Potassium: 4.1 mmol/L (ref 3.5–5.1)
Sodium: 140 mmol/L (ref 135–145)

## 2022-12-11 LAB — MAGNESIUM: Magnesium: 2.4 mg/dL (ref 1.7–2.4)

## 2022-12-11 MED ORDER — SODIUM CHLORIDE 0.9 % IV SOLN
3.0000 g | Freq: Three times a day (TID) | INTRAVENOUS | Status: AC
  Administered 2022-12-11 – 2022-12-13 (×7): 3 g via INTRAVENOUS
  Filled 2022-12-11 (×2): qty 8
  Filled 2022-12-11: qty 3
  Filled 2022-12-11 (×3): qty 8
  Filled 2022-12-11: qty 3

## 2022-12-11 NOTE — Progress Notes (Signed)
Physical Therapy Treatment Patient Details Name: Ethan Alvarez MRN: [id number] DOB: 1961-03-17 Today's Date: 12/11/2022   History of Present Illness Ethan Alvarez is a 61 y.o. male with medical history significant for schizophrenia, hypertension, obesity who presents to the emergency room via EMS for evaluation of several falls over the last couple of days and inability to ambulate.  Patient was seen in the ER, 2 days prior to this admission after he fell and stubbed his toe and was noted to have some redness and swelling over his right foot.  He had a right foot x-ray which showed a closed nondisplaced fracture of the proximal phalanx of the right great toe and was discharged home on a cam boot.  Patient's brother states that since his discharge home he has had difficulty getting around and has had several falls.    PT Comments    Pt received in supine position and agreeable to therapy.  Pt very cooperative at the beginning of the session, however became agitated with therapist during the session and also at the end of the session.  Pt did participate with therapy and performed bed-level exercises, but by the end of the session he was getting agitated again and refused mobility outside of the bed.  Pt elected to defer sitting at the EOB as well.  Pt did calm down at conclusion and was pleasant when therapist was leaving.  Nursing notified of mood swing and states that he has done this in the past.  Current discharge plans to SNF remain appropriate at this time.  Pt will continue to benefit from skilled therapy in order to address deficits listed below.     Recommendations for follow up therapy are one component of a multi-disciplinary discharge planning process, led by the attending physician.  Recommendations may be updated based on patient status, additional functional criteria and insurance authorization.  Follow Up Recommendations  Skilled nursing-short term rehab (<3 hours/day) Can patient  physically be transported by private vehicle: No   Assistance Recommended at Discharge Frequent or constant Supervision/Assistance  Patient can return home with the following Two people to help with walking and/or transfers;Two people to help with bathing/dressing/bathroom   Equipment Recommendations       Recommendations for Other Services       Precautions / Restrictions Precautions Precautions: Fall Restrictions Weight Bearing Restrictions: No RLE Weight Bearing: Weight bearing as tolerated Other Position/Activity Restrictions: in surgical shoe     Mobility  Bed Mobility               General bed mobility comments: pt deferred any bed mobility this morning.    Transfers                        Ambulation/Gait                   Stairs             Wheelchair Mobility    Modified Rankin (Stroke Patients Only)       Balance                                            Cognition Arousal/Alertness: Awake/alert Behavior During Therapy: WFL for tasks assessed/performed Overall Cognitive Status: History of cognitive impairments - at baseline  General Comments: Fluctuating moods throughout the session.        Exercises General Exercises - Lower Extremity Ankle Circles/Pumps: AROM, Strengthening, Both, 10 reps, Supine Quad Sets: AROM, Strengthening, Both, 10 reps, Supine Gluteal Sets: AROM, Strengthening, Both, 10 reps, Supine Hip ABduction/ADduction: AROM, Strengthening, Both, 10 reps, Supine Straight Leg Raises: AROM, Strengthening, Both, 10 reps, Supine    General Comments        Pertinent Vitals/Pain Pain Assessment Pain Assessment: No/denies pain    Home Living                          Prior Function            PT Goals (current goals can now be found in the care plan section) Acute Rehab PT Goals Patient Stated Goal: to get stronger PT Goal  Formulation: With patient Time For Goal Achievement: 12/22/22 Potential to Achieve Goals: Fair Progress towards PT goals: Progressing toward goals    Frequency    Min 2X/week      PT Plan Current plan remains appropriate    Co-evaluation              AM-PAC PT "6 Clicks" Mobility   Outcome Measure  Help needed turning from your back to your side while in a flat bed without using bedrails?: A Lot Help needed moving from lying on your back to sitting on the side of a flat bed without using bedrails?: A Lot Help needed moving to and from a bed to a chair (including a wheelchair)?: Total Help needed standing up from a chair using your arms (e.g., wheelchair or bedside chair)?: Total Help needed to walk in hospital room?: Total Help needed climbing 3-5 steps with a railing? : Total 6 Click Score: 8    End of Session   Activity Tolerance: Patient tolerated treatment well Patient left: in bed;with bed alarm set Nurse Communication: Mobility status PT Visit Diagnosis: Unsteadiness on feet (R26.81);Repeated falls (R29.6);Muscle weakness (generalized) (M62.81);History of falling (Z91.81)     Time: 9450-3888 PT Time Calculation (min) (ACUTE ONLY): 14 min  Charges:  $Therapeutic Exercise: 8-22 mins                     Nolon Bussing, PT, DPT Physical Therapist- [facility]  [facility]  12/11/22, 11:51 AM

## 2022-12-11 NOTE — Consult Note (Signed)
   PODIATRY CONSULTATION  NAME Ethan Alvarez MRN [id number] DOB 24-Apr-1961 DOA 12/06/2022   CC: right great toe ulcer Chief Complaint  Patient presents with   Fall   History of present illness: 61 y.o. male PMHx schizophrenia, hypertension, obesity concern for redness and swelling right great toe. Xrays right foot taken 12/04/2022 positive for avulsion type fracture of the right great toe proximal phalanx. Podiatry consulted and recommend WBAT in surgical shoe and betadine w/ DSD daily.    Past Medical History:  Diagnosis Date   Hypertension    Schizophrenia (HCC)        Latest Ref Rng & Units 12/11/2022    3:30 AM 12/10/2022    4:06 AM 12/08/2022    5:49 AM  CBC  WBC 4.0 - 10.5 K/uL 4.7  4.6  3.7   Hemoglobin 13.0 - 17.0 g/dL 34.1  96.2  22.9   Hematocrit 39.0 - 52.0 % 38.0  40.9  35.8   Platelets 150 - 400 K/uL 100  108  95        Latest Ref Rng & Units 12/11/2022    3:30 AM 12/10/2022    3:36 AM 12/07/2022    6:28 AM  BMP  Glucose 70 - 99 mg/dL 94  798  921   BUN 6 - 20 mg/dL 23  21  22    Creatinine 0.61 - 1.24 mg/dL  1.94  1.74   Sodium 135 - 145 mmol/L 140  137  141   Potassium 3.5 - 5.1 mmol/L 4.1  4.8  3.6   Chloride 98 - 111 mmol/L 103  102  103   CO2 22 - 32 mmol/L 26  25  29    Calcium 8.9 - 10.3 mg/dL 8.7  8.8  8.6       Physical Exam: General: The patient is alert and oriented x3 in no acute distress.   Dermatology: Superficial eschar noted secondary to previous fracture blister. No erythema or current concern for acute cellulitis. Overall the area appears to be healing appropriately  Vascular: Palpable pedal pulses bilaterally. Moderate edema. No erythema noted. Capillary refill within normal limits.  Neurological: Grossly intact via light tough  Musculoskeletal Exam: No structural deformity noted.    ASSESSMENT/PLAN OF CARE Fracture right great toe w/ overlying fracture blister w/ routine healing - continue dressing with betadine and  light dressing - WBAT postsurgical shoe - TOC pending - podiatry will sign off. F/u in office 1-2 weeks postdischarge for further care and evaluation     Thank you for the consult.  Please contact me directly with any questions or concerns.  Cell [phone]   , DPM [facility]  Dr. [phone], DPM    [address] [place], [address] [place]                Office [phone]  Fax [phone]

## 2022-12-11 NOTE — Plan of Care (Signed)

## 2022-12-11 NOTE — Consult Note (Cosign Needed Addendum)
[facility] Face-to-Face Psychiatry Consult   Reason for Consult: Agitation Referring Physician:  Zhang Patient Identification: Ethan Alvarez MRN:  [id number] Principal Diagnosis: Anxiety state Diagnosis:  Principal Problem:   Anxiety state Active Problems:   Schizophrenia (HCC)   Sepsis (HCC)   Thrombocytopenia (HCC)   Borderline intellectual functioning   Obesity (BMI 30.0-34.9)   Frequent falls   Cellulitis and abscess of foot   Closed nondisplaced fracture of proximal phalanx of right great toe   Acute hypoxic respiratory failure (HCC)   Toxic metabolic encephalopathy   Aspiration pneumonia of both lower lobes due to regurgitated food (HCC)   Total Time spent with patient: 30 minutes  Subjective: "I know I am in the hospital." Ethan Alvarez is a 61 y.o. male patient admitted with abscess of foot, cellulitis.  HPI: Psychiatric consult on 61 year old man with schizophrenia and cognitive impairment at baseline who has been hospitalized after a fall, with active problems indicated above.  Report was that he had become agitated at times while in the hospital, so psych consult was made.  On evaluation, patient is laying in bed, generally calm and listening to music on the television.  Patient is alert and oriented to self, place and situation.  He is able to tell me that he has 2 brothers and a sister.  He gets slightly tearful when he says Clinical research associate looks like his at [place].  He states that she died.  Support provided to patient.   Patient states that he is here because he stepped his toe and he is here so his toe can mend.  He denies suicidal thoughts.  There have been no indication from staff that he has been having any unsafe behaviors.  Patient appears a little anxious, which may be more of what is causing something that appears to resemble agitation.  Patient is in unfamiliar surroundings and he says sometimes that "scares me."  Patient calms with gentle discussion and distraction to other  things.  He talks about how he loves classical music.  He talks about his little brother and big brother. Per chart review, patient has as needed Ativan on his medication list.  Will encourage staff to use it as needed.  Writer talked to the patient's brother, Dayan Desa ([phone]), who agrees with same.  [name] states that patient is "far from suicidal.  He states that it would be difficult for him to be more anxious when not in his familiar surroundings.  He also confirms that their aunt surely did pass away about a month ago and he would expect that patient is grieving.  Past Psychiatric History: Schizophrenia, borderline intellectual functioning  Risk to Self:   Risk to Others:   Prior Inpatient Therapy:   Prior Outpatient Therapy:    Past Medical History:  Past Medical History:  Diagnosis Date   Hypertension    Schizophrenia [facility])     Past Surgical History:  Procedure Laterality Date   INCISION AND DRAINAGE Right 06/02/2019   Procedure: INCISION AND DRAINAGE;  Surgeon: Christena Flake, MD;  Location: [facility] ORS;  Service: Orthopedics;  Laterality: Right;   QUADRICEPS TENDON REPAIR Right 04/25/2019   Procedure: REPAIR QUADRICEP TENDON;  Surgeon: Christena Flake, MD;  Location: [facility] ORS;  Service: Orthopedics;  Laterality: Right;   QUADRICEPS TENDON REPAIR Right 06/02/2019   Procedure: REPAIR QUADRICEP TENDON;  Surgeon: Christena Flake, MD;  Location: [facility] ORS;  Service: Orthopedics;  Laterality: Right;   Family History: History reviewed. No pertinent  family history. Family Psychiatric  History:  Social History:  Social History   Substance and Sexual Activity  Alcohol Use Not Currently     Social History   Substance and Sexual Activity  Drug Use Not Currently    Social History   Socioeconomic History   Marital status: Single    Spouse name: Not on file   Number of children: Not on file   Years of education: Not on file   Highest education level: Not on file  Occupational  History   Not on file  Tobacco Use   Smoking status: Never   Smokeless tobacco: Never  Vaping Use   Vaping Use: Never used  Substance and Sexual Activity   Alcohol use: Not Currently   Drug use: Not Currently   Sexual activity: Not Currently  Other Topics Concern   Not on file  Social History Narrative   Not on file   Social Determinants of Health   Financial Resource Strain: Not on file  Food Insecurity: No Food Insecurity (12/07/2022)   Hunger Vital Sign    Worried About Running Out of Food in the Last Year: Never true    Ran Out of Food in the Last Year: Never true  Transportation Needs: No Transportation Needs (12/07/2022)   PRAPARE - Administrator, Civil Service (Medical): No    Lack of Transportation (Non-Medical): No  Physical Activity: Not on file  Stress: Not on file  Social Connections: Not on file   Additional Social History:    Allergies:   Allergies  Allergen Reactions   Haloperidol Lactate Other (See Comments)    Per [facility]; nature of intolerance unclear intolerance Other reaction(s): Other (See Comments) Per [facility]; nature of intolerance unclear     Labs:  Results for orders placed or performed during the hospital encounter of 12/06/22 (from the past 48 hour(s))  Blood gas, arterial     Status: Abnormal   Collection Time: 12/10/22  3:00 AM  Result Value Ref Range   O2 Content 6.0 L/min   Delivery systems NASAL CANNULA    pH, Arterial 7.44 7.35 - 7.45   pCO2 arterial 44 32 - 48 mmHg   pO2, Arterial 82 (L) 83 - 108 mmHg   Bicarbonate 29.9 (H) 20.0 - 28.0 mmol/L   Acid-Base Excess 5.0 (H) 0.0 - 2.0 mmol/L   O2 Saturation 97.3 %   Patient temperature 37.0    Collection site RIGHT RADIAL    Drawn by Manson Passey AND CLOUDY    Allens test (pass/fail) PASS PASS    Comment: Performed at [facility], [address] [place], [place] [address]  Basic metabolic panel     Status: Abnormal   Collection Time: 12/10/22  3:36 AM  Result Value  Ref Range   Sodium 137 135 - 145 mmol/L   Potassium 4.8 3.5 - 5.1 mmol/L    Comment: HEMOLYSIS AT THIS LEVEL MAY AFFECT RESULT   Chloride 102 98 - 111 mmol/L   CO2 25 22 - 32 mmol/L   Glucose, Bld 112 (H) 70 - 99 mg/dL    Comment: Glucose reference range applies only to samples taken after fasting for at least 8 hours.   BUN 21 (H) 6 - 20 mg/dL   Creatinine, Ser 6.04 0.61 - 1.24 mg/dL   Calcium 8.8 (L) 8.9 - 10.3 mg/dL   GFR, Estimated >54 >09 mL/min    Comment: (NOTE) Calculated using the CKD-EPI Creatinine Equation (2021)  Anion gap 10 5 - 15    Comment: Performed at [facility], [address] [place], [place] [address]  Lactic acid, plasma     Status: None   Collection Time: 12/10/22  3:36 AM  Result Value Ref Range   Lactic Acid, Venous 0.9 0.5 - 1.9 mmol/L    Comment: Performed at [facility], [address] [place], [place] [address]  Procalcitonin - Baseline     Status: None   Collection Time: 12/10/22  3:36 AM  Result Value Ref Range   Procalcitonin <0.10 ng/mL    Comment:        Interpretation: PCT (Procalcitonin) <= 0.5 ng/mL: Systemic infection (sepsis) is not likely. Local bacterial infection is possible. (NOTE)       Sepsis PCT Algorithm           Lower Respiratory Tract                                      Infection PCT Algorithm    ----------------------------     ----------------------------         PCT < 0.25 ng/mL                PCT < 0.10 ng/mL          Strongly encourage             Strongly discourage   discontinuation of antibiotics    initiation of antibiotics    ----------------------------     -----------------------------       PCT 0.25 - 0.50 ng/mL            PCT 0.10 - 0.25 ng/mL               OR       >80% decrease in PCT            Discourage initiation of                                            antibiotics      Encourage discontinuation           of antibiotics    ----------------------------      -----------------------------         PCT >= 0.50 ng/mL              PCT 0.26 - 0.50 ng/mL               AND        <80% decrease in PCT             Encourage initiation of                                             antibiotics       Encourage continuation           of antibiotics    ----------------------------     -----------------------------        PCT >= 0.50 ng/mL                  PCT > 0.50 ng/mL  AND         increase in PCT                  Strongly encourage                                      initiation of antibiotics    Strongly encourage escalation           of antibiotics                                     -----------------------------                                           PCT <= 0.25 ng/mL                                                 OR                                        > 80% decrease in PCT                                      Discontinue / Do not initiate                                             antibiotics  Performed at [facility], [address] [place], [place] [address]   Glucose, capillary     Status: Abnormal   Collection Time: 12/10/22  3:50 AM  Result Value Ref Range   Glucose-Capillary 131 (H) 70 - 99 mg/dL    Comment: Glucose reference range applies only to samples taken after fasting for at least 8 hours.  CBC     Status: Abnormal   Collection Time: 12/10/22  4:06 AM  Result Value Ref Range   WBC 4.6 4.0 - 10.5 K/uL   RBC 4.25 4.22 - 5.81 MIL/uL   Hemoglobin 13.8 13.0 - 17.0 g/dL   HCT 60.4 54.0 - 98.1 %   MCV 96.2 80.0 - 100.0 fL   MCH 32.5 26.0 - 34.0 pg   MCHC 33.7 30.0 - 36.0 g/dL   RDW 19.1 47.8 - 29.5 %   Platelets 108 (L) 150 - 400 K/uL   nRBC 0.4 (H) 0.0 - 0.2 %    Comment: Performed at [facility], [address] [place], [place] [address]  Culture, blood (Routine X 2) w Reflex to ID Panel     Status: None (Preliminary result)   Collection Time: 12/10/22  4:09 AM   Specimen: BLOOD   Result Value Ref Range   Specimen Description BLOOD BLOOD LEFT HAND    Special Requests      BOTTLES DRAWN AEROBIC AND ANAEROBIC Blood Culture adequate volume  Culture      NO GROWTH 1 DAY Performed at [facility], [address] [place], [place] [address]    Report Status PENDING   Culture, blood (Routine X 2) w Reflex to ID Panel     Status: None (Preliminary result)   Collection Time: 12/10/22  4:09 AM   Specimen: BLOOD  Result Value Ref Range   Specimen Description BLOOD BLOOD LEFT ARM    Special Requests      BOTTLES DRAWN AEROBIC AND ANAEROBIC Blood Culture adequate volume   Culture      NO GROWTH 1 DAY Performed at [facility], [address], [place], [place] [address]    Report Status PENDING   Resp panel by RT-PCR (RSV, Flu A&B, Covid) Anterior Nasal Swab     Status: None   Collection Time: 12/10/22  4:32 AM   Specimen: Anterior Nasal Swab  Result Value Ref Range   SARS Coronavirus 2 by RT PCR NEGATIVE NEGATIVE    Comment: (NOTE) SARS-CoV-2 target nucleic acids are NOT DETECTED.  The SARS-CoV-2 RNA is generally detectable in upper respiratory specimens during the acute phase of infection. The lowest concentration of SARS-CoV-2 viral copies this assay can detect is 138 copies/mL. A negative result does not preclude SARS-Cov-2 infection and should not be used as the sole basis for treatment or other patient management decisions. A negative result may occur with  improper specimen collection/handling, submission of specimen other than nasopharyngeal swab, presence of viral mutation(s) within the areas targeted by this assay, and inadequate number of viral copies(<138 copies/mL). A negative result must be combined with clinical observations, patient history, and epidemiological information. The expected result is Negative.  Fact Sheet for Patients:  BloggerCourse.com  Fact Sheet for Healthcare Providers:   SeriousBroker.it  This test is no t yet approved or cleared by the [place] FDA and  has been authorized for detection and/or diagnosis of SARS-CoV-2 by FDA under an Emergency Use Authorization (EUA). This EUA will remain  in effect (meaning this test can be used) for the duration of the COVID-19 declaration under Section 564(b)(1) of the Act, 21 U.S.C.section 360bbb-3(b)(1), unless the authorization is terminated  or revoked sooner.       Influenza A by PCR NEGATIVE NEGATIVE   Influenza B by PCR NEGATIVE NEGATIVE    Comment: (NOTE) The Xpert Xpress SARS-CoV-2/FLU/RSV plus assay is intended as an aid in the diagnosis of influenza from Nasopharyngeal swab specimens and should not be used as a sole basis for treatment. Nasal washings and aspirates are unacceptable for Xpert Xpress SARS-CoV-2/FLU/RSV testing.  Fact Sheet for Patients: BloggerCourse.com  Fact Sheet for Healthcare Providers: SeriousBroker.it  This test is not yet approved or cleared by the [place] FDA and has been authorized for detection and/or diagnosis of SARS-CoV-2 by FDA under an Emergency Use Authorization (EUA). This EUA will remain in effect (meaning this test can be used) for the duration of the COVID-19 declaration under Section 564(b)(1) of the Act, 21 U.S.C. section 360bbb-3(b)(1), unless the authorization is terminated or revoked.     Resp Syncytial Virus by PCR NEGATIVE NEGATIVE    Comment: (NOTE) Fact Sheet for Patients: BloggerCourse.com  Fact Sheet for Healthcare Providers: SeriousBroker.it  This test is not yet approved or cleared by the [place] FDA and has been authorized for detection and/or diagnosis of SARS-CoV-2 by FDA under an Emergency Use Authorization (EUA). This EUA will remain in effect (meaning this test can be used) for  the duration of  the COVID-19 declaration under Section 564(b)(1) of the Act, 21 U.S.C. section 360bbb-3(b)(1), unless the authorization is terminated or revoked.  Performed at [facility], [address] [place], [place] [address]   MRSA Next Gen by PCR, Nasal     Status: None   Collection Time: 12/10/22  4:33 AM   Specimen: Nasal Mucosa; Nasal Swab  Result Value Ref Range   MRSA by PCR Next Gen NOT DETECTED NOT DETECTED    Comment: (NOTE) The GeneXpert MRSA Assay (FDA approved for NASAL specimens only), is one component of a comprehensive MRSA colonization surveillance program. It is not intended to diagnose MRSA infection nor to guide or monitor treatment for MRSA infections. Test performance is not FDA approved in patients less than 85 years old. Performed at [facility], [address] [place], [place] [address]   D-dimer, quantitative     Status: Abnormal   Collection Time: 12/10/22  4:50 AM  Result Value Ref Range   D-Dimer, Quant 0.65 (H) 0.00 - 0.50 ug/mL-FEU    Comment: (NOTE) At the manufacturer cut-off value of 0.5 g/mL FEU, this assay has a negative predictive value of 95-100%.This assay is intended for use in conjunction with a clinical pretest probability (PTP) assessment model to exclude pulmonary embolism (PE) and deep venous thrombosis (DVT) in outpatients suspected of PE or DVT. Results should be correlated with clinical presentation. Performed at [facility], [address] [place], [place] [address]   CK     Status: None   Collection Time: 12/10/22  4:50 AM  Result Value Ref Range   Total CK 103 49 - 397 U/L    Comment: Performed at [facility], [address] [place], [place] [address]  Basic metabolic panel     Status: Abnormal   Collection Time: 12/11/22  3:30 AM  Result Value Ref Range   Sodium 140 135 - 145 mmol/L   Potassium 4.1 3.5 - 5.1 mmol/L   Chloride 103 98 - 111 mmol/L   CO2 26 22 - 32 mmol/L   Glucose,  Bld 94 70 - 99 mg/dL    Comment: Glucose reference range applies only to samples taken after fasting for at least 8 hours.   BUN 23 (H) 6 - 20 mg/dL   Creatinine, Ser 3.14 0.61 - 1.24 mg/dL   Calcium 8.7 (L) 8.9 - 10.3 mg/dL   GFR, Estimated >97 >02 mL/min    Comment: (NOTE) Calculated using the CKD-EPI Creatinine Equation (2021)    Anion gap 11 5 - 15    Comment: Performed at [facility], [address] [place], [place] [address]  Magnesium     Status: None   Collection Time: 12/11/22  3:30 AM  Result Value Ref Range   Magnesium 2.4 1.7 - 2.4 mg/dL    Comment: Performed at [facility], [address] [place], [place] [address]  CBC     Status: Abnormal   Collection Time: 12/11/22  3:30 AM  Result Value Ref Range   WBC 4.7 4.0 - 10.5 K/uL   RBC 3.95 (L) 4.22 - 5.81 MIL/uL   Hemoglobin 12.9 (L) 13.0 - 17.0 g/dL   HCT 77.4 (L) 12.8 - 78.6 %   MCV 96.2 80.0 - 100.0 fL   MCH 32.7 26.0 - 34.0 pg   MCHC 33.9 30.0 - 36.0 g/dL   RDW 76.7 20.9 - 47.0 %   Platelets 100 (L) 150 - 400 K/uL  nRBC 0.0 0.0 - 0.2 %    Comment: Performed at [facility], [address] [place], [place] [address]    Current Facility-Administered Medications  Medication Dose Route Frequency Provider Last Rate Last Admin   0.9 %  sodium chloride infusion   Intravenous PRN Marrion CoyZhang, Dekui, MD   Stopped at 12/10/22 1541   acetaminophen (TYLENOL) tablet 650 mg  650 mg Oral BID Agbata, Tochukwu, MD   650 mg at 12/10/22 0955   Ampicillin-Sulbactam (UNASYN) 3 g in sodium chloride 0.9 % 100 mL IVPB  3 g Intravenous Q8H Marrion CoyZhang, Dekui, MD       ascorbic acid (VITAMIN C) tablet 250 mg  250 mg Oral Daily Agbata, Tochukwu, MD   250 mg at 12/10/22 0954   benztropine (COGENTIN) tablet 0.5 mg  0.5 mg Oral BH-q7a Agbata, Tochukwu, MD   0.5 mg at 12/10/22 0900   benztropine (COGENTIN) tablet 1 mg  1 mg Oral BID Agbata, Tochukwu, MD   1 mg at 12/09/22 2058   budesonide (PULMICORT) nebulizer solution  0.5 mg  0.5 mg Nebulization BID Ezequiel EssexNelson, Dana G, NP   0.5 mg at 12/11/22 0730   Chlorhexidine Gluconate Cloth 2 % PADS 6 each  6 each Topical Daily Marrion CoyZhang, Dekui, MD   6 each at 12/10/22 0955   cholecalciferol (VITAMIN D3) 25 MCG (1000 UNIT) tablet 2,000 Units  2,000 Units Oral Daily Agbata, Tochukwu, MD   2,000 Units at 12/10/22 0955   divalproex (DEPAKOTE) DR tablet 1,000 mg  1,000 mg Oral BID Agbata, Tochukwu, MD   1,000 mg at 12/10/22 0846   ipratropium-albuterol (DUONEB) 0.5-2.5 (3) MG/3ML nebulizer solution 3 mL  3 mL Nebulization Q4H PRN Marrion CoyZhang, Dekui, MD       levothyroxine (SYNTHROID) tablet 25 mcg  25 mcg Oral Q0600 Arnetha CourserAmin, Sumayya, MD   25 mcg at 12/09/22 0602   LORazepam (ATIVAN) injection 1 mg  1 mg Intramuscular Q6H PRN Marrion CoyZhang, Dekui, MD       OLANZapine (ZYPREXA) tablet 5 mg  5 mg Oral BID Agbata, Tochukwu, MD   5 mg at 12/10/22 0847   ondansetron (ZOFRAN) tablet 4 mg  4 mg Oral Q6H PRN Agbata, Tochukwu, MD       Or   ondansetron (ZOFRAN) injection 4 mg  4 mg Intravenous Q6H PRN Agbata, Tochukwu, MD       paliperidone (INVEGA) 24 hr tablet 6 mg  6 mg Oral Daily Agbata, Tochukwu, MD   6 mg at 12/10/22 0955   paliperidone (INVEGA) 24 hr tablet 9 mg  9 mg Oral QHS Agbata, Tochukwu, MD   9 mg at 12/09/22 2102   propranolol (INDERAL) tablet 20 mg  20 mg Oral BID Agbata, Tochukwu, MD   20 mg at 12/10/22 0845   sertraline (ZOLOFT) tablet 100 mg  100 mg Oral BID Agbata, Tochukwu, MD   100 mg at 12/10/22 0845   thiamine (VITAMIN B1) tablet 100 mg  100 mg Oral Daily Agbata, Tochukwu, MD   100 mg at 12/10/22 0955   traMADol (ULTRAM) tablet 50 mg  50 mg Oral Q8H PRN Agbata, Tochukwu, MD       vitamin B-12 (CYANOCOBALAMIN) tablet 100 mcg  100 mcg Oral Daily Agbata, Tochukwu, MD   100 mcg at 12/10/22 [id number]    Musculoskeletal: Strength & Muscle Tone:  Did not assess Gait & Station:  Did not assess Patient leans: N/A   Psychiatric Specialty Exam:   Presentation  General Appearance: No data  recorded Eye Contact:No data recorded Speech:No data recorded Speech Volume:No data recorded Handedness:No data recorded  Mood and Affect  Mood:No data recorded Affect:No data recorded  Thought Process  Thought Processes:No data recorded Descriptions of Associations:No data recorded Orientation:No data recorded Thought Content:No data recorded History of Schizophrenia/Schizoaffective disorder:No data recorded Duration of Psychotic Symptoms:No data recorded Hallucinations:No data recorded Ideas of Reference:No data recorded Suicidal Thoughts:No data recorded Homicidal Thoughts:No data recorded  Sensorium  Memory:No data recorded Judgment:No data recorded Insight:No data recorded  Executive Functions  Concentration:No data recorded Attention Span:No data recorded Recall:No data recorded Fund of Knowledge:No data recorded Language:No data recorded  Psychomotor Activity  Psychomotor Activity:No data recorded  Assets  Assets:No data recorded  Sleep  Sleep:No data recorded  Physical Exam: Physical Exam Vitals reviewed.  HENT:     Head: Normocephalic.     Nose: No congestion or rhinorrhea.  Eyes:     General:        Right eye: No discharge.        Left eye: No discharge.  Cardiovascular:     Rate and Rhythm: Normal rate.  Pulmonary:     Effort: Pulmonary effort is normal.  Musculoskeletal:     Cervical back: Normal range of motion.  Skin:    General: Skin is dry.  Neurological:     Mental Status: He is alert and oriented to person, place, and time.  Psychiatric:        Attention and Perception: Attention normal.        Mood and Affect: Mood is anxious.        Speech: Speech normal.        Behavior: Behavior is cooperative.        Thought Content: Thought content is not delusional. Thought content does not include homicidal or suicidal ideation.        Cognition and Memory: Cognition is impaired.     Comments: Schizophrenia, borderline intellectual  functioning    Review of Systems  HENT: Negative.    Eyes: Negative.   Respiratory: Negative.    Psychiatric/Behavioral:  Negative for suicidal ideas. The patient is nervous/anxious.    Blood pressure (!) 106/51, pulse 81, temperature 98 F (36.7 C), resp. rate 18, height 5\' 10"  (1.778 m), weight 104.3 kg, SpO2 93 %. Body mass index is 32.99 kg/m.  Treatment Plan Summary: Plan encourage staff to use as needed Ativan that is prescribed for anxiety.  Reviewed with patient's bedside nurse and Dr. per chart review.   Disposition: No evidence of imminent risk to self or others at present.   Patient does not meet criteria for psychiatric inpatient admission. Supportive therapy provided about ongoing stressors.  Chipper Herb, NP 12/11/2022 4:59 PM

## 2022-12-11 NOTE — TOC PASRR Note (Signed)
RE: Ethan Alvarez. [place] Date of Birth: 22-Oct-1961 Date: 12/11/2022     To Whom It May Concern:   Please be advised that the above-named patient will require a short-term nursing home stay - anticipated 30 days or less for rehabilitation and strengthening.  The plan is for return home

## 2022-12-11 NOTE — Care Management Important Message (Signed)
Important Message  Patient Details  Name: Ethan Alvarez MRN: [id number] Date of Birth: [birth year]   Medicare Important Message Given:  Yes  Reviewed Medicare IM with Velva Harman, legal guardian, at [phone].  Copy of Medicare IM left in room with patient's belongings for reference.     Johnell Comings 12/11/2022, 2:34 PM

## 2022-12-11 NOTE — Evaluation (Signed)
Clinical/Bedside Swallow Evaluation Patient Details  Name: Ethan Alvarez MRN: [id number] Date of Birth: [birth year]  Today's Date: 12/11/2022 Time: SLP Start Time (ACUTE ONLY): 1155 SLP Stop Time (ACUTE ONLY): 1240 SLP Time Calculation (min) (ACUTE ONLY): 45 min  Past Medical History:  Past Medical History:  Diagnosis Date   Hypertension    Schizophrenia (HCC)    Past Surgical History:  Past Surgical History:  Procedure Laterality Date   INCISION AND DRAINAGE Right 06/02/2019   Procedure: INCISION AND DRAINAGE;  Surgeon: Christena Flake, MD;  Location: [facility] ORS;  Service: Orthopedics;  Laterality: Right;   QUADRICEPS TENDON REPAIR Right 04/25/2019   Procedure: REPAIR QUADRICEP TENDON;  Surgeon: Christena Flake, MD;  Location: [facility] ORS;  Service: Orthopedics;  Laterality: Right;   QUADRICEPS TENDON REPAIR Right 06/02/2019   Procedure: REPAIR QUADRICEP TENDON;  Surgeon: Christena Flake, MD;  Location: [facility] ORS;  Service: Orthopedics;  Laterality: Right;   HPI:  Pt is a 61 y.o. male with medical history significant for schizophrenia, hypertension, obesity who presents to the emergency room via EMS for evaluation of several falls over the last couple of days and inability to ambulate.  Patient was seen in the ER, 2 days prior to this admission after he fell and stubbed his toe and was noted to have some redness and swelling over his right foot.  He had a right foot x-ray which showed a closed nondisplaced fracture of the proximal phalanx of the right great toe and was discharged home on a cam boot.  Patient's brother states that since his discharge home he has had difficulty getting around and has had several falls.  PT has recommended SNF at D/C.   CXR: Left lung base density, likely atelectasis.  Per chart note, subacute decline in his cognition over the past several months and also in his ambulating since his injury. Frequent falls.  Family is working on getting him into a long-term facility to care  for after him.    Assessment / Plan / Recommendation  Clinical Impression   Pt seen for BSE today. Pt was awake, verbal and followed basic directions. He requested OJ which was provided during the BSE. Pt did not readily give much informtion about himself, seemed mildly distracted, but stated he liked playing basketball and wanted to know how much longer he was going to be "locked up in here". He was redirected to the fact that he was in the hospital -- he repeated that stating understanding then asked if he could "rest" w/ no TV on.   Per MD note: per family -- "subacute decline in his cognition over the past several months and also in his ambulating since his injury. Frequent falls.  Family is working on getting him into a long-term facility to care for after him.".  Pt appears to present w/ functional oropharyngeal phase swallowing w/ No oropharyngeal phase dysphagia noted, No neuromuscular deficits noted. Pt consumed po trials w/ No immediate, overt, clinical s/s of aspiration during po trials. Pt appears at reduced risk for aspiration following general aspiration precautions.   However, pt does have challenging factors that could impact oropharyngeal swallowing to include mild UE tremors during self-feeding(meds?), Psychiatric illness/distraction/agitation, weakness, and Edentulous status at baseline. These factors can increase risk for dysphagia, aspiration as well as decreased oral intake overall.   During po trials, pt consumed all consistencies w/ no overt coughing, decline in vocal quality, or change in respiratory presentation during/post trials. O2 sats 98%. Oral  phase appeared grossly [facility] w/ timely bolus management, mastication, and control of bolus propulsion for A-P transfer for swallowing w/ moistened, small food trials; thin liquids were appropriate. Oral clearing achieved w/ all trial consistencies -- moistened, soft foods given d/t Edentulous status.  OM Exam appeared [facility] w/ no  unilateral weakness noted. Speech Clear. Pt fed self w/ setup support mostly holding cup to drink, needed more support w/ foods -- suspect finger foods could be helpful right now.   Recommend a more Mech soft diet consistency w/ well-Cut meats, moistened foods d/t Edentulous status; Thin liquids. Support at meals w/ feeding as needed d/t UE tremors -- pt to hold cup to drink. Recommend general aspiration precautions, reduce distractions during meals to allow pt to focus on the meal. Pills WHOLE in Puree for safer, easier swallowing as needed.   Education given on Pills in Puree; food consistencies and easy to eat options d/t Edentulous status; general aspiration precautions to pt. NSG to reconsult if any new needs arise. NSG updated, agreed. MD updated. Recommend Dietician f/u for support. SLP Visit Diagnosis: Dysphagia, unspecified (R13.10) (Edentulous; Psychiatric illness)    Aspiration Risk   (reduced following general precautions)    Diet Recommendation   a more Mech soft diet consistency w/ well-Cut meats, moistened foods d/t Edentulous status; Thin liquids. Support at meals w/ feeding as needed d/t UE tremors -- pt to hold cup to drink. Recommend general aspiration precautions, reduce distractions during meals to allow pt to focus on the meal.  Medication Administration: Whole meds with puree (as needed for safer, easier swallowing)    Other  Recommendations Recommended Consults:  (Dietician f/u) Oral Care Recommendations: Oral care BID;Oral care before and after PO;Staff/trained caregiver to provide oral care (support) Other Recommendations:  (n/a)    Recommendations for follow up therapy are one component of a multi-disciplinary discharge planning process, led by the attending physician.  Recommendations may be updated based on patient status, additional functional criteria and insurance authorization.  Follow up Recommendations No SLP follow up      Assistance Recommended at  Discharge  PRN support at meals  Functional Status Assessment Patient has not had a recent decline in their functional status  Frequency and Duration  (n/a)   (n/a)       Prognosis Prognosis for Safe Diet Advancement: Fair Barriers to Reach Goals: Cognitive deficits;Time post onset;Severity of deficits;Behavior (Edentulous; Psychiatric illness) Barriers/Prognosis Comment: schizophrenia      Swallow Study   General Date of Onset: 12/04/22 HPI: Pt is a 61 y.o. male with medical history significant for schizophrenia, hypertension, obesity who presents to the emergency room via EMS for evaluation of several falls over the last couple of days and inability to ambulate.  Patient was seen in the ER, 2 days prior to this admission after he fell and stubbed his toe and was noted to have some redness and swelling over his right foot.  He had a right foot x-ray which showed a closed nondisplaced fracture of the proximal phalanx of the right great toe and was discharged home on a cam boot.  Patient's brother states that since his discharge home he has had difficulty getting around and has had several falls.  PT has recommended SNF at D/C.   CXR: Left lung base density, likely atelectasis.  Per chart note, subacute decline in his cognition over the past several months and also in his ambulating since his injury. Frequent falls.  Family is working on getting him  into a long-term facility to care for after him. Type of Study: Bedside Swallow Evaluation Previous Swallow Assessment: BSE in 2022 - mech soft diet then, thins Diet Prior to this Study: NPO Temperature Spikes Noted: No (wbc wnl) Respiratory Status: Nasal cannula (2L) History of Recent Intubation: No Behavior/Cognition: Alert;Cooperative;Pleasant mood;Confused;Distractible;Requires cueing Oral Cavity Assessment: Within Functional Limits Oral Care Completed by SLP: Yes Oral Cavity - Dentition: Edentulous Vision: Functional for  self-feeding Self-Feeding Abilities: Able to feed self;Needs set up;Needs assist (support) Patient Positioning: Upright in bed (needed min positioning support) Baseline Vocal Quality: Normal Volitional Cough: Strong Volitional Swallow: Able to elicit    Oral/Motor/Sensory Function Overall Oral Motor/Sensory Function: Within functional limits   Ice Chips Ice chips: Within functional limits Presentation: Spoon (fed; 2 trials)   Thin Liquid Thin Liquid: Within functional limits Presentation: Self Fed;Straw (~8 ozs) Other Comments: water, then OJ    Nectar Thick Nectar Thick Liquid: Not tested   Honey Thick Honey Thick Liquid: Not tested   Puree Puree: Within functional limits Presentation: Self Fed;Spoon (6 trial) Other Comments: then stopped   Solid     Solid: Within functional limits (though Edentulous) Presentation: Spoon (fed; 4 trials) Other Comments: then declined further        Jerilynn Som, MS, CCC-SLP Speech Language Pathologist Rehab Services; [facility] [phone] (ascom) Emmalise Huard 12/11/2022,1:19 PM

## 2022-12-11 NOTE — Progress Notes (Signed)
Progress Note   Patient: Ethan Alvarez NWG:[id number] DOB: 17-Jun-1961 DOA: 12/06/2022     5 DOS: the patient was seen and examined on 12/11/2022   Brief hospital course:  Ethan Alvarez is a 61 y.o. male with medical history significant for schizophrenia, hypertension, obesity who presents to the emergency room via EMS for evaluation of several falls over the last couple of days and inability to ambulate. Patient was seen in the ER, 2 days prior to this admission after he fell and stubbed his toe and was noted to have some redness and swelling over his right foot.  He had a right foot x-ray which showed a closed nondisplaced fracture of the proximal phalanx of the right great toe and was discharged home on a cam boot and oral antibiotics for cellulitis. Patient's brother states that since his discharge home he has had difficulty getting around and has had several falls.   Patient is seen by podiatry, recommended 7 days of doxycycline.  However, patient has significant weakness, PT has recommended SNF placement. Patient spiked high fever at the night of 12/24.  Started Zosyn for aspiration pneumonia.   Assessment and Plan: Sepsis secondary to aspiration pneumonia. Not POA Aspiration pneumonia of bilateral lower lobes. Not POA Acute respiratory failure with hypoxemia secondary to aspiration pneumonia. Not POA Acute metabolic encephalopathy secondary to sepsis. Patient is spiked high fever of 102 on 12/24, he also had significant respiratory distress with a respiratory rate of 44, heart rate 100, met sepsis criteria.  He also had an increased oxygen requirement up to 6 L.  He also was unresponsive and transferred to intensive care unit. Patient was treated with Zosyn. His x-ray had bilateral lower lobe infiltrates. Oxygenation much improved today, mental status has improved.  Antibiotic changed to Unasyn, this is day 3 of 5-day dose. Patient had a significant agitation and sundowning in ICU  yesterday evening, was given IV Ativan as needed.  Mental status seems to be improved today.  I have requested psychiatry consult to see if we need to adjust his psych medicines.   Cellulitis and abscess of foot Acute fracture of right first proximal phalanx  Frequent falls Patient has no evidence of osteomyelitis.  Doxycycline for 7 days. Patient has been eval by physical therapy, recommended nursing home placement. Continue Unasyn for now.     Thrombocytopenia (HCC) Stable, continue to follow   Schizophrenia ([place]) Borderline intellectual functioning Continue home medicines   Obesity (BMI 30.0-34.9) Estimated body mass index is 32.99 kg/m as calculated from the following:   Height as of this encounter: _0  (1.778 m).   Weight as of this encounter: 104.3 kg.         Subjective:  Doing better today, no additional confusion today, on 2 L oxygen without shortness of breath.  Physical Exam: Vitals:   12/11/22 0251 12/11/22 0416 12/11/22 0731 12/11/22 0747  BP:  (!) 100/56  (!) 106/51  Pulse:  75 70 81  Resp:  _1 Temp:  97.8 F (36.6 C)  98 F (36.7 C)  TempSrc:  Oral    SpO2: 94% 94% 93% 93%  Weight:      Height:       General exam: Appears calm and comfortable  Respiratory system: Clear to auscultation. Respiratory effort normal. Cardiovascular system: S1 & S2 heard, RRR. No JVD, murmurs, rubs, gallops or clicks. No pedal edema. Gastrointestinal system: Abdomen is nondistended, soft and nontender. No organomegaly or masses felt. Normal  bowel sounds heard. Central nervous system: Alert and oriented x3. No focal neurological deficits. Extremities: Symmetric 5 x 5 power. Skin: No rashes, lesions or ulcers Psychiatry: Judgement and insight appear normal. Mood & affect appropriate.   Data Reviewed:  Lab results reviewed.  Family Communication: None  Disposition: Status is: Inpatient Remains inpatient appropriate because: Severity of disease, unsafe  discharge pending nursing placement.  Planned Discharge Destination: Skilled nursing facility    Time spent: 50 minutes  Author: Sharen Hones, MD 12/11/2022 10:07 AM  For on call review [email or web address].

## 2022-12-11 NOTE — NC FL2 (Signed)
[place] LEVEL OF CARE FORM     IDENTIFICATION  Patient Name: Ethan Alvarez Birthdate: [birth year] Sex: male Admission Date (Current Location): 12/06/2022  [facility] and [place] Number:  Engineering geologist and Address:  [facility], [address] [place],  [address]      Provider Number: [id number]  Attending Physician Name and Address:  Sharen Hones, MD  Relative Name and Phone Number:  THERIN, PELC (Brother) [phone]    Current Level of Care: Hospital Recommended Level of Care: [place] Prior Approval Number:    Date Approved/Denied:   PASRR Number: Pending  Discharge Plan: SNF    Current Diagnoses: Patient Active Problem List   Diagnosis Date Noted   Acute hypoxic respiratory failure ([place]) 0000000   Toxic metabolic encephalopathy 0000000   Aspiration pneumonia of both lower lobes due to regurgitated food ([place]) 12/10/2022   Closed nondisplaced fracture of proximal phalanx of right great toe 12/07/2022   Frequent falls 12/06/2022   Cellulitis and abscess of foot 12/06/2022   Thrombocytopenia ([place]) 04/24/2021   Anxiety 04/24/2021   OCD (obsessive compulsive disorder) 04/24/2021   Rosacea 04/24/2021   Dehydration    Altered mental status    Sepsis ([place])    COVID-19 virus infection 12/30/2020   Generalized weakness    Schizophrenia ([place])    Obesity (BMI 30.0-34.9) 04/23/2020   Aggressive behavior of adult 09/25/2019   Suicidal ideation 09/25/2019   Fall 09/14/2019   MSSA bacteremia 07/09/2019   Septic arthritis ([place]) 07/09/2019   Cellulitis of right knee 06/01/2019   Rupture of right quadriceps tendon 04/25/2019   Tremor 02/12/2019   Abnormal TSH 02/11/2018   Vitamin D deficiency 02/11/2018   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   Essential hypertension 03/14/2017   Pancreatitis 03/14/2017   Heart palpitations 02/26/2017   SOB (shortness of breath) 02/26/2017   High risk  medication use 02/13/2016   History of adenomatous polyp of colon 10/07/2014   Hyperprolactinemia ([place]) 10/07/2014    Orientation RESPIRATION BLADDER Height & Weight     Self, Place  O2 (2l) External catheter Weight: 229 lb 15 oz (104.3 kg) Height:  5\' 10"  (177.8 cm)  BEHAVIORAL SYMPTOMS/MOOD NEUROLOGICAL BOWEL NUTRITION STATUS      Incontinent  (Dysphagia 3)  AMBULATORY STATUS COMMUNICATION OF NEEDS Skin   Extensive Assist Verbally Skin abrasions (abrasion to Right knee, sacral dressing, nonpressure wound to anterior right toe.)                       Personal Care Assistance Level of Assistance  Bathing, Feeding, Dressing Bathing Assistance: Maximum assistance Feeding assistance: Maximum assistance Dressing Assistance: Maximum assistance     Functional Limitations Info  Sight, Hearing, Speech Sight Info: Adequate Hearing Info: Adequate Speech Info: Adequate    SPECIAL CARE FACTORS FREQUENCY  PT (By licensed PT), OT (By licensed OT)     PT Frequency: 5 times a week OT Frequency: 5 times a week            Contractures Contractures Info: Not present    Additional Factors Info  Code Status, Allergies Code Status Info: FULL Allergies Info: Haloperidol Lactate           Current Medications (12/11/2022):  This is the current hospital active medication list Current Facility-Administered Medications  Medication Dose Route Frequency Provider Last Rate Last Admin   0.9 %  sodium chloride infusion   Intravenous PRN Sharen Hones, MD  Stopped at 12/10/22 1541   acetaminophen (TYLENOL) tablet 650 mg  650 mg Oral BID Agbata, Tochukwu, MD   650 mg at 12/10/22 0955   Ampicillin-Sulbactam (UNASYN) 3 g in sodium chloride 0.9 % 100 mL IVPB  3 g Intravenous Q8H Marrion Coy, MD       ascorbic acid (VITAMIN C) tablet 250 mg  250 mg Oral Daily Agbata, Tochukwu, MD   250 mg at 12/10/22 0954   benztropine (COGENTIN) tablet 0.5 mg  0.5 mg Oral BH-q7a Agbata, Tochukwu, MD   0.5  mg at 12/10/22 0900   benztropine (COGENTIN) tablet 1 mg  1 mg Oral BID Agbata, Tochukwu, MD   1 mg at 12/09/22 2058   budesonide (PULMICORT) nebulizer solution 0.5 mg  0.5 mg Nebulization BID Ezequiel Essex, NP   0.5 mg at 12/11/22 0730   Chlorhexidine Gluconate Cloth 2 % PADS 6 each  6 each Topical Daily Marrion Coy, MD   6 each at 12/10/22 0955   cholecalciferol (VITAMIN D3) 25 MCG (1000 UNIT) tablet 2,000 Units  2,000 Units Oral Daily Agbata, Tochukwu, MD   2,000 Units at 12/10/22 0955   divalproex (DEPAKOTE) DR tablet 1,000 mg  1,000 mg Oral BID Agbata, Tochukwu, MD   1,000 mg at 12/10/22 0846   ipratropium-albuterol (DUONEB) 0.5-2.5 (3) MG/3ML nebulizer solution 3 mL  3 mL Nebulization Q4H PRN Marrion Coy, MD       levothyroxine (SYNTHROID) tablet 25 mcg  25 mcg Oral Q0600 Arnetha Courser, MD   25 mcg at 12/09/22 0602   LORazepam (ATIVAN) injection 1 mg  1 mg Intramuscular Q6H PRN Marrion Coy, MD       OLANZapine (ZYPREXA) tablet 5 mg  5 mg Oral BID Agbata, Tochukwu, MD   5 mg at 12/10/22 0847   ondansetron (ZOFRAN) tablet 4 mg  4 mg Oral Q6H PRN Agbata, Tochukwu, MD       Or   ondansetron (ZOFRAN) injection 4 mg  4 mg Intravenous Q6H PRN Agbata, Tochukwu, MD       paliperidone (INVEGA) 24 hr tablet 6 mg  6 mg Oral Daily Agbata, Tochukwu, MD   6 mg at 12/10/22 0955   paliperidone (INVEGA) 24 hr tablet 9 mg  9 mg Oral QHS Agbata, Tochukwu, MD   9 mg at 12/09/22 2102   propranolol (INDERAL) tablet 20 mg  20 mg Oral BID Agbata, Tochukwu, MD   20 mg at 12/10/22 0845   sertraline (ZOLOFT) tablet 100 mg  100 mg Oral BID Agbata, Tochukwu, MD   100 mg at 12/10/22 0845   thiamine (VITAMIN B1) tablet 100 mg  100 mg Oral Daily Agbata, Tochukwu, MD   100 mg at 12/10/22 0955   traMADol (ULTRAM) tablet 50 mg  50 mg Oral Q8H PRN Agbata, Tochukwu, MD       vitamin B-12 (CYANOCOBALAMIN) tablet 100 mcg  100 mcg Oral Daily Agbata, Tochukwu, MD   100 mcg at 12/10/22 2035     Discharge Medications: Please  see discharge summary for a list of discharge medications.  Relevant Imaging Results:  Relevant Lab Results:   Additional Information SS # [id number]  Allena Katz, LCSW

## 2022-12-12 DIAGNOSIS — L02619 Cutaneous abscess of unspecified foot: Secondary | ICD-10-CM | POA: Diagnosis not present

## 2022-12-12 DIAGNOSIS — L03119 Cellulitis of unspecified part of limb: Secondary | ICD-10-CM | POA: Diagnosis not present

## 2022-12-12 DIAGNOSIS — J9601 Acute respiratory failure with hypoxia: Secondary | ICD-10-CM | POA: Diagnosis not present

## 2022-12-12 DIAGNOSIS — J690 Pneumonitis due to inhalation of food and vomit: Secondary | ICD-10-CM | POA: Diagnosis not present

## 2022-12-12 MED ORDER — LACTULOSE 10 GM/15ML PO SOLN
20.0000 g | Freq: Once | ORAL | Status: AC
  Administered 2022-12-12: 20 g via ORAL
  Filled 2022-12-12: qty 30

## 2022-12-12 NOTE — TOC Progression Note (Signed)
Transition of Care [facility]) - Progression Note    Patient Details  Name: Ethan Alvarez MRN: [id number] Date of Birth: [birth year]  [facility]) CM/SW Contact  Chapman Fitch, RN Phone Number: 12/12/2022, 4:00 PM  Clinical Narrative:     Received call from sister [name].  Brother [name] has given me permission to update her. I have provided her with the bed offers.  She is to discuss with her brother [name].  They are aware that I will need one person for point of contact   Expected Discharge Plan: Skilled Nursing Facility Barriers to Discharge: Continued Medical Work up  Expected Discharge Plan and Services   Discharge Planning Services: CM Consult   Living arrangements for the past 2 months: Single Family Home                 DME Arranged: Walker rolling                     Social Determinants of Health (SDOH) Interventions SDOH Screenings   Food Insecurity: No Food Insecurity (12/07/2022)  Housing: Low Risk  (12/07/2022)  Transportation Needs: No Transportation Needs (12/07/2022)  Utilities: Not At Risk (12/07/2022)  Tobacco Use: Low Risk  (12/06/2022)    Readmission Risk Interventions     No data to display

## 2022-12-12 NOTE — Progress Notes (Signed)
Progress Note   Patient: Ethan Alvarez MCN:[id number] DOB: 08-Mar-1961 DOA: 12/06/2022     6 DOS: the patient was seen and examined on 12/12/2022   Brief hospital course:  Ethan Alvarez is a 61 y.o. male with medical history significant for schizophrenia, hypertension, obesity who presents to the emergency room via EMS for evaluation of several falls over the last couple of days and inability to ambulate. Patient was seen in the ER, 2 days prior to this admission after he fell and stubbed his toe and was noted to have some redness and swelling over his right foot.  He had a right foot x-ray which showed a closed nondisplaced fracture of the proximal phalanx of the right great toe and was discharged home on a cam boot and oral antibiotics for cellulitis. Patient's brother states that since his discharge home he has had difficulty getting around and has had several falls.   Patient is seen by podiatry, recommended 7 days of doxycycline.  However, patient has significant weakness, PT has recommended SNF placement. Patient spiked high fever at the night of 12/24.  Started Zosyn for aspiration pneumonia.  Changed to Unasyn 12/26.   Assessment and Plan:  Sepsis secondary to aspiration pneumonia. Not POA Aspiration pneumonia of bilateral lower lobes. Not POA Acute respiratory failure with hypoxemia secondary to aspiration pneumonia. Not POA Acute metabolic encephalopathy secondary to sepsis. Patient is spiked high fever of 102 on 12/24, he also had significant respiratory distress with a respiratory rate of 44, heart rate 100, met sepsis criteria.  He also had an increased oxygen requirement up to 6 L.  He also was unresponsive and transferred to intensive care unit. Patient was treated with Zosyn. His x-ray had bilateral lower lobe infiltrates. Oxygenation much improved today, mental status has improved, Antibiotic changed to Unasyn 12/26.  Continue Unasyn for now.  Will complete oral antibiotics  if discharged to nursing home before completing antibiotics.    Cellulitis and abscess of foot Acute fracture of right first proximal phalanx  Frequent falls Patient has no evidence of osteomyelitis.  Doxycycline for 7 days. Patient has been eval by physical therapy, recommended nursing home placement. Continue Unasyn for now.    Thrombocytopenia ([place]) Stable, continue to follow   Schizophrenia ([place]) Borderline intellectual functioning Patient has been seen by psychiatry, recommended continue as needed Ativan and continue home psych medicines.  Obesity (BMI 30.0-34.9) Estimated body mass index is 32.99 kg/m as calculated from the following:   Height as of this encounter: _0  (1.778 m).   Weight as of this encounter: 104.3 kg.      Subjective:  Patient feels better today, still required 2 L oxygen, no short of breath or cough.  Physical Exam: Vitals:   12/11/22 2102 12/12/22 0346 12/12/22 0737 12/12/22 0738  BP: (!) 111/55 123/62 (!) 120/53   Pulse: (!) 102 87 87   Resp: _1 Temp: 98.9 F (37.2 C) 98.5 F (36.9 C) 98.8 F (37.1 C)   TempSrc: Oral Oral Oral   SpO2: 93% 93% (!) 86% (!) 87%  Weight:      Height:       General exam: Appears calm and comfortable  Respiratory system: Clear to auscultation. Respiratory effort normal. Cardiovascular system: S1 & S2 heard, RRR. No JVD, murmurs, rubs, gallops or clicks. No pedal edema. Gastrointestinal system: Abdomen is nondistended, soft and nontender. No organomegaly or masses felt. Normal bowel sounds heard. Central nervous system: Alert and oriented  x2. No focal neurological deficits. Extremities: Symmetric 5 x 5 power. Skin: No rashes, lesions or ulcers Psychiatry: Mood & affect appropriate.   Data Reviewed:  Lab results reviewed.  Family Communication: not able to reach brother  Disposition: Status is: Inpatient Remains inpatient appropriate because: severity of disease, iv treatment, unsafe  discharge  Planned Discharge Destination: Skilled nursing facility    Time spent: 35 minutes  Author: Sharen Hones, MD 12/12/2022 12:52 PM  For on call review [email or web address].

## 2022-12-12 NOTE — TOC Progression Note (Signed)
Transition of Care [facility]) - Progression Note    Patient Details  Name: Ethan Alvarez MRN: [id number] Date of Birth: 03-15-1961  Transition of Care [facility]) CM/SW Contact  Chapman Fitch, RN Phone Number: 12/12/2022, 12:46 PM  Clinical Narrative:    Level 2 PASRR pending.  Bed search was not initiated with initial assessment.  Bed search initiated.  Updated Brother. He confirms his first 2 choices would be [facility] and [facility].  Message sent to Tammy at [facility], and Ricky at [facility] to request review    Expected Discharge Plan: Skilled Nursing Facility Barriers to Discharge: Continued Medical Work up  Expected Discharge Plan and Services   Discharge Planning Services: CM Consult   Living arrangements for the past 2 months: Single Family Home                 DME Arranged: Walker rolling                     Social Determinants of Health (SDOH) Interventions SDOH Screenings   Food Insecurity: No Food Insecurity (12/07/2022)  Housing: Low Risk  (12/07/2022)  Transportation Needs: No Transportation Needs (12/07/2022)  Utilities: Not At Risk (12/07/2022)  Tobacco Use: Low Risk  (12/06/2022)    Readmission Risk Interventions     No data to display

## 2022-12-12 NOTE — Progress Notes (Signed)
Patient refused to have dressing changed this am stating his MD would want to see it and he wasn't here.  Encouraged patient to allow this RN to provide care however patient refused.  Patient also refused 0600 meds and states he doesn't need it and will not take it.

## 2022-12-13 DIAGNOSIS — L03119 Cellulitis of unspecified part of limb: Secondary | ICD-10-CM | POA: Diagnosis not present

## 2022-12-13 DIAGNOSIS — F209 Schizophrenia, unspecified: Secondary | ICD-10-CM | POA: Diagnosis not present

## 2022-12-13 DIAGNOSIS — J9601 Acute respiratory failure with hypoxia: Secondary | ICD-10-CM | POA: Diagnosis not present

## 2022-12-13 DIAGNOSIS — J690 Pneumonitis due to inhalation of food and vomit: Secondary | ICD-10-CM | POA: Diagnosis not present

## 2022-12-13 NOTE — Progress Notes (Signed)
Pt is emotional and yelling and agitated.  IM ativan given. Refusing new iv site, pt pulled out previous. Will cont to monitor

## 2022-12-13 NOTE — TOC Progression Note (Signed)
Transition of Care [facility]) - Progression Note    Patient Details  Name: Ethan Alvarez MRN: [id number] Date of Birth: 1961/12/05  Transition of Care [facility]) CM/SW Contact  Chapman Fitch, RN Phone Number: 12/13/2022, 2:33 PM  Clinical Narrative:      Pasrr received [id number] E [facility] updated   Holly with TOC to start auth.  Potential DC tomorrow.   Sister [name] notified   Expected Discharge Plan: Skilled Nursing Facility Barriers to Discharge: Continued Medical Work up  Expected Discharge Plan and Services   Discharge Planning Services: CM Consult   Living arrangements for the past 2 months: Single Family Home                 DME Arranged: Walker rolling                     Social Determinants of Health (SDOH) Interventions SDOH Screenings   Food Insecurity: No Food Insecurity (12/07/2022)  Housing: Low Risk  (12/07/2022)  Transportation Needs: No Transportation Needs (12/07/2022)  Utilities: Not At Risk (12/07/2022)  Tobacco Use: Low Risk  (12/06/2022)    Readmission Risk Interventions     No data to display

## 2022-12-13 NOTE — Progress Notes (Signed)
Progress Note   Patient: Ethan Alvarez:[id number] DOB: [birth year] DOA: 12/06/2022     7 DOS: the patient was seen and examined on 12/13/2022   Brief hospital course:  Ethan Alvarez is a 61 y.o. male with medical history significant for schizophrenia, hypertension, obesity who presents to the emergency room via EMS for evaluation of several falls over the last couple of days and inability to ambulate. Patient was seen in the ER, 2 days prior to this admission after he fell and stubbed his toe and was noted to have some redness and swelling over his right foot.  He had a right foot x-ray which showed a closed nondisplaced fracture of the proximal phalanx of the right great toe and was discharged home on a cam boot and oral antibiotics for cellulitis. Patient's brother states that since his discharge home he has had difficulty getting around and has had several falls.   Patient is seen by podiatry, recommended 7 days of doxycycline.  However, patient has significant weakness, PT has recommended SNF placement. Patient spiked high fever at the night of 12/24.  Started Zosyn for aspiration pneumonia.  Changed to Unasyn 12/26. Antibiotics completed after today's dose, pending nursing home placement.  Assessment and Plan: Sepsis secondary to aspiration pneumonia. Not POA Aspiration pneumonia of bilateral lower lobes. Not POA Acute respiratory failure with hypoxemia secondary to aspiration pneumonia. Not POA Acute metabolic encephalopathy secondary to sepsis. Patient is spiked high fever of 102 on 12/24, he also had significant respiratory distress with a respiratory rate of 44, heart rate 100, met sepsis criteria.  He also had an increased oxygen requirement up to 6 L.  He also was unresponsive and transferred to intensive care unit. Patient was treated with Zosyn. His x-ray had bilateral lower lobe infiltrates. Condition improved today, antibiotic completed today.     Cellulitis and abscess  of foot Acute fracture of right first proximal phalanx  Frequent falls Patient has no evidence of osteomyelitis.  Doxycycline for 7 days. Patient has been eval by physical therapy, recommended nursing home placement. Antibiotics completed today.    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   Obesity (BMI 30.0-34.9) Estimated body mass index is 32.99 kg/m as calculated from the following:   Height as of this encounter: _0  (1.778 m).   Weight as of this encounter: 104.3 kg.         Subjective:  Patient condition is stable, no shortness of breath today.  Slept well last night without agitation.  Physical Exam: Vitals:   12/12/22 1930 12/13/22 0459 12/13/22 0756 12/13/22 0802  BP: 137/72 (!) 120/59 129/64   Pulse: 77 61 69 68  Resp: _1 Temp: 98.7 F (37.1 C) 97.9 F (36.6 C) 98.1 F (36.7 C)   TempSrc: Oral  Oral   SpO2: 95% 97% 100% 98%  Weight:      Height:       General exam: Appears calm and comfortable  Respiratory system: Clear to auscultation. Respiratory effort normal. Cardiovascular system: S1 & S2 heard, RRR. No JVD, murmurs, rubs, gallops or clicks. No pedal edema. Gastrointestinal system: Abdomen is nondistended, soft and nontender. No organomegaly or masses felt. Normal bowel sounds heard. Central nervous system: Alert and oriented x2. No focal neurological deficits. Extremities: Symmetric 5 x 5 power. Skin: No rashes, lesions or ulcers  Psychiatry: Judgement and insight appear normal. Mood & affect appropriate.   Data Reviewed:  Reviewed labs again.  Family Communication: None  Disposition: Status is: Inpatient Remains inpatient appropriate because: Severity of disease, IV antibiotics, unsafe discharge.  Planned Discharge Destination: Skilled nursing  facility    Time spent: 35 minutes  Author: Sharen Hones, MD 12/13/2022 11:03 AM  For on call review [email or web address].

## 2022-12-13 NOTE — TOC Progression Note (Signed)
Transition of Care [facility]) - Progression Note    Patient Details  Name: Ethan Alvarez MRN: [id number] Date of Birth: 01/13/1961  Transition of Care [facility]) CM/SW Contact  Chapman Fitch, RN Phone Number: 12/13/2022, 10:42 AM  Clinical Narrative:     Brother has accepted bed at [facility] Accepted in [facility] and notified [facility] at [facility] PASRR since pending.  Additional information sent in [facility] portal    Expected Discharge Plan: Skilled Nursing Facility Barriers to Discharge: Continued Medical Work up  Expected Discharge Plan and Services   Discharge Planning Services: CM Consult   Living arrangements for the past 2 months: Single Family Home                 DME Arranged: Walker rolling                     Social Determinants of Health (SDOH) Interventions SDOH Screenings   Food Insecurity: No Food Insecurity (12/07/2022)  Housing: Low Risk  (12/07/2022)  Transportation Needs: No Transportation Needs (12/07/2022)  Utilities: Not At Risk (12/07/2022)  Tobacco Use: Low Risk  (12/06/2022)    Readmission Risk Interventions     No data to display

## 2022-12-13 NOTE — Plan of Care (Signed)
  Problem: Education: Goal: Knowledge of General Education information will improve Description Including pain rating scale, medication(s)/side effects and non-pharmacologic comfort measures Outcome: Progressing   

## 2022-12-14 DIAGNOSIS — J9601 Acute respiratory failure with hypoxia: Secondary | ICD-10-CM | POA: Diagnosis not present

## 2022-12-14 DIAGNOSIS — L03119 Cellulitis of unspecified part of limb: Secondary | ICD-10-CM | POA: Diagnosis not present

## 2022-12-14 DIAGNOSIS — J690 Pneumonitis due to inhalation of food and vomit: Secondary | ICD-10-CM | POA: Diagnosis not present

## 2022-12-14 DIAGNOSIS — F209 Schizophrenia, unspecified: Secondary | ICD-10-CM | POA: Diagnosis not present

## 2022-12-14 MED ORDER — LACTULOSE 10 GM/15ML PO SOLN
20.0000 g | Freq: Once | ORAL | Status: AC
  Administered 2022-12-14: 20 g via ORAL
  Filled 2022-12-14: qty 30

## 2022-12-14 NOTE — TOC Progression Note (Addendum)
Transition of Care [facility]) - Progression Note    Patient Details  Name: Ethan Alvarez MRN: [id number] Date of Birth: 01/29/1961  Transition of Care [facility]) CM/SW Contact  Chapman Fitch, RN Phone Number: 12/14/2022, 11:28 AM  Clinical Narrative:     Insurance auth still pending   120 pm auth still pending   Expected Discharge Plan: Skilled Nursing Facility Barriers to Discharge: Continued Medical Work up  Expected Discharge Plan and Services   Discharge Planning Services: CM Consult   Living arrangements for the past 2 months: Single Family Home                 DME Arranged: Walker rolling                     Social Determinants of Health (SDOH) Interventions SDOH Screenings   Food Insecurity: No Food Insecurity (12/07/2022)  Housing: Low Risk  (12/07/2022)  Transportation Needs: No Transportation Needs (12/07/2022)  Utilities: Not At Risk (12/07/2022)  Tobacco Use: Low Risk  (12/06/2022)    Readmission Risk Interventions     No data to display

## 2022-12-14 NOTE — TOC Progression Note (Signed)
Transition of Care [facility]) - Progression Note    Patient Details  Name: Ethan Alvarez MRN: [id number] Date of Birth: [birth year]  Transition of Care [facility]) CM/SW Contact  Chapman Fitch, RN Phone Number: 12/14/2022, 3:13 PM  Clinical Narrative:     Insurance auth still pending  Tam[facility] at [facility] updated.  She confirms they can admit over the weekend if auth returns Sister [name] updated   Expected Discharge Plan: Skilled Nursing Facility Barriers to Discharge: Continued Medical Work up  Expected Discharge Plan and Services   Discharge Planning Services: CM Consult   Living arrangements for the past 2 months: Single Family Home                 DME Arranged: Walker rolling                     Social Determinants of Health (SDOH) Interventions SDOH Screenings   Food Insecurity: No Food Insecurity (12/07/2022)  Housing: Low Risk  (12/07/2022)  Transportation Needs: No Transportation Needs (12/07/2022)  Utilities: Not At Risk (12/07/2022)  Tobacco Use: Low Risk  (12/06/2022)    Readmission Risk Interventions     No data to display

## 2022-12-14 NOTE — Care Management Important Message (Signed)
Important Message  Patient Details  Name: Ethan Alvarez MRN: [id number] Date of Birth: [birth year]   Medicare Important Message Given:  Yes     Johnell Comings 12/14/2022, 11:08 AM

## 2022-12-14 NOTE — Progress Notes (Signed)
Pt yelling and screaming at staff. All needs assessed. IM Ativan given. Will reassessed

## 2022-12-14 NOTE — Progress Notes (Signed)
Physical Therapy Treatment Patient Details Name: Ethan Alvarez MRN: [id number] DOB: May 11, 1961 Today's Date: 12/14/2022   History of Present Illness Ethan Alvarez is a 61 y.o. male with medical history significant for schizophrenia, hypertension, obesity who presents to the emergency room via EMS for evaluation of several falls over the last couple of days and inability to ambulate.  Patient was seen in the ER, 2 days prior to this admission after he fell and stubbed his toe and was noted to have some redness and swelling over his right foot.  He had a right foot x-ray which showed a closed nondisplaced fracture of the proximal phalanx of the right great toe and was discharged home on a cam boot.  Patient's brother states that since his discharge home he has had difficulty getting around and has had several falls.    PT Comments    Patient initially refusing participation with session, but agreeable with constant encouragement and redirection.  Continues with fluctuating cognitive/psychological response to activities, but generally responds well to redirection.  Does demonstrate 'freezing-like' episodes mid-activities at times, but reorients with cuing from therapist Mod/max assist +2 for bed mobility; mod assist +1 for unsupported sitting balance, progressing to cga/close for brief periods after dynamic reaching/weight shifting tasks.  Limited ability to spontaneously recognize and correct.  Standing/OOB attempts unsafe this date due to inconsistent response to/participation with activities.     Recommendations for follow up therapy are one component of a multi-disciplinary discharge planning process, led by the attending physician.  Recommendations may be updated based on patient status, additional functional criteria and insurance authorization.  Follow Up Recommendations  Skilled nursing-short term rehab (<3 hours/day) Can patient physically be transported by private vehicle: No    Assistance Recommended at Discharge Frequent or constant Supervision/Assistance  Patient can return home with the following Two people to help with walking and/or transfers;Two people to help with bathing/dressing/bathroom   Equipment Recommendations       Recommendations for Other Services       Precautions / Restrictions Precautions Precautions: Fall Restrictions Weight Bearing Restrictions: Yes RLE Weight Bearing: Weight bearing as tolerated Other Position/Activity Restrictions: in post op shoe     Mobility  Bed Mobility Overal bed mobility: Needs Assistance Bed Mobility: Supine to Sit     Supine to sit: Mod assist, +2 for physical assistance Sit to supine: Max assist, +2 for physical assistance   General bed mobility comments: extensive physical assist to initiate, sequence movement transition    Transfers                   General transfer comment: deferred; unsafe    Ambulation/Gait               General Gait Details: deferred; unsafe   Stairs             Wheelchair Mobility    Modified Rankin (Stroke Patients Only)       Balance Overall balance assessment: Needs assistance Sitting-balance support: No upper extremity supported, Feet supported Sitting balance-Leahy Scale: Poor Sitting balance - Comments: mod assist for sitting balance (posterior lean) with initial sitting; improves to cga/close sup for periods after dynamic reaching/weight shifting activity Postural control: Posterior lean     Standing balance comment: deferred; unsafe                            Cognition Arousal/Alertness: Awake/alert Behavior During Therapy: Impulsive (mood/demeanor fluctuates  throughout session) Overall Cognitive Status: No family/caregiver present to determine baseline cognitive functioning                                          Exercises Other Exercises Other Exercises: Unsupported sitting edge of bed,  participated with dynamic reaching activities to promote core stability/activation, anterior weight shift and midline orientation.  Min/mod assist and constant redirection required to complete task.  Intermittent periods of 'freezing-like' behavior, but reorients to situation with cuing from therapist.    General Comments        Pertinent Vitals/Pain Pain Assessment Pain Assessment: Faces Faces Pain Scale: No hurt    Home Living                          Prior Function            PT Goals (current goals can now be found in the care plan section) Acute Rehab PT Goals Patient Stated Goal: to get stronger PT Goal Formulation: With patient Time For Goal Achievement: 12/22/22 Potential to Achieve Goals: Fair Progress towards PT goals: Progressing toward goals    Frequency           PT Plan Current plan remains appropriate    Co-evaluation              AM-PAC PT "6 Clicks" Mobility   Outcome Measure  Help needed turning from your back to your side while in a flat bed without using bedrails?: A Lot Help needed moving from lying on your back to sitting on the side of a flat bed without using bedrails?: A Lot Help needed moving to and from a bed to a chair (including a wheelchair)?: Total Help needed standing up from a chair using your arms (e.g., wheelchair or bedside chair)?: Total Help needed to walk in hospital room?: Total Help needed climbing 3-5 steps with a railing? : Total 6 Click Score: 8    End of Session   Activity Tolerance: Patient tolerated treatment well Patient left: in bed;with bed alarm set;with call bell/phone within reach Nurse Communication: Mobility status PT Visit Diagnosis: Unsteadiness on feet (R26.81);Repeated falls (R29.6);Muscle weakness (generalized) (M62.81);History of falling (Z91.81)     Time: 1439-1500 PT Time Calculation (min) (ACUTE ONLY): 21 min  Charges:  $Therapeutic Activity: 8-22 mins                      Moishy Laday H. Manson Passey, PT, DPT, NCS 12/14/22, 3:17 PM [phone]

## 2022-12-14 NOTE — Progress Notes (Signed)
Progress Note   Patient: Ethan Alvarez DOB: Nov 27, 1961 DOA: 12/06/2022     8 DOS: the patient was seen and examined on 12/14/2022   Brief hospital course:  Ethan Alvarez is a 61 y.o. male with medical history significant for schizophrenia, hypertension, obesity who presents to the emergency room via EMS for evaluation of several falls over the last couple of days and inability to ambulate. Patient was seen in the ER, 2 days prior to this admission after he fell and stubbed his toe and was noted to have some redness and swelling over his right foot.  He had a right foot x-ray which showed a closed nondisplaced fracture of the proximal phalanx of the right great toe and was discharged home on a cam boot and oral antibiotics for cellulitis. Patient's brother states that since his discharge home he has had difficulty getting around and has had several falls.   Patient is seen by podiatry, recommended 7 days of doxycycline.  However, patient has significant weakness, PT has recommended SNF placement. Patient spiked high fever at the night of 12/24.  Started Zosyn for aspiration pneumonia.  Changed to Unasyn 12/26. Antibiotics completed after today's dose, pending nursing home placement.  Assessment and Plan: Sepsis secondary to aspiration pneumonia. Not POA Aspiration pneumonia of bilateral lower lobes. Not POA Acute respiratory failure with hypoxemia secondary to aspiration pneumonia. Not POA Acute metabolic encephalopathy secondary to sepsis. Patient is spiked high fever of 102 on 12/24, he also had significant respiratory distress with a respiratory rate of 44, heart rate 100, met sepsis criteria.  He also had an increased oxygen requirement up to 6 L.  He also was unresponsive and transferred to intensive care unit. Patient was treated with Zosyn. His x-ray had bilateral lower lobe infiltrates. No new issues, pending nursing home placement.     Cellulitis and abscess of  foot Acute fracture of right first proximal phalanx  Frequent falls Patient has no evidence of osteomyelitis.  Doxycycline for 7 days. Patient has been eval by physical therapy, recommended nursing home placement. Antibiotics completed.    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 Patient has no more agitation. Obesity (BMI 30.0-34.9) Estimated body mass index is 32.99 kg/m as calculated from the following:   Height as of this encounter: _0  (1.778 m).   Weight as of this encounter: 104.3 kg.         Subjective:  Patient doing well today, no confusion.  No complaints.  Physical Exam: Vitals:   12/13/22 2109 12/14/22 0520 12/14/22 0738 12/14/22 0759  BP:  112/63  (!) 105/46  Pulse:  75  74  Resp:  18  18  Temp:  97.7 F (36.5 C)  98.6 F (37 C)  TempSrc:      SpO2: 94% 94% 95% 96%  Weight:      Height:       General exam: Appears calm and comfortable  Respiratory system: Clear to auscultation. Respiratory effort normal. Cardiovascular system: S1 & S2 heard, RRR. No JVD, murmurs, rubs, gallops or clicks. No pedal edema. Gastrointestinal system: Abdomen is nondistended, soft and nontender. No organomegaly or masses felt. Normal bowel sounds heard. Central nervous system: Alert and oriented. No focal neurological deficits. Extremities: Symmetric 5 x 5 power. Skin: No rashes, lesions or ulcers Psychiatry: Judgement and insight appear normal. Mood & affect appropriate.  Data Reviewed:  There are no new results to review at this time.  Family Communication: None  Disposition: Status is: Inpatient Remains inpatient appropriate because: Unsafe discharge, pending nursing home placement.  Planned Discharge Destination: Skilled nursing facility    Time spent: 35 minutes  Author: Sharen Hones, MD 12/14/2022  1:03 PM  For on call review [email or web address].

## 2022-12-14 NOTE — Progress Notes (Signed)
Occupational Therapy Treatment Patient Details Name: Ethan Alvarez MRN: [id number] DOB: 12/11/1961 Today's Date: 12/14/2022   History of present illness Ethan Alvarez is a 61 y.o. male with medical history significant for schizophrenia, hypertension, obesity who presents to the emergency room via EMS for evaluation of several falls over the last couple of days and inability to ambulate.  Patient was seen in the ER, 2 days prior to this admission after he fell and stubbed his toe and was noted to have some redness and swelling over his right foot.  He had a right foot x-ray which showed a closed nondisplaced fracture of the proximal phalanx of the right great toe and was discharged home on a cam boot.  Patient's brother states that since his discharge home he has had difficulty getting around and has had several falls.   OT comments  Chart reviewed, pt greeted in bed agreeable to OT tx session. Pt is alert, oriented x4, increased time for processing with frequent multi modal cueing required. Tx session targeted improving static/dynamic sitting balance in preparation for eventual toilet transfers. Post op shoe donned while edge of bed with MAX A. Pt requires at least CGA for static sitting on edge of bed with frequent posterior LOB requiring MAX A to correct. MAX A +2 required for sit>supine. Pt is making slow progress towards goals, discharge recommendation remains appropriate. OT will continue to follow acutely.    Recommendations for follow up therapy are one component of a multi-disciplinary discharge planning process, led by the attending physician.  Recommendations may be updated based on patient status, additional functional criteria and insurance authorization.    Follow Up Recommendations  Skilled nursing-short term rehab (<3 hours/day)     Assistance Recommended at Discharge Frequent or constant Supervision/Assistance  Patient can return home with the following  Two people to help with  walking and/or transfers;Two people to help with bathing/dressing/bathroom   Equipment Recommendations  Other (comment) (defer)    Recommendations for Other Services      Precautions / Restrictions Precautions Precautions: Fall Restrictions Weight Bearing Restrictions: Yes RLE Weight Bearing: Weight bearing as tolerated Other Position/Activity Restrictions: in post op shoe       Mobility Bed Mobility Overal bed mobility: Needs Assistance Bed Mobility: Sit to Supine, Supine to Sit     Supine to sit: Max assist Sit to supine: Max assist, +2 for safety/equipment        Transfers                         Balance Overall balance assessment: Needs assistance, History of Falls Sitting-balance support: Feet supported Sitting balance-Leahy Scale: Poor Sitting balance - Comments: at least CGA for static sitting balance with frequent posterior loss of balance, seated for approx 8 minutes Postural control: Posterior lean   Standing balance-Leahy Scale: Zero                             ADL either performed or assessed with clinical judgement   ADL Overall ADL's : Needs assistance/impaired                     Lower Body Dressing: Maximal assistance Lower Body Dressing Details (indicate cue type and reason): post op shoe     Toileting- Clothing Manipulation and Hygiene: Maximal assistance;Bed level              Extremity/Trunk  Assessment              Vision       Perception     Praxis      Cognition Arousal/Alertness: Awake/alert Behavior During Therapy: WFL for tasks assessed/performed Overall Cognitive Status: No family/caregiver present to determine baseline cognitive functioning Area of Impairment: Attention, Memory, Following commands, Safety/judgement, Awareness, Problem solving                   Current Attention Level: Selective Memory: Decreased short-term memory Following Commands: Follows one step  commands with increased time Safety/Judgement: Decreased awareness of deficits Awareness: Emergent Problem Solving: Slow processing, Requires verbal cues, Requires tactile cues          Exercises      Shoulder Instructions       General Comments vital signs monitored, appear stable throughout; NT present at end of session for assist onto bed pan as pt reports urgent need for BM    Pertinent Vitals/ Pain       Pain Assessment Pain Assessment: No/denies pain  Home Living                                          Prior Functioning/Environment              Frequency  Min 2X/week        Progress Toward Goals  OT Goals(current goals can now be found in the care plan section)  Progress towards OT goals: Progressing toward goals     Plan Discharge plan remains appropriate    Co-evaluation                 AM-PAC OT "6 Clicks" Daily Activity     Outcome Measure   Help from another person eating meals?: A Little Help from another person taking care of personal grooming?: A Little Help from another person toileting, which includes using toliet, bedpan, or urinal?: A Lot Help from another person bathing (including washing, rinsing, drying)?: A Lot Help from another person to put on and taking off regular upper body clothing?: A Little Help from another person to put on and taking off regular lower body clothing?: A Lot 6 Click Score: 15    End of Session Equipment Utilized During Treatment: Other (comment) (post op shoe)  OT Visit Diagnosis: Unsteadiness on feet (R26.81);Muscle weakness (generalized) (M62.81)   Activity Tolerance Patient tolerated treatment well   Patient Left in bed;with call bell/phone within reach;with bed alarm set   Nurse Communication Mobility status        Time: 1117-3567 OT Time Calculation (min): 13 min  Charges: OT General Charges $OT Visit: 1 Visit OT Treatments $Therapeutic Activity: 8-22 mins Oleta Mouse, OTD OTR/L  12/14/22, 11:02 AM

## 2022-12-14 NOTE — TOC Progression Note (Signed)
Transition of Care [facility]) - Progression Note    Patient Details  Name: Ethan Alvarez MRN: [id number] Date of Birth: 1961/11/05  Transition of Care [facility]) CM/SW Contact  Chapman Fitch, RN Phone Number: 12/14/2022, 9:40 AM  Clinical Narrative:    SNF [facility] pending   Expected Discharge Plan: Skilled Nursing Facility Barriers to Discharge: Continued Medical Work up  Expected Discharge Plan and Services   Discharge Planning Services: CM Consult   Living arrangements for the past 2 months: Single Family Home                 DME Arranged: Walker rolling                     Social Determinants of Health (SDOH) Interventions SDOH Screenings   Food Insecurity: No Food Insecurity (12/07/2022)  Housing: Low Risk  (12/07/2022)  Transportation Needs: No Transportation Needs (12/07/2022)  Utilities: Not At Risk (12/07/2022)  Tobacco Use: Low Risk  (12/06/2022)    Readmission Risk Interventions     No data to display

## 2022-12-15 DIAGNOSIS — F411 Generalized anxiety disorder: Secondary | ICD-10-CM | POA: Diagnosis not present

## 2022-12-15 DIAGNOSIS — L02619 Cutaneous abscess of unspecified foot: Secondary | ICD-10-CM | POA: Diagnosis not present

## 2022-12-15 DIAGNOSIS — F209 Schizophrenia, unspecified: Secondary | ICD-10-CM | POA: Diagnosis not present

## 2022-12-15 DIAGNOSIS — L03119 Cellulitis of unspecified part of limb: Secondary | ICD-10-CM | POA: Diagnosis not present

## 2022-12-15 LAB — CULTURE, BLOOD (ROUTINE X 2)
Culture: NO GROWTH
Culture: NO GROWTH
Special Requests: ADEQUATE
Special Requests: ADEQUATE

## 2022-12-15 LAB — URINALYSIS, COMPLETE (UACMP) WITH MICROSCOPIC
Bilirubin Urine: NEGATIVE
Glucose, UA: NEGATIVE mg/dL
Hgb urine dipstick: NEGATIVE
Ketones, ur: NEGATIVE mg/dL
Leukocytes,Ua: NEGATIVE
Nitrite: NEGATIVE
Protein, ur: NEGATIVE mg/dL
Specific Gravity, Urine: 1.019 (ref 1.005–1.030)
pH: 7 (ref 5.0–8.0)

## 2022-12-15 MED ORDER — TAMSULOSIN HCL 0.4 MG PO CAPS
0.4000 mg | ORAL_CAPSULE | Freq: Every day | ORAL | Status: DC
  Administered 2022-12-15 – 2022-12-18 (×4): 0.4 mg via ORAL
  Filled 2022-12-15 (×4): qty 1

## 2022-12-15 MED ORDER — DIPHENHYDRAMINE HCL 50 MG/ML IJ SOLN
50.0000 mg | Freq: Two times a day (BID) | INTRAMUSCULAR | Status: DC | PRN
  Administered 2022-12-16 – 2022-12-17 (×2): 50 mg via INTRAMUSCULAR
  Filled 2022-12-15 (×2): qty 1

## 2022-12-15 MED ORDER — DIPHENHYDRAMINE HCL 25 MG PO CAPS
50.0000 mg | ORAL_CAPSULE | Freq: Two times a day (BID) | ORAL | Status: DC | PRN
  Administered 2022-12-15 – 2022-12-18 (×3): 50 mg via ORAL
  Filled 2022-12-15 (×3): qty 2

## 2022-12-15 MED ORDER — OLANZAPINE 10 MG IM SOLR
5.0000 mg | Freq: Two times a day (BID) | INTRAMUSCULAR | Status: DC | PRN
  Administered 2022-12-16 – 2022-12-17 (×2): 5 mg via INTRAMUSCULAR
  Filled 2022-12-15 (×3): qty 10

## 2022-12-15 MED ORDER — OLANZAPINE 5 MG PO TABS
5.0000 mg | ORAL_TABLET | Freq: Two times a day (BID) | ORAL | Status: DC | PRN
  Administered 2022-12-17: 5 mg via ORAL
  Filled 2022-12-15: qty 1

## 2022-12-15 MED ORDER — OLANZAPINE 7.5 MG PO TABS
7.5000 mg | ORAL_TABLET | Freq: Two times a day (BID) | ORAL | Status: DC
  Administered 2022-12-15 – 2022-12-18 (×6): 7.5 mg via ORAL
  Filled 2022-12-15 (×7): qty 1

## 2022-12-15 MED ORDER — LACTATED RINGERS IV SOLN: INTRAVENOUS | Status: AC

## 2022-12-15 NOTE — Progress Notes (Signed)
Patient starting to get agitated. Yelling at staff. All needs assessed. Pain 0/10. Refused his meal. PRN IM ativan given

## 2022-12-15 NOTE — Progress Notes (Addendum)
Progress Note   Patient: Ethan Alvarez ZLD:[id number] DOB: Mar 06, 1961 DOA: 12/06/2022     9 DOS: the patient was seen and examined on 12/15/2022   Brief hospital course:  Ethan Alvarez is a 61 y.o. male with medical history significant for schizophrenia, hypertension, obesity who presents to the emergency room via EMS for evaluation of several falls over the last couple of days and inability to ambulate. Patient was seen in the ER, 2 days prior to this admission after he fell and stubbed his toe and was noted to have some redness and swelling over his right foot.  He had a right foot x-ray which showed a closed nondisplaced fracture of the proximal phalanx of the right great toe and was discharged home on a cam boot and oral antibiotics for cellulitis. Patient's brother states that since his discharge home he has had difficulty getting around and has had several falls.   Patient is seen by podiatry, recommended 7 days of doxycycline.  However, patient has significant weakness, PT has recommended SNF placement. Patient spiked high fever at the night of 12/24.  Started Zosyn for aspiration pneumonia.  Changed to Unasyn 12/26. Antibiotics completed after today's dose, pending nursing home placement.  Assessment and Plan: Sepsis secondary to aspiration pneumonia. Not POA Aspiration pneumonia of bilateral lower lobes. Not POA Acute respiratory failure with hypoxemia secondary to aspiration pneumonia. Not POA Acute metabolic encephalopathy secondary to sepsis. Patient is spiked high fever of 102 on 12/24, he also had significant respiratory distress with a respiratory rate of 44, heart rate 100, met sepsis criteria.  He also had an increased oxygen requirement up to 6 L.  He also was unresponsive and transferred to intensive care unit. Patient was treated with Zosyn. His x-ray had bilateral lower lobe infiltrates. Condition has improved, patient completed antibiotics.     Cellulitis and abscess  of foot Acute fracture of right first proximal phalanx  Frequent falls Patient has no evidence of osteomyelitis.  Doxycycline for 7 days. Patient has been eval by physical therapy, recommended nursing home placement. Antibiotics completed.    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 Patient is more agitated yesterday evening, received a dose of Ativan.  He is not feeling well, I examed his right foot, does not have worsening redness or tenderness. He has a poor appetite, I will restart iv fluids, check UA.   Obesity (BMI 30.0-34.9) Estimated body mass index is 32.99 kg/m as calculated from the following:   Height as of this encounter: _0  (1.778 m).   Weight as of this encounter: 104.3 kg.      Subjective:  Patient was agitated again yesterday.  States that he does not feel well, but does not have any specific complaints.  Physical Exam: Vitals:   12/14/22 1927 12/14/22 2208 12/15/22 0424 12/15/22 0746  BP: (!) 106/44  110/60 (!) 120/51  Pulse: 74  73 70  Resp: _1 Temp: 98.8 F (37.1 C)  98.7 F (37.1 C) 98.1 F (36.7 C)  TempSrc: Oral  Oral   SpO2: 99% (!) 87% 97% 93%  Weight:      Height:       General exam: Appears calm and comfortable  Respiratory system: Clear to auscultation. Respiratory effort normal. Cardiovascular system: S1 & S2 heard, RRR. No JVD, murmurs, rubs, gallops or clicks. No pedal edema.  Gastrointestinal system: Abdomen is nondistended, soft and nontender. No organomegaly or masses felt. Normal bowel sounds heard. Central nervous system: Alert and oriented. No focal neurological deficits. Extremities: Symmetric 5 x 5 power. Skin: No rashes, lesions or ulcers Psychiatry: Judgement and insight appear normal. Mood & affect appropriate.   Data Reviewed:  There are no new results to  review at this time.  Family Communication: None  Disposition: Status is: Inpatient Remains inpatient appropriate because: Unsafe discharge, pending nursing home placement.  Planned Discharge Destination: Skilled nursing facility    Time spent: 35 minutes  Author: Sharen Hones, MD 12/15/2022 1:13 PM  For on call review [email or web address].

## 2022-12-16 DIAGNOSIS — F411 Generalized anxiety disorder: Secondary | ICD-10-CM | POA: Diagnosis not present

## 2022-12-16 DIAGNOSIS — G928 Other toxic encephalopathy: Secondary | ICD-10-CM

## 2022-12-16 DIAGNOSIS — F209 Schizophrenia, unspecified: Secondary | ICD-10-CM | POA: Diagnosis not present

## 2022-12-16 DIAGNOSIS — L03119 Cellulitis of unspecified part of limb: Secondary | ICD-10-CM | POA: Diagnosis not present

## 2022-12-16 LAB — CBC
HCT: 40 % (ref 39.0–52.0)
Hemoglobin: 13.2 g/dL (ref 13.0–17.0)
MCH: 32.3 pg (ref 26.0–34.0)
MCHC: 33 g/dL (ref 30.0–36.0)
MCV: 97.8 fL (ref 80.0–100.0)
Platelets: 114 10*3/uL — ABNORMAL LOW (ref 150–400)
RBC: 4.09 MIL/uL — ABNORMAL LOW (ref 4.22–5.81)
RDW: 12.2 % (ref 11.5–15.5)
WBC: 3.9 10*3/uL — ABNORMAL LOW (ref 4.0–10.5)
nRBC: 0 % (ref 0.0–0.2)

## 2022-12-16 LAB — BASIC METABOLIC PANEL
Anion gap: 6 (ref 5–15)
BUN: 19 mg/dL (ref 6–20)
CO2: 32 mmol/L (ref 22–32)
Calcium: 9.1 mg/dL (ref 8.9–10.3)
Chloride: 105 mmol/L (ref 98–111)
Creatinine, Ser: 0.84 mg/dL (ref 0.61–1.24)
GFR, Estimated: >60 mL/min (ref >60)
Glucose, Bld: 98 mg/dL (ref 70–99)
Potassium: 4.2 mmol/L (ref 3.5–5.1)
Sodium: 143 mmol/L (ref 135–145)

## 2022-12-16 LAB — URINE CULTURE: Culture: NO GROWTH

## 2022-12-16 LAB — MAGNESIUM: Magnesium: 2.3 mg/dL (ref 1.7–2.4)

## 2022-12-16 NOTE — Progress Notes (Signed)
Progress Note   Patient: Ethan Alvarez ZOX:[id number] DOB: 1961/06/26 DOA: 12/06/2022     10 DOS: the patient was seen and examined on 12/16/2022   Brief hospital course:  Ethan Alvarez is a 61 y.o. male with medical history significant for schizophrenia, hypertension, obesity who presents to the emergency room via EMS for evaluation of several falls over the last couple of days and inability to ambulate. Patient was seen in the ER, 2 days prior to this admission after he fell and stubbed his toe and was noted to have some redness and swelling over his right foot.  He had a right foot x-ray which showed a closed nondisplaced fracture of the proximal phalanx of the right great toe and was discharged home on a cam boot and oral antibiotics for cellulitis. Patient's brother states that since his discharge home he has had difficulty getting around and has had several falls.   Patient is seen by podiatry, recommended 7 days of doxycycline.  However, patient has significant weakness, PT has recommended SNF placement. Patient spiked high fever at the night of 12/24.  Started Zosyn for aspiration pneumonia.  Changed to Unasyn 12/26. 12/30. Antibiotics completed after today's dose, pending nursing home placement.  Assessment and Plan: Sepsis secondary to aspiration pneumonia. Not POA Aspiration pneumonia of bilateral lower lobes. Not POA Acute respiratory failure with hypoxemia secondary to aspiration pneumonia. Not POA Acute metabolic encephalopathy secondary to sepsis. Patient is spiked high fever of 102 on 12/24, he also had significant respiratory distress with a respiratory rate of 44, heart rate 100, met sepsis criteria.  He also had an increased oxygen requirement up to 6 L.  He also was unresponsive and transferred to intensive care unit. Patient was treated with Zosyn. His x-ray had bilateral lower lobe infiltrates. Condition has improved, patient completed antibiotics.     Cellulitis and  abscess of foot Acute fracture of right first proximal phalanx  Frequent falls Patient has no evidence of osteomyelitis.  Doxycycline for 7 days. Patient has been eval by physical therapy, recommended nursing home placement. Antibiotics completed.    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 I restarted IV fluids yesterday due to poor p.o. intake.  Patient feels better today, no longer agitated.  UA does not support UTI.  Patient does not have additional respiratory symptoms to suggest aspiration pneumonia.  Constipation, last bowel movement yesterday. Discontinue IV fluids after today's infusion.   Obesity (BMI 30.0-34.9) Estimated body mass index is 32.99 kg/m as calculated from the following:   Height as of this encounter: _0  (1.778 m).   Weight as of this encounter: 104.3 kg.      Subjective:  Patient is doing much better today, less agitation.  Denies any dysuria or hematuria.  No shortness of breath or cough.  Physical Exam: Vitals:   12/15/22 1452 12/15/22 1947 12/16/22 0449 12/16/22 0734  BP: (!) 101/43 119/60 114/71 115/62  Pulse: 69 80 75 69  Resp: _1 Temp: 98.1 F (36.7 C) 97.8 F (36.6 C) 98.1 F (36.7 C) 98.1 F (36.7 C)  TempSrc:  Oral Oral   SpO2: 97% 95% 96% 97%  Weight:      Height:       General exam: Appears calm and comfortable  Respiratory system: Clear to auscultation. Respiratory effort normal. Cardiovascular system: S1 & S2 heard, RRR.  No JVD, murmurs, rubs, gallops or clicks. No pedal edema. Gastrointestinal system: Abdomen is nondistended, soft and nontender. No organomegaly or masses felt. Normal bowel sounds heard. Central nervous system: Alert and oriented. No focal neurological deficits. Extremities: Symmetric 5 x 5 power. Skin: No rashes, lesions or ulcers Psychiatry:  Judgement and insight appear normal. Mood & affect appropriate.   Data Reviewed:  Lab results reviewed.  Family Communication: None  Disposition: Status is: Inpatient Remains inpatient appropriate because: Unsafe discharge, pending nursing placement.  Planned Discharge Destination: Skilled nursing facility    Time spent: 35 minutes  Author: Sharen Hones, MD 12/16/2022 11:22 AM  For on call review [email or web address].

## 2022-12-17 DIAGNOSIS — L02619 Cutaneous abscess of unspecified foot: Secondary | ICD-10-CM | POA: Diagnosis not present

## 2022-12-17 DIAGNOSIS — J690 Pneumonitis due to inhalation of food and vomit: Secondary | ICD-10-CM | POA: Diagnosis not present

## 2022-12-17 DIAGNOSIS — G928 Other toxic encephalopathy: Secondary | ICD-10-CM | POA: Diagnosis not present

## 2022-12-17 DIAGNOSIS — L03119 Cellulitis of unspecified part of limb: Secondary | ICD-10-CM | POA: Diagnosis not present

## 2022-12-17 NOTE — Progress Notes (Signed)
Progress Note   Patient: Ethan Alvarez UUE:[id number] DOB: August 09, 1961 DOA: 12/06/2022     11 DOS: the patient was seen and examined on 12/17/2022   Brief hospital course:  Ethan Alvarez is a 62 y.o. male with medical history significant for schizophrenia, hypertension, obesity who presents to the emergency room via EMS for evaluation of several falls over the last couple of days and inability to ambulate. Patient was seen in the ER, 2 days prior to this admission after he fell and stubbed his toe and was noted to have some redness and swelling over his right foot.  He had a right foot x-ray which showed a closed nondisplaced fracture of the proximal phalanx of the right great toe and was discharged home on a cam boot and oral antibiotics for cellulitis. Patient's brother states that since his discharge home he has had difficulty getting around and has had several falls.   Patient is seen by podiatry, recommended 7 days of doxycycline.  However, patient has significant weakness, PT has recommended SNF placement. Patient spiked high fever at the night of 12/24.  Started Zosyn for aspiration pneumonia.  Changed to Unasyn 12/26. 12/30. Antibiotics completed after today's dose, pending nursing home placement.  Assessment and Plan: Sepsis secondary to aspiration pneumonia. Not POA Aspiration pneumonia of bilateral lower lobes. Not POA Acute respiratory failure with hypoxemia secondary to aspiration pneumonia. Not POA Acute metabolic encephalopathy secondary to sepsis. Patient is spiked high fever of 102 on 12/24, he also had significant respiratory distress with a respiratory rate of 44, heart rate 100, met sepsis criteria.  He also had an increased oxygen requirement up to 6 L.  He also was unresponsive and transferred to intensive care unit. Patient was treated with Zosyn. His x-ray had bilateral lower lobe infiltrates. Condition has improved, patient completed antibiotics.     Cellulitis and  abscess of foot Acute fracture of right first proximal phalanx  Frequent falls Patient has no evidence of osteomyelitis.  Doxycycline for 7 days. Patient has been eval by physical therapy, recommended nursing home placement. Antibiotics completed.    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 Patient had some dehydration, received fluids.  No longer has any agitation today.   Obesity (BMI 30.0-34.9) Estimated body mass index is 32.99 kg/m as calculated from the following:   Height as of this encounter: _0  (1.778 m).   Weight as of this encounter: 104.3 kg.        Subjective:  Patient doing well, no complaints today.  Physical Exam: Vitals:   12/16/22 1525 12/16/22 1957 12/17/22 0441 12/17/22 0700  BP: (!) 127/55 (!) 112/58 (!) 128/48 (!) 120/57  Pulse: 76 70 71 73  Resp: _1 Temp: 98 F (36.7 C) 97.9 F (36.6 C) 97.8 F (36.6 C) 98.2 F (36.8 C)  TempSrc:  Oral Oral Tympanic  SpO2: 96% 95% 98% 96%  Weight:      Height:       General exam: Appears calm and comfortable  Respiratory system: Clear to auscultation. Respiratory effort normal. Cardiovascular system: S1 & S2 heard, RRR. No JVD, murmurs, rubs, gallops or clicks. No pedal edema. Gastrointestinal system: Abdomen is nondistended, soft and nontender. No organomegaly or masses felt. Normal bowel sounds heard. Central nervous system: Alert and oriented. No focal neurological deficits. Extremities: Symmetric 5 x 5 power. Skin: No  rashes, lesions or ulcers Psychiatry: Judgement and insight appear normal. Mood & affect appropriate.   Data Reviewed:  There are no new results to review at this time.  Family Communication:   Disposition: Status is: Inpatient Remains inpatient appropriate because:Unsafe discharge, pending nursing home  placement.  Planned Discharge Destination: Skilled nursing facility    Time spent: 30 minutes  Author: Sharen Hones, MD 12/17/2022 1:02 PM  For on call review [email or web address].

## 2022-12-17 NOTE — TOC Progression Note (Signed)
Transition of Care [facility]) - Progression Note    Patient Details  Name: Ethan Alvarez MRN: [id number] Date of Birth: January 30, 1961  Transition of Care [facility]) CM/SW [place], LCSW Phone Number: 12/17/2022, 8:36 AM  Clinical Narrative:  [facility] requesting updated therapy notes. CSW uploaded into portal.   Expected Discharge Plan: [place] Barriers to Discharge: Continued Medical Work up  Expected Discharge Plan and Services   Discharge Planning Services: CM Consult   Living arrangements for the past 2 months: Single Family Home                 DME Arranged: Walker rolling                     Social Determinants of Health (SDOH) Interventions SDOH Screenings   Food Insecurity: No Food Insecurity (12/07/2022)  Housing: Low Risk  (12/07/2022)  Transportation Needs: No Transportation Needs (12/07/2022)  Utilities: Not At Risk (12/07/2022)  Tobacco Use: Low Risk  (12/06/2022)    Readmission Risk Interventions     No data to display

## 2022-12-18 DIAGNOSIS — F209 Schizophrenia, unspecified: Secondary | ICD-10-CM | POA: Diagnosis not present

## 2022-12-18 DIAGNOSIS — J690 Pneumonitis due to inhalation of food and vomit: Secondary | ICD-10-CM | POA: Diagnosis not present

## 2022-12-18 DIAGNOSIS — J9601 Acute respiratory failure with hypoxia: Secondary | ICD-10-CM | POA: Diagnosis not present

## 2022-12-18 DIAGNOSIS — L03119 Cellulitis of unspecified part of limb: Secondary | ICD-10-CM | POA: Diagnosis not present

## 2022-12-18 MED ORDER — LORAZEPAM 1 MG PO TABS: 1.0000 mg | ORAL_TABLET | Freq: Three times a day (TID) | ORAL | 0 refills | Status: DC | PRN

## 2022-12-18 MED ORDER — TAMSULOSIN HCL 0.4 MG PO CAPS: 0.4000 mg | ORAL_CAPSULE | Freq: Every day | ORAL | Status: DC

## 2022-12-18 MED ORDER — OLANZAPINE 5 MG PO TABS: 7.5000 mg | ORAL_TABLET | Freq: Two times a day (BID) | ORAL | Status: DC

## 2022-12-18 NOTE — Plan of Care (Signed)
  Problem: Education: Goal: Knowledge of General Education information will improve Description: Including pain rating scale, medication(s)/side effects and non-pharmacologic comfort measures Outcome: Progressing   Problem: Health Behavior/Discharge Planning: Goal: Ability to manage health-related needs will improve Outcome: Progressing   Problem: Activity: Goal: Risk for activity intolerance will decrease Outcome: Progressing   Problem: Coping: Goal: Level of anxiety will decrease Outcome: Progressing   Problem: Elimination: Goal: Will not experience complications related to bowel motility Outcome: Progressing Goal: Will not experience complications related to urinary retention Outcome: Progressing   Problem: Safety: Goal: Ability to remain free from injury will improve Outcome: Progressing   Problem: Skin Integrity: Goal: Risk for impaired skin integrity will decrease Outcome: Progressing

## 2022-12-18 NOTE — TOC Transition Note (Signed)
Transition of Care [facility]) - CM/SW Discharge Note   Patient Details  Name: Ethan Alvarez MRN: [id number] Date of Birth: 1960-12-29  Transition of Care [facility]) CM/SW Contact:  Beverly Sessions, RN Phone Number: 12/18/2022, 10:30 AM   Clinical Narrative:     Patient will DC to: [facility] Anticipated DC date: .12/18/22  Family notified: [name] and [name] Transport by: Johnanna Schneiders  Per MD patient ready for DC to . RN, patient, patient's family, and facility notified of DC. Discharge Summary sent to facility. RN given number for report. DC packet on chart. Ambulance transport requested for patient.  TOC signing off.  Isaias Cowman [facility] [phone]   Final next level of care: Skilled Nursing Facility Barriers to Discharge: Continued Medical Work up   Patient Goals and CMS Choice CMS Medicare.gov Compare Post Acute Care list provided to:: Patient Represenative (must comment) Choice offered to / list presented to : [place] / [place]  Discharge Placement                         Discharge Plan and Services Additional resources added to the After Visit Summary for     Discharge Planning Services: CM Consult            DME Arranged: Walker rolling                    Social Determinants of Health (SDOH) Interventions SDOH Screenings   Food Insecurity: No Food Insecurity (12/07/2022)  Housing: Low Risk  (12/07/2022)  Transportation Needs: No Transportation Needs (12/07/2022)  Utilities: Not At Risk (12/07/2022)  Tobacco Use: Low Risk  (12/06/2022)     Readmission Risk Interventions     No data to display

## 2022-12-18 NOTE — Care Management Important Message (Signed)
Important Message  Patient Details  Name: Ethan Alvarez MRN: [id number] Date of Birth: 12/17/1961   Medicare Important Message Given:  Other (see comment)  Attempted to reach Dolores Hoose, legal guardian, at [phone] to review Medicare IM.  Mailbox full, unable to leave voicemail at this time.   Dannette Barbara 12/18/2022, 9:51 AM

## 2022-12-18 NOTE — TOC Progression Note (Signed)
Transition of Care [facility]) - Progression Note    Patient Details  Name: Ethan Alvarez MRN: [id number] Date of Birth: 12/06/1961  Transition of Care [facility]) CM/SW Contact  Beverly Sessions, RN Phone Number: 12/18/2022, 9:32 AM  Clinical Narrative:     Insurance auth approved through 1/3.  Auth ID [id number] MD and tammy at [facility] notified   Expected Discharge Plan: [place] Barriers to Discharge: Continued Medical Work up  Expected Discharge Plan and Services   Discharge Planning Services: CM Consult   Living arrangements for the past 2 months: Single Family Home                 DME Arranged: Walker rolling                     Social Determinants of Health (SDOH) Interventions SDOH Screenings   Food Insecurity: No Food Insecurity (12/07/2022)  Housing: Low Risk  (12/07/2022)  Transportation Needs: No Transportation Needs (12/07/2022)  Utilities: Not At Risk (12/07/2022)  Tobacco Use: Low Risk  (12/06/2022)    Readmission Risk Interventions     No data to display

## 2022-12-18 NOTE — Discharge Summary (Signed)
Physician Discharge Summary   Patient: Ethan Alvarez MRN: [id number] DOB: 1961-12-03  Admit date:     12/06/2022  Discharge date: 12/18/22  Discharge Physician: Sharen Hones   PCP: Latanya Maudlin, NP   Recommendations at discharge:   With PCP in 1 week. Follow-up with psychiatry as previously scheduled.  Discharge Diagnoses: Principal Problem:   Anxiety state Active Problems:   Cellulitis and abscess of foot   Frequent falls   Thrombocytopenia (HCC)   Schizophrenia (HCC)   Borderline intellectual functioning   Obesity (BMI 30.0-34.9)   Sepsis ([place])   Closed nondisplaced fracture of proximal phalanx of right great toe   Acute hypoxic respiratory failure ([place])   Toxic metabolic encephalopathy   Aspiration pneumonia of both lower lobes due to regurgitated food ([place])  Resolved Problems:   * No resolved hospital problems. *  Hospital Course:  [name] is a 62 y.o. male with medical history significant for schizophrenia, hypertension, obesity who presents to the emergency room via EMS for evaluation of several falls over the last couple of days and inability to ambulate. Patient was seen in the ER, 2 days prior to this admission after he fell and stubbed his toe and was noted to have some redness and swelling over his right foot.  He had a right foot x-ray which showed a closed nondisplaced fracture of the proximal phalanx of the right great toe and was discharged home on a cam boot and oral antibiotics for cellulitis. Patient's brother states that since his discharge home he has had difficulty getting around and has had several falls.   Patient is seen by podiatry, recommended 7 days of doxycycline.  However, patient has significant weakness, PT has recommended SNF placement. Patient spiked high fever at the night of 12/24.  Started Zosyn for aspiration pneumonia.  Changed to Unasyn 12/26. 12/30. Antibiotics completed after today's dose, pending nursing home  placement.  Assessment and Plan: Sepsis secondary to aspiration pneumonia. Not POA Aspiration pneumonia of bilateral lower lobes. Not POA Acute respiratory failure with hypoxemia secondary to aspiration pneumonia. Not POA Acute metabolic encephalopathy secondary to sepsis. Patient is spiked high fever of 102 on 12/24, he also had significant respiratory distress with a respiratory rate of 44, heart rate 100, met sepsis criteria.  He also had an increased oxygen requirement up to 6 L.  He also was unresponsive and transferred to intensive care unit. Patient was treated with Zosyn. His x-ray had bilateral lower lobe infiltrates. Condition has improved, patient completed antibiotics.     Cellulitis and abscess of foot Acute fracture of right first proximal phalanx  Frequent falls Patient has no evidence of osteomyelitis.  Doxycycline for 7 days. Patient has been eval by physical therapy, recommended nursing home placement. Antibiotics completed.    Thrombocytopenia ([place]) Stable   Schizophrenia ([place]) Borderline intellectual functioning Patient has been seen by psychiatry, recommended continue as needed Ativan and continue home psych medicines. Patient had some dehydration, received fluids.  No longer has any agitation.   Obesity (BMI 30.0-34.9) Estimated body mass index is 32.99 kg/m as calculated from the following:   Height as of this encounter: _0  (1.778 m).   Weight as of this encounter: 104.3 kg.         Consultants: Psychiatry Procedures performed: None  Disposition: Skilled nursing facility Diet recommendation:  Discharge Diet Orders (From admission, onward)     Start     Ordered   12/18/22 0000  Diet general  Comments: Dys 3 diet   12/18/22 0935           Dysphagia type 3 thin Liquid DISCHARGE MEDICATION: Allergies as of 12/18/2022       Reactions   Haloperidol Lactate Other (See Comments)   Per [facility]; nature of intolerance  unclear intolerance Other reaction(s): Other (See Comments) Per [facility]; nature of intolerance unclear        Medication List     STOP taking these medications    ascorbic acid 500 MG tablet Commonly known as: VITAMIN C   cephALEXin 500 MG capsule Commonly known as: KEFLEX   meloxicam 15 MG tablet Commonly known as: MOBIC   thiamine 100 MG tablet Commonly known as: VITAMIN B1       TAKE these medications    acetaminophen 325 MG tablet Commonly known as: TYLENOL Take 650 mg by mouth every 6 (six) hours as needed for mild pain, headache or fever.   benztropine 0.5 MG tablet Commonly known as: COGENTIN Take 0.5 mg by mouth every morning.   benztropine 1 MG tablet Commonly known as: COGENTIN Take 1 mg by mouth 2 (two) times daily.   cholecalciferol 25 MCG (1000 UNIT) tablet Commonly known as: VITAMIN D3 Take 2,000 Units by mouth daily.   divalproex 500 MG DR tablet Commonly known as: DEPAKOTE Take 1,000 mg by mouth 2 (two) times daily. What changed: Another medication with the same name was removed. Continue taking this medication, and follow the directions you see here.   levothyroxine 25 MCG tablet Commonly known as: SYNTHROID Take 25 mcg by mouth daily before breakfast.   LORazepam 1 MG tablet Commonly known as: ATIVAN Take 1 tablet (1 mg total) by mouth 3 (three) times daily as needed for anxiety. What changed:  when to take this Another medication with the same name was removed. Continue taking this medication, and follow the directions you see here.   OLANZapine 5 MG tablet Commonly known as: ZYPREXA Take 1.5 tablets (7.5 mg total) by mouth every 12 (twelve) hours. 5 mg 0800 and 1400 What changed:  how much to take when to take this   paliperidone 9 MG 24 hr tablet Commonly known as: INVEGA Take 9 mg by mouth at bedtime. What changed: Another medication with the same name was removed. Continue taking this medication, and follow the directions you  see here.   propranolol 20 MG tablet Commonly known as: INDERAL Take 20 mg by mouth 2 (two) times daily.   sertraline 100 MG tablet Commonly known as: ZOLOFT Take 1 tablet by mouth twice daily - at 8am AND at 8pm   solifenacin 5 MG tablet Commonly known as: VESICARE Take 5 mg by mouth at bedtime.   tamsulosin 0.4 MG Caps capsule Commonly known as: FLOMAX Take 1 capsule (0.4 mg total) by mouth daily. Start taking on: December 19, 2022   vitamin B-12 100 MCG tablet Commonly known as: CYANOCOBALAMIN Take 100 mcg by mouth daily.               Discharge Care Instructions  (From admission, onward)           Start     Ordered   12/18/22 0000  Discharge wound care:       Comments: Follow with RN   12/18/22 0935            Contact information for follow-up providers     Latanya Maudlin, NP Follow up in 1 week(s).   Specialty:  Family Medicine Contact information: [place] [address] [phone]              Contact information for after-discharge care     Destination     [facility] Preferred SNF .   Service: Skilled Nursing Contact information: [address] [place] [phone]                    Discharge Exam: Danley Danker Weights   12/06/22 0924  Weight: 104.3 kg   General exam: Appears calm and comfortable  Respiratory system: Clear to auscultation. Respiratory effort normal. Cardiovascular system: S1 & S2 heard, RRR. No JVD, murmurs, rubs, gallops or clicks. No pedal edema. Gastrointestinal system: Abdomen is nondistended, soft and nontender. No organomegaly or masses felt. Normal bowel sounds heard. Central nervous system: Alert and oriented x3. No focal neurological deficits. Extremities: Symmetric 5 x 5 power. Skin: No rashes, lesions or ulcers Psychiatry: Judgement and insight appear normal. Mood & affect appropriate.    Condition at discharge: good  The  results of significant diagnostics from this hospitalization (including imaging, microbiology, ancillary and laboratory) are listed below for reference.   Imaging Studies: DG Chest Port 1 View  Result Date: 12/10/2022 CLINICAL DATA:  Acute respiratory distress. EXAM: PORTABLE CHEST 1 VIEW COMPARISON:  Chest radiograph dated 12/09/2022. FINDINGS: Shallow inspiration. Left lung base density, likely atelectasis. Infiltrate is less likely but not excluded. The right lung is clear. No large pleural effusion or pneumothorax. Stable cardiomegaly. No acute osseous pathology. IMPRESSION: Left lung base density, likely atelectasis. Infiltrate is less likely but not excluded. Electronically Signed   By: Anner Crete M.D.   On: 12/10/2022 02:43   DG Chest Port 1 View  Result Date: 12/09/2022 CLINICAL DATA:  Aspiration pneumonia. EXAM: PORTABLE CHEST 1 VIEW COMPARISON:  Radiograph 12/04/2022 FINDINGS: Unchanged cardiomediastinal silhouette. There is no focal airspace consolidation. Unchanged left peripheral basilar lung scarring. There is no pleural effusion or evidence of pneumothorax. No acute osseous abnormality. Thoracic spondylosis. IMPRESSION: No evidence of acute cardiopulmonary disease. Electronically Signed   By: Maurine Simmering M.D.   On: 12/09/2022 13:43   CT Head Wo Contrast  Result Date: 12/06/2022 CLINICAL DATA:  Fall at home. Mental status change with unknown cause EXAM: CT HEAD WITHOUT CONTRAST TECHNIQUE: Contiguous axial images were obtained from the base of the skull through the vertex without intravenous contrast. RADIATION DOSE REDUCTION: This exam was performed according to the departmental dose-optimization program which includes automated exposure control, adjustment of the mA and/or kV according to patient size and/or use of iterative reconstruction technique. COMPARISON:  09/10/2019 FINDINGS: Brain: No evidence of acute infarction, hemorrhage, hydrocephalus, extra-axial collection or mass  lesion/mass effect. Generalized brain atrophy, especially for age. Vascular: No hyperdense vessel or unexpected calcification. Skull: Normal. Negative for fracture or focal lesion. Sinuses/Orbits: Patchy bilateral sinus opacification that is progressed. Obstructing cerumen at the right ear. IMPRESSION: 1. No acute finding. 2. Premature brain atrophy. 3. Generalized sinusitis that is progressed from 2020. Electronically Signed   By: Jorje Guild M.D.   On: 12/06/2022 11:23   DG Knee Complete 4 Views Right  Result Date: 12/04/2022 CLINICAL DATA:  RIGHT knee pain, fell yesterday EXAM: RIGHT KNEE - COMPLETE 4+ VIEW COMPARISON:  09/10/2019 FINDINGS: Osseous demineralization. Scattered joint space narrowing greatest at patellofemoral joint. No acute fracture, dislocation, or bone destruction. No joint effusion. IMPRESSION: Osseous demineralization with mild degenerative changes RIGHT knee. No acute abnormalities. Electronically  Signed   By: Lavonia Dana M.D.   On: 12/04/2022 12:14   US Venous Img Lower Right (DVT Study)  Result Date: 12/04/2022 CLINICAL DATA:  Right leg swelling. EXAM: RIGHT LOWER EXTREMITY VENOUS DOPPLER ULTRASOUND TECHNIQUE: Gray-scale sonography with compression, as well as color and duplex ultrasound, were performed to evaluate the deep venous system(s) from the level of the common femoral vein through the popliteal and proximal calf veins. COMPARISON:  July 18, 2021 FINDINGS: VENOUS Normal compressibility of the common femoral, superficial femoral, and popliteal veins, as well as the visualized calf veins. Visualized portions of profunda femoral vein and great saphenous vein unremarkable. No filling defects to suggest DVT on grayscale or color Doppler imaging. Doppler waveforms show normal direction of venous flow, normal respiratory plasticity and response to augmentation. Limited views of the contralateral common femoral vein are unremarkable. OTHER None. Limitations: None. IMPRESSION:  Negative. Electronically Signed   By: Titus Dubin M.D.   On: 12/04/2022 12:06   DG Chest 2 View  Result Date: 12/04/2022 CLINICAL DATA:  Leg swelling. EXAM: CHEST - 2 VIEW COMPARISON:  Chest x-ray dated December 29, 2020. FINDINGS: The heart size and mediastinal contours are within normal limits. Normal pulmonary vascularity. Chronic low lung volumes with mild linear scarring at the left lung base. No focal consolidation, pleural effusion, or pneumothorax. No acute osseous abnormality. IMPRESSION: 1. No acute cardiopulmonary disease. Electronically Signed   By: Titus Dubin M.D.   On: 12/04/2022 10:39   DG Foot Complete Right  Result Date: 12/04/2022 CLINICAL DATA:  Leg swelling.  Status post fall. EXAM: RIGHT FOOT COMPLETE - 3+ VIEW COMPARISON:  03/27/2021 FINDINGS: There is marked dorsal soft tissue edema. There is an acute no additional fracture or dislocation identified. Fracture involving the medial base of the first proximal phalanx with fracture line extending into the first MTP joint. IMPRESSION: Acute fracture involves the medial base of the first proximal phalanx with fracture line extending into the first MTP joint. Electronically Signed   By: Kerby Moors M.D.   On: 12/04/2022 10:38    Microbiology: Results for orders placed or performed during the hospital encounter of 12/06/22  Resp panel by RT-PCR (RSV, Flu A&B, Covid) Anterior Nasal Swab     Status: None   Collection Time: 12/06/22 11:40 AM   Specimen: Anterior Nasal Swab  Result Value Ref Range Status   SARS Coronavirus 2 by RT PCR NEGATIVE NEGATIVE Final    Comment: (NOTE) SARS-CoV-2 target nucleic acids are NOT DETECTED.  The SARS-CoV-2 RNA is generally detectable in upper respiratory specimens during the acute phase of infection. The lowest concentration of SARS-CoV-2 viral copies this assay can detect is 138 copies/mL. A negative result does not preclude SARS-Cov-2 infection and should not be used as the sole  basis for treatment or other patient management decisions. A negative result may occur with  improper specimen collection/handling, submission of specimen other than nasopharyngeal swab, presence of viral mutation(s) within the areas targeted by this assay, and inadequate number of viral copies(<138 copies/mL). A negative result must be combined with clinical observations, patient history, and epidemiological information. The expected result is Negative.  Fact Sheet for Patients:  EntrepreneurPulse.com.au  Fact Sheet for Healthcare Providers:  IncredibleEmployment.be  This test is no t yet approved or cleared by the [place] FDA and  has been authorized for detection and/or diagnosis of SARS-CoV-2 by FDA under an Emergency Use Authorization (EUA). This EUA will remain  in effect (meaning this test  can be used) for the duration of the COVID-19 declaration under Section 564(b)(1) of the Act, 21 U.S.C.section 360bbb-3(b)(1), unless the authorization is terminated  or revoked sooner.       Influenza A by PCR NEGATIVE NEGATIVE Final   Influenza B by PCR NEGATIVE NEGATIVE Final    Comment: (NOTE) The Xpert Xpress SARS-CoV-2/FLU/RSV plus assay is intended as an aid in the diagnosis of influenza from Nasopharyngeal swab specimens and should not be used as a sole basis for treatment. Nasal washings and aspirates are unacceptable for Xpert Xpress SARS-CoV-2/FLU/RSV testing.  Fact Sheet for Patients: EntrepreneurPulse.com.au  Fact Sheet for Healthcare Providers: IncredibleEmployment.be  This test is not yet approved or cleared by the [place] FDA and has been authorized for detection and/or diagnosis of SARS-CoV-2 by FDA under an Emergency Use Authorization (EUA). This EUA will remain in effect (meaning this test can be used) for the duration of the COVID-19 declaration under Section 564(b)(1) of the Act,  21 U.S.C. section 360bbb-3(b)(1), unless the authorization is terminated or revoked.     Resp Syncytial Virus by PCR NEGATIVE NEGATIVE Final    Comment: (NOTE) Fact Sheet for Patients: EntrepreneurPulse.com.au  Fact Sheet for Healthcare Providers: IncredibleEmployment.be  This test is not yet approved or cleared by the [place] FDA and has been authorized for detection and/or diagnosis of SARS-CoV-2 by FDA under an Emergency Use Authorization (EUA). This EUA will remain in effect (meaning this test can be used) for the duration of the COVID-19 declaration under Section 564(b)(1) of the Act, 21 U.S.C. section 360bbb-3(b)(1), unless the authorization is terminated or revoked.  Performed at [facility], [address] [place], [place] [address]   Culture, blood (Routine X 2) w Reflex to ID Panel     Status: None   Collection Time: 12/10/22  4:09 AM   Specimen: BLOOD  Result Value Ref Range Status   Specimen Description BLOOD BLOOD LEFT HAND  Final   Special Requests   Final    BOTTLES DRAWN AEROBIC AND ANAEROBIC Blood Culture adequate volume   Culture   Final    NO GROWTH 5 DAYS Performed at [facility], [address] [place], [place] [address]    Report Status 12/15/2022 FINAL  Final  Culture, blood (Routine X 2) w Reflex to ID Panel     Status: None   Collection Time: 12/10/22  4:09 AM   Specimen: BLOOD  Result Value Ref Range Status   Specimen Description BLOOD BLOOD LEFT ARM  Final   Special Requests   Final    BOTTLES DRAWN AEROBIC AND ANAEROBIC Blood Culture adequate volume   Culture   Final    NO GROWTH 5 DAYS Performed at [facility], [address] [place],  [address]    Report Status 12/15/2022 FINAL  Final  Resp panel by RT-PCR (RSV, Flu A&B, Covid) Anterior Nasal Swab     Status: None   Collection Time: 12/10/22  4:32 AM   Specimen: Anterior Nasal Swab  Result Value Ref  Range Status   SARS Coronavirus 2 by RT PCR NEGATIVE NEGATIVE Final    Comment: (NOTE) SARS-CoV-2 target nucleic acids are NOT DETECTED.  The SARS-CoV-2 RNA is generally detectable in upper respiratory specimens during the acute phase of infection. The lowest concentration of SARS-CoV-2 viral copies this assay can detect is 138 copies/mL. A negative result does not preclude SARS-Cov-2 infection and should not be used as the sole basis for treatment or other  patient management decisions. A negative result may occur with  improper specimen collection/handling, submission of specimen other than nasopharyngeal swab, presence of viral mutation(s) within the areas targeted by this assay, and inadequate number of viral copies(<138 copies/mL). A negative result must be combined with clinical observations, patient history, and epidemiological information. The expected result is Negative.  Fact Sheet for Patients:  EntrepreneurPulse.com.au  Fact Sheet for Healthcare Providers:  IncredibleEmployment.be  This test is no t yet approved or cleared by the [place] FDA and  has been authorized for detection and/or diagnosis of SARS-CoV-2 by FDA under an Emergency Use Authorization (EUA). This EUA will remain  in effect (meaning this test can be used) for the duration of the COVID-19 declaration under Section 564(b)(1) of the Act, 21 U.S.C.section 360bbb-3(b)(1), unless the authorization is terminated  or revoked sooner.       Influenza A by PCR NEGATIVE NEGATIVE Final   Influenza B by PCR NEGATIVE NEGATIVE Final    Comment: (NOTE) The Xpert Xpress SARS-CoV-2/FLU/RSV plus assay is intended as an aid in the diagnosis of influenza from Nasopharyngeal swab specimens and should not be used as a sole basis for treatment. Nasal washings and aspirates are unacceptable for Xpert Xpress SARS-CoV-2/FLU/RSV testing.  Fact Sheet for  Patients: EntrepreneurPulse.com.au  Fact Sheet for Healthcare Providers: IncredibleEmployment.be  This test is not yet approved or cleared by the [place] FDA and has been authorized for detection and/or diagnosis of SARS-CoV-2 by FDA under an Emergency Use Authorization (EUA). This EUA will remain in effect (meaning this test can be used) for the duration of the COVID-19 declaration under Section 564(b)(1) of the Act, 21 U.S.C. section 360bbb-3(b)(1), unless the authorization is terminated or revoked.     Resp Syncytial Virus by PCR NEGATIVE NEGATIVE Final    Comment: (NOTE) Fact Sheet for Patients: EntrepreneurPulse.com.au  Fact Sheet for Healthcare Providers: IncredibleEmployment.be  This test is not yet approved or cleared by the [place] FDA and has been authorized for detection and/or diagnosis of SARS-CoV-2 by FDA under an Emergency Use Authorization (EUA). This EUA will remain in effect (meaning this test can be used) for the duration of the COVID-19 declaration under Section 564(b)(1) of the Act, 21 U.S.C. section 360bbb-3(b)(1), unless the authorization is terminated or revoked.  Performed at [facility], [address] [place], [place] [address]   MRSA Next Gen by PCR, Nasal     Status: None   Collection Time: 12/10/22  4:33 AM   Specimen: Nasal Mucosa; Nasal Swab  Result Value Ref Range Status   MRSA by PCR Next Gen NOT DETECTED NOT DETECTED Final    Comment: (NOTE) The GeneXpert MRSA Assay (FDA approved for NASAL specimens only), is one component of a comprehensive MRSA colonization surveillance program. It is not intended to diagnose MRSA infection nor to guide or monitor treatment for MRSA infections. Test performance is not FDA approved in patients less than 25 years old. Performed at [facility], [address], [place], [place] [address]   Urine  Culture     Status: None   Collection Time: 12/15/22  5:36 PM   Specimen: Urine, Clean Catch  Result Value Ref Range Status   Specimen Description   Final    URINE, CLEAN CATCH Performed at [facility], [address], [place], [place] [address]    Special Requests   Final    NONE Performed at [facility], [address], [place], [place] [address]  Culture   Final    NO GROWTH Performed at [facility], [place] [address] [place], [place] [address]    Report Status 12/16/2022 FINAL  Final    Labs: CBC: Recent Labs  Lab 12/16/22 0440  WBC 3.9*  HGB 13.2  HCT 40.0  MCV 97.8  PLT 295*   Basic Metabolic Panel: Recent Labs  Lab 12/16/22 0440  NA 143  K 4.2  CL 105  CO2 32  GLUCOSE 98  BUN 19  CREATININE 0.84  CALCIUM 9.1  MG 2.3   Liver Function Tests: No results for input(s): "AST", "ALT", "ALKPHOS", "BILITOT", "PROT", "ALBUMIN" in the last 168 hours. CBG: No results for input(s): "GLUCAP" in the last 168 hours.  Discharge time spent: greater than 30 minutes.  Signed: Sharen Hones, MD [facility] 12/18/2022

## 2023-02-08 IMAGING — US US ABDOMEN COMPLETE
1 series · 14 of 25 positions shown · non-contrast
Comparison: April 26, 2021

CLINICAL DATA: Thrombocytopenia.  Hepatosplenomegaly.

EXAM:
ABDOMEN ULTRASOUND COMPLETE

[Series 1: us abdomen complete · 0.33mm/px · 14 of 82 slices shown]
[im 1/82]
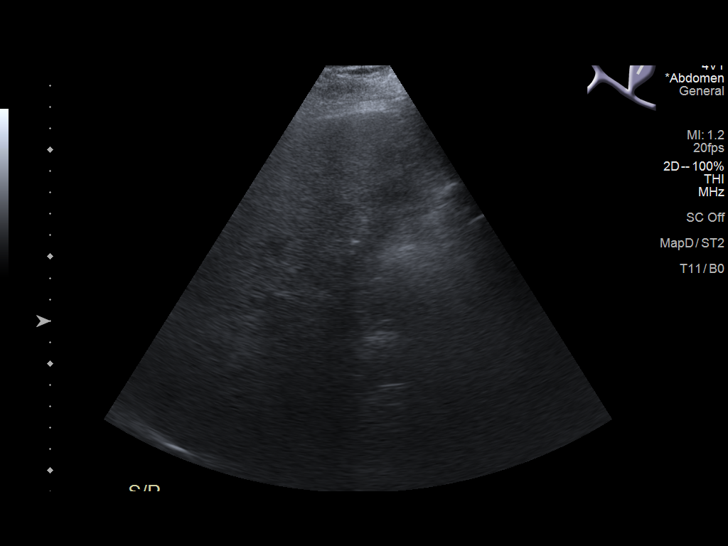
[im 7/82]
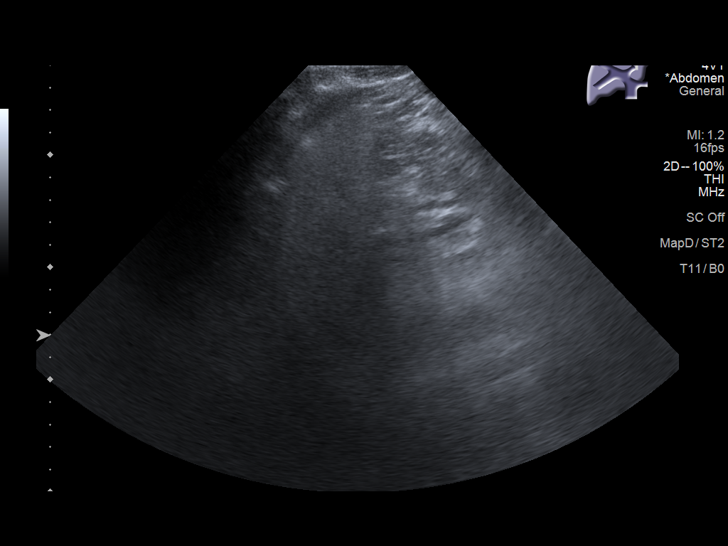
[im 14/82]
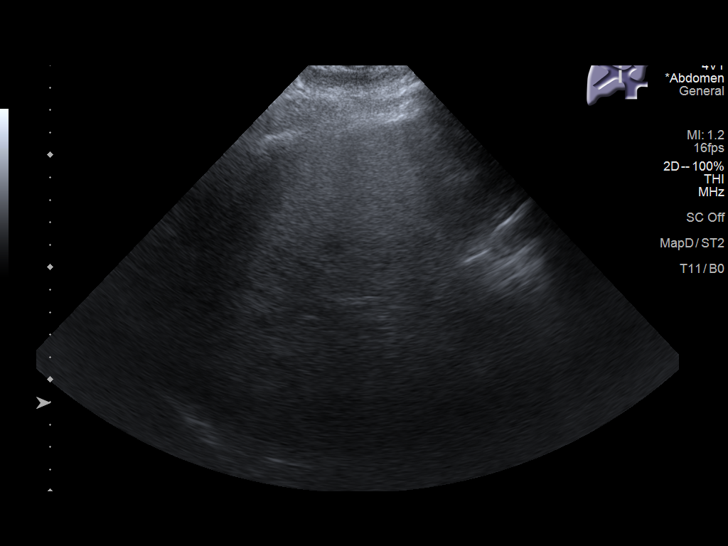
[im 21/82]
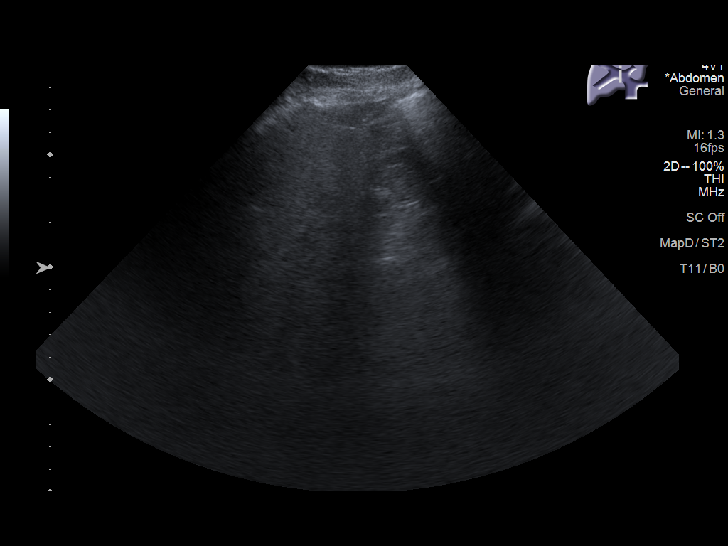
[im 28/82]
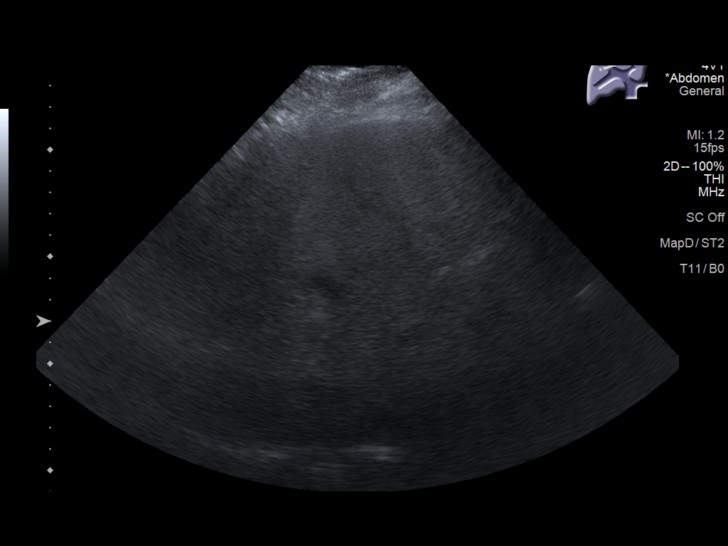
[im 31/82]
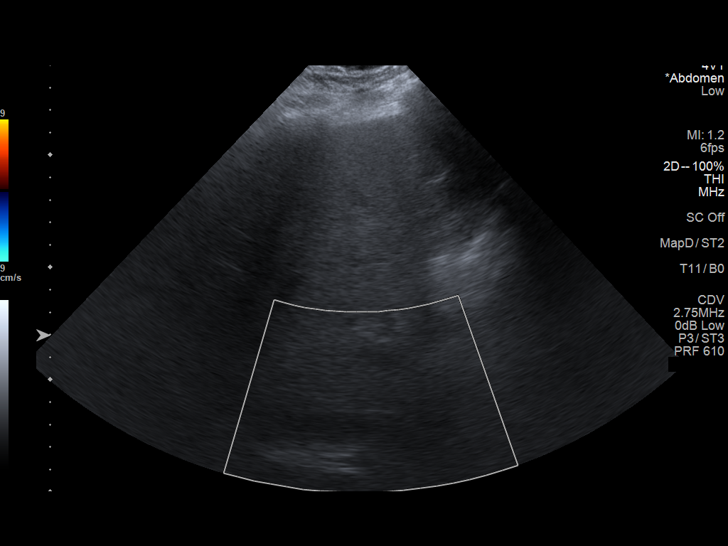
[im 38/82]
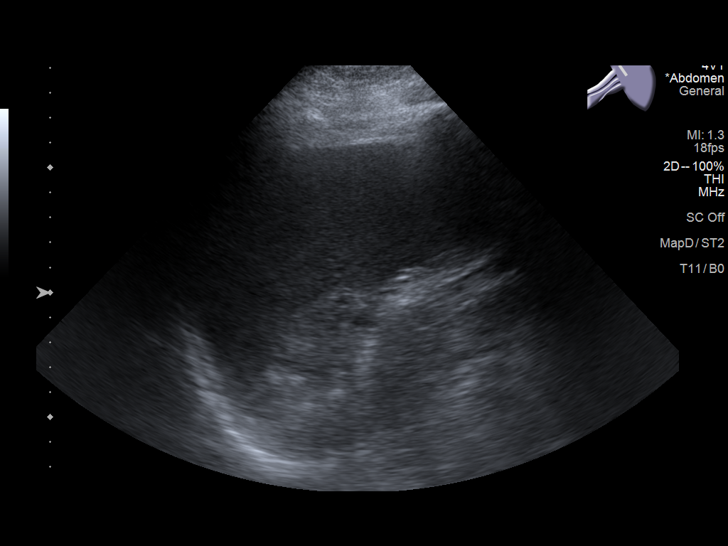
[im 44/82]
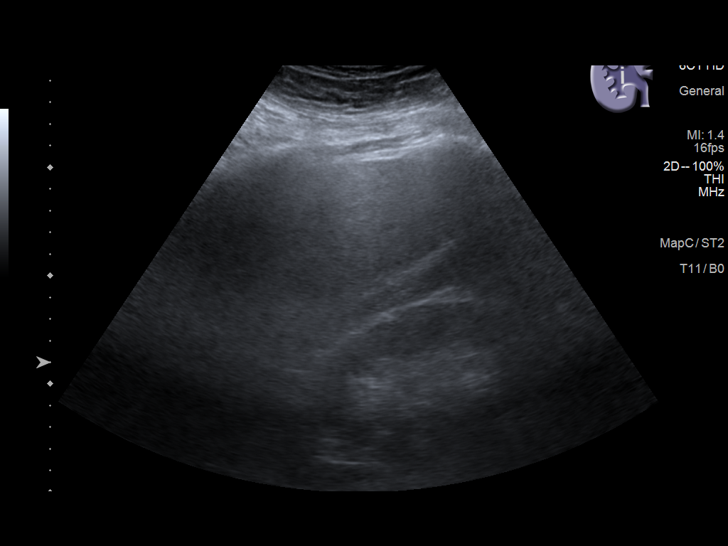
[im 51/82]
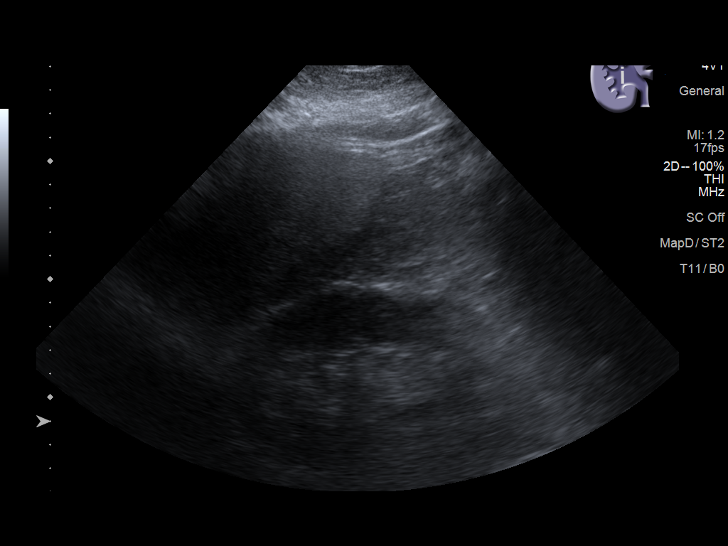
[im 55/82]
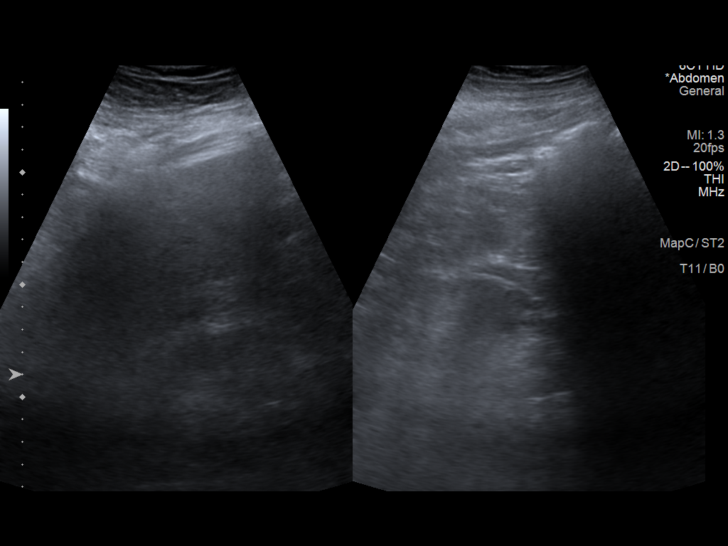
[im 61/82]
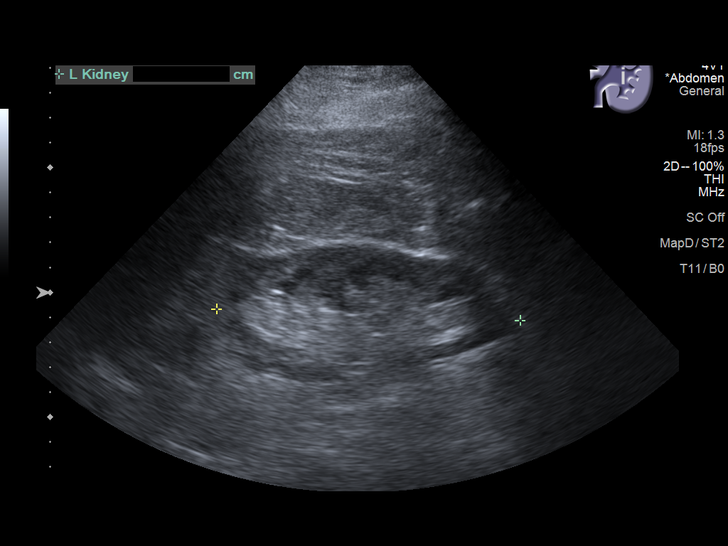
[im 68/82]
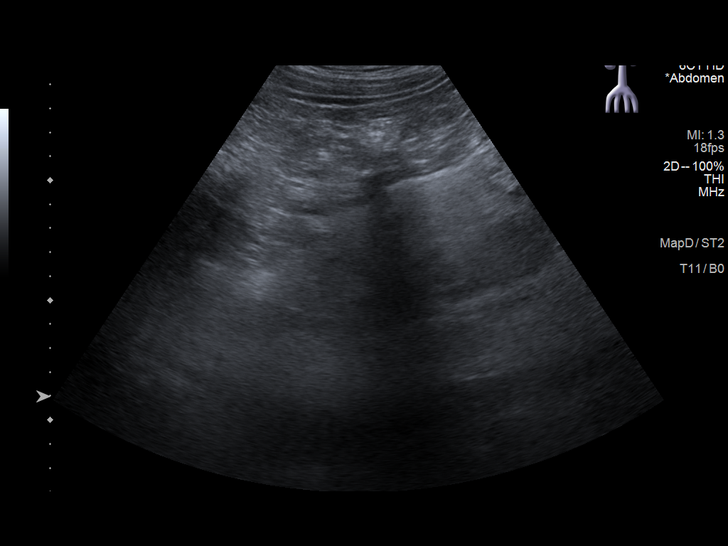
[im 75/82]
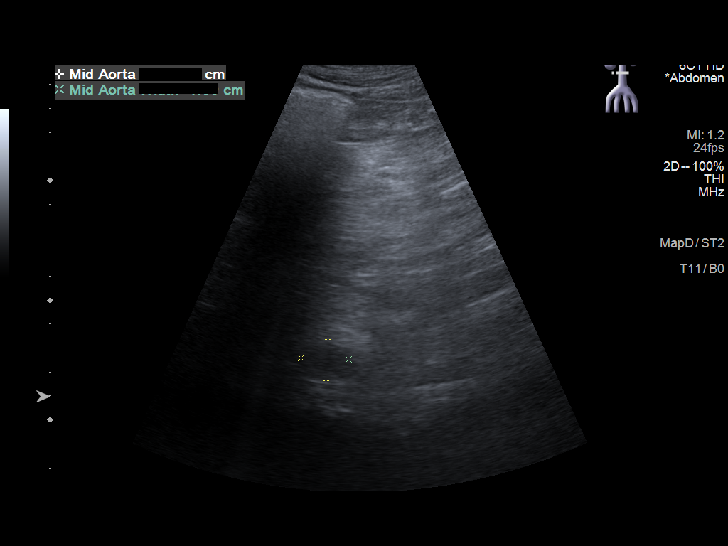
[im 82/82]
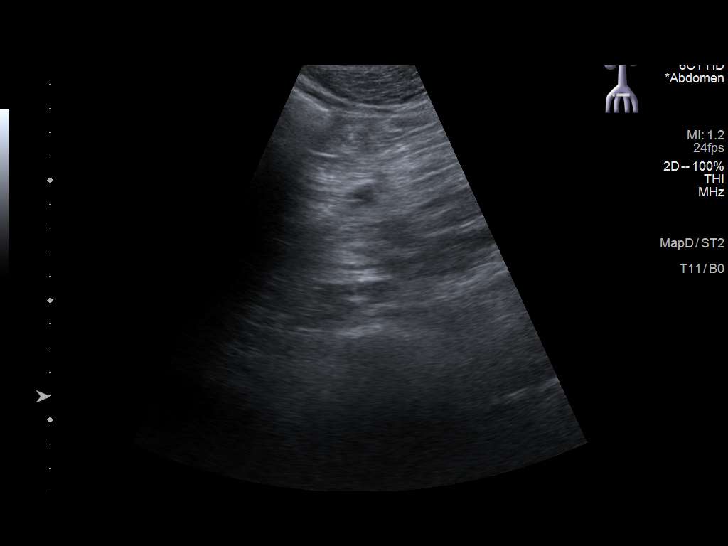

[14 of 25 positions shown; findings below may reference images not displayed]

FINDINGS: Gallbladder: Surgically absent

Common bile duct: Diameter: 4 mm

Liver: Diffuse increased echogenicity. No focal mass. Portal vein is
patent on color Doppler imaging with normal direction of blood flow
towards the liver.

IVC: No abnormality visualized.

Pancreas: Visualized portion unremarkable.

Spleen: The splenic length is 13.1 cm. The splenic volume is 616 cc
today versus 543 cc previously.

Right Kidney: Length: 11.0 cm. Echogenicity within normal limits. No
mass or hydronephrosis visualized.

Left Kidney: Length: 12.2 cm. Echogenicity within normal limits. No
mass or hydronephrosis visualized.

Abdominal aorta: No aneurysm visualized.

Other findings: None.
IMPRESSION: 1. Diffuse increased echogenicity throughout the liver is
nonspecific but often due to hepatic steatosis.
2. Splenomegaly with a volume of 616 cc today versus 543 cc
previously.
3. Previous cholecystectomy.

## 2023-06-06 ENCOUNTER — Other Ambulatory Visit: Payer: Self-pay

## 2023-06-06 DIAGNOSIS — D696 Thrombocytopenia, unspecified: Secondary | ICD-10-CM

## 2023-06-07 ENCOUNTER — Inpatient Hospital Stay: Payer: Medicare Other

## 2023-06-07 ENCOUNTER — Encounter: Payer: Self-pay | Admitting: Nurse Practitioner

## 2023-06-07 ENCOUNTER — Inpatient Hospital Stay: Payer: Medicare Other | Attending: Nurse Practitioner | Admitting: Nurse Practitioner

## 2023-06-07 VITALS — BP 95/44 | HR 75 | Temp 98.5°F

## 2023-06-07 DIAGNOSIS — K760 Fatty (change of) liver, not elsewhere classified: Secondary | ICD-10-CM | POA: Diagnosis not present

## 2023-06-07 DIAGNOSIS — F209 Schizophrenia, unspecified: Secondary | ICD-10-CM | POA: Insufficient documentation

## 2023-06-07 DIAGNOSIS — I10 Essential (primary) hypertension: Secondary | ICD-10-CM | POA: Insufficient documentation

## 2023-06-07 DIAGNOSIS — Z79890 Hormone replacement therapy (postmenopausal): Secondary | ICD-10-CM | POA: Diagnosis not present

## 2023-06-07 DIAGNOSIS — R162 Hepatomegaly with splenomegaly, not elsewhere classified: Secondary | ICD-10-CM

## 2023-06-07 DIAGNOSIS — D696 Thrombocytopenia, unspecified: Secondary | ICD-10-CM

## 2023-06-07 DIAGNOSIS — Z79899 Other long term (current) drug therapy: Secondary | ICD-10-CM | POA: Insufficient documentation

## 2023-06-07 LAB — IRON AND TIBC
Iron: 138 ug/dL (ref 45–182)
Saturation Ratios: 47 % — ABNORMAL HIGH (ref 17.9–39.5)
TIBC: 291 ug/dL (ref 250–450)
UIBC: 153 ug/dL

## 2023-06-07 LAB — COMPREHENSIVE METABOLIC PANEL
ALT: 7 U/L (ref 0–44)
AST: 12 U/L — ABNORMAL LOW (ref 15–41)
Albumin: 3.1 g/dL — ABNORMAL LOW (ref 3.5–5.0)
Alkaline Phosphatase: 38 U/L (ref 38–126)
Anion gap: 10 (ref 5–15)
BUN: 15 mg/dL (ref 6–20)
CO2: 31 mmol/L (ref 22–32)
Calcium: 8.9 mg/dL (ref 8.9–10.3)
Chloride: 100 mmol/L (ref 98–111)
Creatinine, Ser: 0.7 mg/dL (ref 0.61–1.24)
GFR, Estimated: >60 mL/min (ref >60)
Glucose, Bld: 108 mg/dL — ABNORMAL HIGH (ref 70–99)
Potassium: 3.4 mmol/L — ABNORMAL LOW (ref 3.5–5.1)
Sodium: 141 mmol/L (ref 135–145)
Total Bilirubin: 0.6 mg/dL (ref 0.3–1.2)
Total Protein: 6.8 g/dL (ref 6.5–8.1)

## 2023-06-07 LAB — CBC WITH DIFFERENTIAL/PLATELET
Abs Immature Granulocytes: 0.05 10*3/uL (ref 0.00–0.07)
Basophils Absolute: 0 10*3/uL (ref 0.0–0.1)
Basophils Relative: 1 %
Eosinophils Absolute: 0.2 10*3/uL (ref 0.0–0.5)
Eosinophils Relative: 6 %
HCT: 40.3 % (ref 39.0–52.0)
Hemoglobin: 13.6 g/dL (ref 13.0–17.0)
Immature Granulocytes: 2 %
Lymphocytes Relative: 35 %
Lymphs Abs: 1 10*3/uL (ref 0.7–4.0)
MCH: 32.9 pg (ref 26.0–34.0)
MCHC: 33.7 g/dL (ref 30.0–36.0)
MCV: 97.6 fL (ref 80.0–100.0)
Monocytes Absolute: 0.3 10*3/uL (ref 0.1–1.0)
Monocytes Relative: 12 %
Neutro Abs: 1.3 10*3/uL — ABNORMAL LOW (ref 1.7–7.7)
Neutrophils Relative %: 44 %
Platelets: 62 10*3/uL — ABNORMAL LOW (ref 150–400)
RBC: 4.13 MIL/uL — ABNORMAL LOW (ref 4.22–5.81)
RDW: 13.8 % (ref 11.5–15.5)
Smear Review: NORMAL
WBC: 2.9 10*3/uL — ABNORMAL LOW (ref 4.0–10.5)
nRBC: 0 % (ref 0.0–0.2)

## 2023-06-07 LAB — FERRITIN: Ferritin: 493 ng/mL — ABNORMAL HIGH (ref 24–336)

## 2023-06-07 NOTE — Progress Notes (Signed)
Hematology/Oncology Consult Note [facility]  [phone] [phone]  Patient Care Team: Luciana Axe, NP as PCP - General (Family Medicine) Creig Hines, MD as Consulting Physician (Hematology and Oncology)   Name of the patient: Ethan Alvarez  [id number]  1961-05-08   Date of visit: 06/07/23  Diagnosis-thrombocytopenia possibly secondary to splenomegaly Hepatosplenomegaly of unclear etiology  Chief complaint/ Reason for visit- reestablish care for thrombocytopenia  Heme/Onc history: Patient is a 62 year old male with a past medical history significant for schizophrenia who has been seeing [place] for chronic thrombocytopenia.  Patient unable to make any of his own decisions and his brother is his primary Management consultant.  He is cared for by a CMA for the last 3 years.  Patient noted to have chronic hepatosplenomegaly on his prior CTA since 2020.  Liver function tests have been normal and there was evidence of fatty liver but no overt cirrhosis.  HIV hepatitis C B12 folate copper and iron studies were normal.  Interval history- Patient is unable to provide history. He is accompanied by his sister who is also caregiver. She notices decline in his mobility and cognition. No specific complaints today. No bleeding or bruising. She and her brother would like to remain conservative with his care. He is now with [facility].   ECOG PS- 3 Pain scale- 0   Review of systems- Review of Systems  Unable to perform ROS: Psychiatric disorder    Allergies  Allergen Reactions   Haloperidol Lactate Other (See Comments)    Per [facility]; nature of intolerance unclear intolerance Other reaction(s): Other (See Comments) Per [facility]; nature of intolerance unclear     Past Medical History:  Diagnosis Date   Hypertension    Schizophrenia [facility])     Past Surgical History:  Procedure Laterality Date   INCISION AND DRAINAGE Right 06/02/2019   Procedure:  INCISION AND DRAINAGE;  Surgeon: Christena Flake, MD;  Location: [facility] ORS;  Service: Orthopedics;  Laterality: Right;   QUADRICEPS TENDON REPAIR Right 04/25/2019   Procedure: REPAIR QUADRICEP TENDON;  Surgeon: Christena Flake, MD;  Location: [facility] ORS;  Service: Orthopedics;  Laterality: Right;   QUADRICEPS TENDON REPAIR Right 06/02/2019   Procedure: REPAIR QUADRICEP TENDON;  Surgeon: Christena Flake, MD;  Location: [facility] ORS;  Service: Orthopedics;  Laterality: Right;    Social History   Socioeconomic History   Marital status: Single    Spouse name: Not on file   Number of children: Not on file   Years of education: Not on file   Highest education level: Not on file  Occupational History   Not on file  Tobacco Use   Smoking status: Never   Smokeless tobacco: Never  Vaping Use   Vaping Use: Never used  Substance and Sexual Activity   Alcohol use: Not Currently   Drug use: Not Currently   Sexual activity: Not Currently  Other Topics Concern   Not on file  Social History Narrative   Not on file   Social Determinants of Health   Financial Resource Strain: Not on file  Food Insecurity: No Food Insecurity (12/07/2022)   Hunger Vital Sign    Worried About Running Out of Food in the Last Year: Never true    Ran Out of Food in the Last Year: Never true  Transportation Needs: No Transportation Needs (12/07/2022)   PRAPARE - Administrator, Civil Service (Medical): No    Lack of Transportation (Non-Medical): No  Physical Activity: Not on file  Stress: Not on file  Social Connections: Not on file  Intimate Partner Violence: Not At Risk (12/07/2022)   Humiliation, Afraid, Rape, and Kick questionnaire    Fear of Current or Ex-Partner: No    Emotionally Abused: No    Physically Abused: No    Sexually Abused: No    History reviewed. No pertinent family history.   Current Outpatient Medications:    acetaminophen (TYLENOL) 325 MG tablet, Take 650 mg by mouth every 6 (six)  hours as needed for mild pain, headache or fever., Disp: , Rfl:    ascorbic acid (VITAMIN C) 250 MG tablet, Take by mouth., Disp: , Rfl:    benztropine (COGENTIN) 0.5 MG tablet, Take 0.5 mg by mouth every morning. , Disp: , Rfl:    benztropine (COGENTIN) 1 MG tablet, Take 1 mg by mouth 2 (two) times daily. , Disp: , Rfl:    cholecalciferol (VITAMIN D3) 25 MCG (1000 UT) tablet, Take 2,000 Units by mouth daily., Disp: , Rfl:    divalproex (DEPAKOTE) 500 MG DR tablet, Take 1,000 mg by mouth 2 (two) times daily., Disp: , Rfl:    INVEGA SUSTENNA 156 MG/ML SUSY injection, Inject into the muscle., Disp: , Rfl:    levothyroxine (SYNTHROID) 25 MCG tablet, Take 25 mcg by mouth daily before breakfast., Disp: , Rfl:    LORazepam (ATIVAN) 1 MG tablet, Take 1 tablet (1 mg total) by mouth 3 (three) times daily as needed for anxiety., Disp: 12 tablet, Rfl: 0   OLANZapine (ZYPREXA) 5 MG tablet, Take 1.5 tablets (7.5 mg total) by mouth every 12 (twelve) hours. 5 mg 0800 and 1400, Disp: , Rfl:    paliperidone (INVEGA) 9 MG 24 hr tablet, Take 9 mg by mouth at bedtime., Disp: , Rfl:    propranolol (INDERAL) 20 MG tablet, Take 20 mg by mouth 2 (two) times daily., Disp: , Rfl:    sertraline (ZOLOFT) 100 MG tablet, Take 1 tablet by mouth twice daily - at 8am AND at 8pm, Disp: , Rfl:    solifenacin (VESICARE) 5 MG tablet, Take 5 mg by mouth at bedtime., Disp: , Rfl:    tamsulosin (FLOMAX) 0.4 MG CAPS capsule, Take 1 capsule (0.4 mg total) by mouth daily., Disp: 30 capsule, Rfl:    vitamin B-12 (CYANOCOBALAMIN) 100 MCG tablet, Take 100 mcg by mouth daily., Disp: , Rfl:   Physical exam:  Vitals:   06/07/23 1108  BP: (!) 95/44  Pulse: 75  Temp: 98.5 F (36.9 C)  TempSrc: Tympanic  SpO2: 100%   Physical Exam Constitutional:      Comments: In wheelchair. Lift straps underneath.   Cardiovascular:     Rate and Rhythm: Normal rate.  Pulmonary:     Effort: Pulmonary effort is normal.  Abdominal:     Tenderness:  There is no abdominal tenderness.  Skin:    General: Skin is warm and dry.  Neurological:     Mental Status: He is alert. Mental status is at baseline.  Psychiatric:        Mood and Affect: Mood normal.        Speech: Speech is tangential.        Behavior: Behavior is cooperative.        Cognition and Memory: Cognition is impaired. Memory is impaired.        Latest Ref Rng & Units 06/07/2023   10:29 AM  CMP  Glucose 70 - 99 mg/dL 253  BUN 6 - 20 mg/dL 15   Creatinine 4.69 - 1.24 mg/dL 6.29   Sodium 528 - 413 mmol/L 141   Potassium 3.5 - 5.1 mmol/L 3.4   Chloride 98 - 111 mmol/L 100   CO2 22 - 32 mmol/L 31   Calcium 8.9 - 10.3 mg/dL 8.9   Total Protein 6.5 - 8.1 g/dL 6.8   Total Bilirubin 0.3 - 1.2 mg/dL 0.6   Alkaline Phos 38 - 126 U/L 38   AST 15 - 41 U/L 12   ALT 0 - 44 U/L 7       Latest Ref Rng & Units 06/07/2023   10:29 AM  CBC  WBC 4.0 - 10.5 K/uL 2.9   Hemoglobin 13.0 - 17.0 g/dL 24.4   Hematocrit 01.0 - 52.0 % 40.3   Platelets 150 - 400 K/uL 62      Assessment and plan- Patient is a 62 y.o. male here to reestablish care for thrombocytopenia.   Previous workup was suspect that etiology was related to underlying hepatosplenomegaly. Counts have dropped to 62,000, baseline 80s-100s. We discussed option of repeating abdominal ultrasound. Previously in May 2023, spleen volume had increased which correlated with decrease in his platelet count, supporting likely etiology. Sister and brother decline additional imaging or workup and would prefer to have labs monitored at [facility] given his overall decline in cognition and performance status. I offered collaborative care with providers at [facility] who can send labs if review is warranted and could also perform virtual visits. He can return to cancer center in the future if needed or if family wishes to consider additional workup.   Follow up: Follow up as needed.   Visit Diagnosis 1. Thrombocytopenia (HCC)   2.  Hepatosplenomegaly     Consuello Masse, DNP, AGNP-C [facility] [phone] (clinic) 06/07/2023

## 2023-08-18 DEATH — deceased
